# Patient Record
Sex: Male | Born: 1935 | Race: Black or African American | Hispanic: No | Marital: Married | Smoking: Former smoker
Health system: Southern US, Community
[De-identification: ages and names within clinical notes are randomized; demographics above are authoritative.]

## PROBLEM LIST (undated history)

## (undated) DIAGNOSIS — E785 Hyperlipidemia, unspecified: Secondary | ICD-10-CM

## (undated) DIAGNOSIS — J309 Allergic rhinitis, unspecified: Secondary | ICD-10-CM

## (undated) DIAGNOSIS — N259 Disorder resulting from impaired renal tubular function, unspecified: Secondary | ICD-10-CM

## (undated) DIAGNOSIS — Z86010 Personal history of colonic polyps: Secondary | ICD-10-CM

## (undated) DIAGNOSIS — N400 Enlarged prostate without lower urinary tract symptoms: Secondary | ICD-10-CM

## (undated) DIAGNOSIS — F528 Other sexual dysfunction not due to a substance or known physiological condition: Secondary | ICD-10-CM

## (undated) DIAGNOSIS — L989 Disorder of the skin and subcutaneous tissue, unspecified: Secondary | ICD-10-CM

## (undated) DIAGNOSIS — M109 Gout, unspecified: Secondary | ICD-10-CM

## (undated) DIAGNOSIS — I10 Essential (primary) hypertension: Secondary | ICD-10-CM

## (undated) HISTORY — PX: OTHER SURGICAL HISTORY: SHX169

## (undated) HISTORY — DX: Gout, unspecified: M10.9

## (undated) HISTORY — DX: Other sexual dysfunction not due to a substance or known physiological condition: F52.8

## (undated) HISTORY — DX: Allergic rhinitis, unspecified: J30.9

## (undated) HISTORY — DX: Disorder resulting from impaired renal tubular function, unspecified: N25.9

## (undated) HISTORY — DX: Hyperlipidemia, unspecified: E78.5

## (undated) HISTORY — DX: Benign prostatic hyperplasia without lower urinary tract symptoms: N40.0

## (undated) HISTORY — DX: Personal history of colonic polyps: Z86.010

## (undated) HISTORY — DX: Essential (primary) hypertension: I10

## (undated) HISTORY — DX: Disorder of the skin and subcutaneous tissue, unspecified: L98.9

---

## 1998-03-16 ENCOUNTER — Encounter: Payer: Self-pay | Admitting: Gastroenterology

## 2000-04-30 ENCOUNTER — Other Ambulatory Visit: Admission: RE | Admit: 2000-04-30 | Discharge: 2000-04-30 | Payer: Self-pay | Admitting: Gastroenterology

## 2000-04-30 ENCOUNTER — Encounter (INDEPENDENT_AMBULATORY_CARE_PROVIDER_SITE_OTHER): Payer: Self-pay | Admitting: Specialist

## 2000-04-30 ENCOUNTER — Encounter: Payer: Self-pay | Admitting: Gastroenterology

## 2000-05-01 ENCOUNTER — Encounter: Payer: Self-pay | Admitting: Gastroenterology

## 2004-11-27 ENCOUNTER — Ambulatory Visit: Payer: Self-pay | Admitting: Internal Medicine

## 2005-05-28 ENCOUNTER — Ambulatory Visit: Payer: Self-pay | Admitting: Internal Medicine

## 2005-06-14 ENCOUNTER — Ambulatory Visit: Payer: Self-pay | Admitting: Gastroenterology

## 2005-06-28 ENCOUNTER — Ambulatory Visit: Payer: Self-pay | Admitting: Gastroenterology

## 2005-12-03 ENCOUNTER — Ambulatory Visit: Payer: Self-pay | Admitting: Internal Medicine

## 2005-12-31 ENCOUNTER — Ambulatory Visit: Payer: Self-pay | Admitting: Internal Medicine

## 2006-05-07 ENCOUNTER — Ambulatory Visit: Payer: Self-pay | Admitting: Internal Medicine

## 2006-05-07 LAB — CONVERTED CEMR LAB
Albumin: 3.3 g/dL — ABNORMAL LOW (ref 3.5–5.2)
Alkaline Phosphatase: 41 units/L (ref 39–117)
Basophils Absolute: 0 10*3/uL (ref 0.0–0.1)
CO2: 26 meq/L (ref 19–32)
Creatinine, Ser: 1.5 mg/dL (ref 0.5–1.7)
Eosinophils Absolute: 0.1 10*3/uL (ref 0.0–0.6)
GFR calc non Af Amer: 48 mL/min
Glucose, Bld: 91 mg/dL (ref 70–99)
Hemoglobin: 12.8 g/dL — ABNORMAL LOW (ref 13.0–17.0)
LDL Cholesterol: 86 mg/dL (ref 0–99)
MCHC: 34.1 g/dL (ref 30.0–36.0)
Monocytes Relative: 14.5 % — ABNORMAL HIGH (ref 3.0–11.0)
Neutro Abs: 1.5 10*3/uL (ref 1.4–7.7)
Neutrophils Relative %: 41.8 % — ABNORMAL LOW (ref 43.0–77.0)
Platelets: 124 10*3/uL — ABNORMAL LOW (ref 150–400)
RBC: 4.14 M/uL — ABNORMAL LOW (ref 4.22–5.81)
RDW: 13.9 % (ref 11.5–14.6)
Sodium: 144 meq/L (ref 135–145)
Total Bilirubin: 0.6 mg/dL (ref 0.3–1.2)
Total CHOL/HDL Ratio: 3
Triglycerides: 43 mg/dL (ref 0–149)
VLDL: 9 mg/dL (ref 0–40)

## 2006-05-14 ENCOUNTER — Ambulatory Visit: Payer: Self-pay | Admitting: Internal Medicine

## 2006-11-18 ENCOUNTER — Ambulatory Visit: Payer: Self-pay | Admitting: Internal Medicine

## 2007-05-21 ENCOUNTER — Ambulatory Visit: Payer: Self-pay | Admitting: Internal Medicine

## 2007-05-21 LAB — CONVERTED CEMR LAB
ALT: 9 units/L (ref 0–53)
Alkaline Phosphatase: 45 units/L (ref 39–117)
BUN: 21 mg/dL (ref 6–23)
Basophils Relative: 0.6 % (ref 0.0–1.0)
Bilirubin Urine: NEGATIVE
CO2: 25 meq/L (ref 19–32)
Calcium: 8.8 mg/dL (ref 8.4–10.5)
Creatinine, Ser: 1.5 mg/dL (ref 0.4–1.5)
Hemoglobin: 12.8 g/dL — ABNORMAL LOW (ref 13.0–17.0)
Leukocytes, UA: NEGATIVE
Monocytes Relative: 12.9 % — ABNORMAL HIGH (ref 3.0–11.0)
Platelets: 132 10*3/uL — ABNORMAL LOW (ref 150–400)
RDW: 14.1 % (ref 11.5–14.6)
Specific Gravity, Urine: 1.025 (ref 1.000–1.03)
Total Bilirubin: 0.6 mg/dL (ref 0.3–1.2)
Total Protein, Urine: NEGATIVE mg/dL
Total Protein: 7.4 g/dL (ref 6.0–8.3)
Triglycerides: 58 mg/dL (ref 0–149)
Urine Glucose: NEGATIVE mg/dL
VLDL: 12 mg/dL (ref 0–40)
pH: 5.5 (ref 5.0–8.0)

## 2007-05-22 DIAGNOSIS — F528 Other sexual dysfunction not due to a substance or known physiological condition: Secondary | ICD-10-CM

## 2007-05-22 DIAGNOSIS — I10 Essential (primary) hypertension: Secondary | ICD-10-CM | POA: Insufficient documentation

## 2007-05-22 DIAGNOSIS — J309 Allergic rhinitis, unspecified: Secondary | ICD-10-CM

## 2007-05-22 DIAGNOSIS — N400 Enlarged prostate without lower urinary tract symptoms: Secondary | ICD-10-CM | POA: Insufficient documentation

## 2007-05-22 DIAGNOSIS — E785 Hyperlipidemia, unspecified: Secondary | ICD-10-CM | POA: Insufficient documentation

## 2007-05-22 HISTORY — DX: Benign prostatic hyperplasia without lower urinary tract symptoms: N40.0

## 2007-05-22 HISTORY — DX: Essential (primary) hypertension: I10

## 2007-05-22 HISTORY — DX: Other sexual dysfunction not due to a substance or known physiological condition: F52.8

## 2007-05-22 HISTORY — DX: Hyperlipidemia, unspecified: E78.5

## 2007-05-22 HISTORY — DX: Allergic rhinitis, unspecified: J30.9

## 2007-11-19 ENCOUNTER — Ambulatory Visit: Payer: Self-pay | Admitting: Internal Medicine

## 2007-11-19 DIAGNOSIS — Z860100 Personal history of colon polyps, unspecified: Secondary | ICD-10-CM

## 2007-11-19 HISTORY — DX: Personal history of colon polyps, unspecified: Z86.0100

## 2007-11-19 HISTORY — DX: Personal history of colonic polyps: Z86.010

## 2007-12-12 ENCOUNTER — Encounter: Payer: Self-pay | Admitting: Internal Medicine

## 2008-02-24 ENCOUNTER — Encounter: Payer: Self-pay | Admitting: Internal Medicine

## 2008-05-12 ENCOUNTER — Ambulatory Visit: Payer: Self-pay | Admitting: Internal Medicine

## 2008-05-12 LAB — CONVERTED CEMR LAB
AST: 18 units/L (ref 0–37)
Alkaline Phosphatase: 43 units/L (ref 39–117)
BUN: 21 mg/dL (ref 6–23)
Basophils Absolute: 0 10*3/uL (ref 0.0–0.1)
Bilirubin Urine: NEGATIVE
Bilirubin, Direct: 0.1 mg/dL (ref 0.0–0.3)
Chloride: 111 meq/L (ref 96–112)
Cholesterol: 141 mg/dL (ref 0–200)
Eosinophils Absolute: 0.2 10*3/uL (ref 0.0–0.7)
Eosinophils Relative: 5.2 % — ABNORMAL HIGH (ref 0.0–5.0)
GFR calc non Af Amer: 48 mL/min
LDL Cholesterol: 80 mg/dL (ref 0–99)
MCV: 91.6 fL (ref 78.0–100.0)
Neutrophils Relative %: 42.9 % — ABNORMAL LOW (ref 43.0–77.0)
Platelets: 121 10*3/uL — ABNORMAL LOW (ref 150–400)
Potassium: 5.3 meq/L — ABNORMAL HIGH (ref 3.5–5.1)
RDW: 14.2 % (ref 11.5–14.6)
Sodium: 141 meq/L (ref 135–145)
Total Bilirubin: 0.6 mg/dL (ref 0.3–1.2)
Urine Glucose: NEGATIVE mg/dL
Urobilinogen, UA: 0.2 (ref 0.0–1.0)
VLDL: 11 mg/dL (ref 0–40)
WBC: 3.8 10*3/uL — ABNORMAL LOW (ref 4.5–10.5)

## 2008-05-18 ENCOUNTER — Ambulatory Visit: Payer: Self-pay | Admitting: Internal Medicine

## 2008-07-02 ENCOUNTER — Telehealth: Payer: Self-pay | Admitting: Internal Medicine

## 2008-11-18 ENCOUNTER — Ambulatory Visit: Payer: Self-pay | Admitting: Internal Medicine

## 2009-02-25 ENCOUNTER — Telehealth: Payer: Self-pay | Admitting: Internal Medicine

## 2009-05-13 ENCOUNTER — Ambulatory Visit: Payer: Self-pay | Admitting: Internal Medicine

## 2009-05-13 LAB — CONVERTED CEMR LAB
Alkaline Phosphatase: 42 units/L (ref 39–117)
BUN: 27 mg/dL — ABNORMAL HIGH (ref 6–23)
Basophils Relative: 0.4 % (ref 0.0–3.0)
Bilirubin Urine: NEGATIVE
Bilirubin, Direct: 0.1 mg/dL (ref 0.0–0.3)
Chloride: 111 meq/L (ref 96–112)
Eosinophils Absolute: 0.2 10*3/uL (ref 0.0–0.7)
Eosinophils Relative: 4.4 % (ref 0.0–5.0)
Glucose, Bld: 89 mg/dL (ref 70–99)
HDL: 56.8 mg/dL (ref >39.00)
LDL Cholesterol: 86 mg/dL (ref 0–99)
Leukocytes, UA: NEGATIVE
Lymphocytes Relative: 36.9 % (ref 12.0–46.0)
MCHC: 34.2 g/dL (ref 30.0–36.0)
Monocytes Absolute: 0.5 10*3/uL (ref 0.1–1.0)
Neutrophils Relative %: 46.2 % (ref 43.0–77.0)
Nitrite: NEGATIVE
PSA: 0.54 ng/mL (ref 0.10–4.00)
Platelets: 104 10*3/uL — ABNORMAL LOW (ref 150.0–400.0)
Potassium: 4.5 meq/L (ref 3.5–5.1)
RBC: 4.06 M/uL — ABNORMAL LOW (ref 4.22–5.81)
Sodium: 141 meq/L (ref 135–145)
Specific Gravity, Urine: 1.025 (ref 1.000–1.030)
Total Bilirubin: 0.8 mg/dL (ref 0.3–1.2)
Total Protein, Urine: NEGATIVE mg/dL
Total Protein: 7.2 g/dL (ref 6.0–8.3)
VLDL: 9.8 mg/dL (ref 0.0–40.0)
WBC: 3.8 10*3/uL — ABNORMAL LOW (ref 4.5–10.5)
pH: 5 (ref 5.0–8.0)

## 2009-05-19 ENCOUNTER — Ambulatory Visit: Payer: Self-pay | Admitting: Internal Medicine

## 2009-11-18 ENCOUNTER — Ambulatory Visit: Payer: Self-pay | Admitting: Internal Medicine

## 2009-11-18 DIAGNOSIS — N189 Chronic kidney disease, unspecified: Secondary | ICD-10-CM

## 2009-11-18 DIAGNOSIS — N259 Disorder resulting from impaired renal tubular function, unspecified: Secondary | ICD-10-CM

## 2009-11-18 DIAGNOSIS — N184 Chronic kidney disease, stage 4 (severe): Secondary | ICD-10-CM | POA: Insufficient documentation

## 2009-11-18 HISTORY — DX: Disorder resulting from impaired renal tubular function, unspecified: N25.9

## 2009-11-19 ENCOUNTER — Encounter: Payer: Self-pay | Admitting: Internal Medicine

## 2009-11-19 LAB — CONVERTED CEMR LAB
CO2: 16 meq/L — ABNORMAL LOW (ref 19–32)
Chloride: 111 meq/L (ref 96–112)
HDL: 49 mg/dL (ref >39)
LDL Cholesterol: 79 mg/dL (ref 0–99)
Potassium: 4.3 meq/L (ref 3.5–5.3)
Sodium: 144 meq/L (ref 135–145)
Total CHOL/HDL Ratio: 2.9
Triglycerides: 65 mg/dL (ref <150)
VLDL: 13 mg/dL (ref 0–40)

## 2010-04-28 ENCOUNTER — Encounter (INDEPENDENT_AMBULATORY_CARE_PROVIDER_SITE_OTHER): Payer: Self-pay | Admitting: *Deleted

## 2010-05-12 ENCOUNTER — Ambulatory Visit: Payer: Self-pay | Admitting: Internal Medicine

## 2010-05-12 LAB — CONVERTED CEMR LAB
AST: 14 units/L (ref 0–37)
Alkaline Phosphatase: 41 units/L (ref 39–117)
Basophils Absolute: 0 10*3/uL (ref 0.0–0.1)
Basophils Relative: 1.2 % (ref 0.0–3.0)
Bilirubin Urine: NEGATIVE
Bilirubin, Direct: 0.1 mg/dL (ref 0.0–0.3)
CO2: 23 meq/L (ref 19–32)
Calcium: 8.4 mg/dL (ref 8.4–10.5)
Creatinine, Ser: 1.5 mg/dL (ref 0.4–1.5)
Eosinophils Absolute: 0.1 10*3/uL (ref 0.0–0.7)
GFR calc non Af Amer: 56.16 mL/min (ref >60)
HDL: 53.5 mg/dL (ref >39.00)
Hemoglobin: 12.2 g/dL — ABNORMAL LOW (ref 13.0–17.0)
Ketones, ur: NEGATIVE mg/dL
LDL Cholesterol: 69 mg/dL (ref 0–99)
Leukocytes, UA: NEGATIVE
Lymphocytes Relative: 33.9 % (ref 12.0–46.0)
MCHC: 34.4 g/dL (ref 30.0–36.0)
Monocytes Relative: 12.6 % — ABNORMAL HIGH (ref 3.0–12.0)
Neutrophils Relative %: 48.2 % (ref 43.0–77.0)
RBC: 3.91 M/uL — ABNORMAL LOW (ref 4.22–5.81)
Sodium: 142 meq/L (ref 135–145)
Total CHOL/HDL Ratio: 2
Total Protein: 6.7 g/dL (ref 6.0–8.3)
Triglycerides: 52 mg/dL (ref 0.0–149.0)
Urobilinogen, UA: 0.2 (ref 0.0–1.0)
WBC: 3.6 10*3/uL — ABNORMAL LOW (ref 4.5–10.5)

## 2010-05-18 ENCOUNTER — Ambulatory Visit: Payer: Self-pay | Admitting: Internal Medicine

## 2010-05-23 ENCOUNTER — Encounter (INDEPENDENT_AMBULATORY_CARE_PROVIDER_SITE_OTHER): Payer: Self-pay | Admitting: *Deleted

## 2010-07-12 ENCOUNTER — Ambulatory Visit: Payer: Self-pay | Admitting: Gastroenterology

## 2010-10-10 ENCOUNTER — Encounter (INDEPENDENT_AMBULATORY_CARE_PROVIDER_SITE_OTHER): Payer: Self-pay | Admitting: *Deleted

## 2010-11-16 ENCOUNTER — Ambulatory Visit
Admission: RE | Admit: 2010-11-16 | Discharge: 2010-11-16 | Payer: Self-pay | Source: Home / Self Care | Attending: Internal Medicine | Admitting: Internal Medicine

## 2010-11-16 ENCOUNTER — Ambulatory Visit: Admission: RE | Admit: 2010-11-16 | Payer: Self-pay | Source: Home / Self Care | Admitting: Internal Medicine

## 2010-11-16 DIAGNOSIS — L989 Disorder of the skin and subcutaneous tissue, unspecified: Secondary | ICD-10-CM | POA: Insufficient documentation

## 2010-11-16 HISTORY — DX: Disorder of the skin and subcutaneous tissue, unspecified: L98.9

## 2010-11-22 ENCOUNTER — Ambulatory Visit
Admission: RE | Admit: 2010-11-22 | Discharge: 2010-11-22 | Payer: Self-pay | Source: Home / Self Care | Attending: Gastroenterology | Admitting: Gastroenterology

## 2010-11-22 ENCOUNTER — Encounter: Payer: Self-pay | Admitting: Gastroenterology

## 2010-12-05 NOTE — Progress Notes (Signed)
Summary: lovastatin  Phone Note From Pharmacy   [facility]  [address] [phone]* Reason for Call: Needs renewal Summary of Call: Requesting renewal on Lovastatin 20mg  # 30 take 1 by mouth once daily. Last filled 05/24/2008 Initial call taken by: Orlan Leavens,  July 02, 2008 9:55 AM  Follow-up for Phone Call        Sent renewal into pharm. Follow-up by: Orlan Leavens,  July 02, 2008 9:57 AM      Prescriptions: MEVACOR 20 MG  TABS (LOVASTATIN) 1 by mouth once daily  #30 x 10   Entered by:   Orlan Leavens   Authorized by:   Corwin Levins MD   Signed by:   Orlan Leavens on 07/02/2008   Method used:   [facility]        [facility]  [address] [phone]* (retail)       [address]       [place], [place]  [address]       Ph: [phone] or [phone]       Fax: [phone]   RxID:   [id number]   Appended Document: lovastatin & lisinopril    Clinical Lists Changes  Medications: Rx of LISINOPRIL 40 MG  TABS (LISINOPRIL) 1 by mouth once daily;  #30 x 10;  Signed;  Entered by: Orlan Leavens;  Authorized by: Corwin Levins MD;  Method used: [facility] [address] [phone]*, [address], [place], [place], [place]  [address], Ph: [phone] or [phone], Fax: [phone]    Prescriptions: LISINOPRIL 40 MG  TABS (LISINOPRIL) 1 by mouth once daily  #30 x 10   Entered by:   Orlan Leavens   Authorized by:   Corwin Levins MD   Signed by:   Orlan Leavens on 07/02/2008   Method used:   [facility]ically to        [facility]  [address] [phone]* (retail)       [address]       [place], [place]  [address]       Ph: [phone] or [phone]       Fax: [phone]   RxID:   [id number]

## 2010-12-05 NOTE — Procedures (Signed)
Summary: Colonoscopy   Colonoscopy  Procedure date:  06/28/2005  Findings:      Results: Hemorrhoids.     Location:  [facility].    Procedures Next Due Date:    Colonoscopy: 07/2010  Colonoscopy  Procedure date:  06/28/2005  Findings:      Results: Hemorrhoids.     Location:  [place].    Procedures Next Due Date:    Colonoscopy: 07/2010 Patient Name: Francico, Palk MRN:  Procedure Procedures: Colonoscopy CPT: [id number].  Personnel: Endoscopist: Pricilla Riffle. Fuller Plan, MD, Marval Regal.  Exam Location: Exam performed in Outpatient Clinic. Outpatient  Patient Consent: Procedure, Alternatives, Risks and Benefits discussed, consent obtained, from patient. Consent was obtained by the RN.  Indications  Surveillance of: Adenomatous Polyp(s). Initial polypectomy was performed in 2001. in Jun. Pathology of worst  polyp: tubular adenoma.  History  Current Medications: Patient is not currently taking Coumadin.  Pre-Exam Physical: Performed Jun 28, 2005. Cardio-pulmonary exam, Rectal exam, HEENT exam , Abdominal exam, Mental status exam WNL.  Exam Exam: Extent of exam reached: Cecum, extent intended: Cecum.  The cecum was identified by appendiceal orifice and IC valve. Colon retroflexion performed. ASA Classification: II. Tolerance: excellent.  Monitoring: Pulse and BP monitoring, Oximetry used. Supplemental O2 given.  Colon Prep Used Glycolax for colon prep. Prep results: good.  Sedation Meds: Patient assessed and found to be appropriate for moderate (conscious) sedation. Fentanyl 75 mcg. given IV. Versed 8 mg. given IV.  Findings NORMAL EXAM: Cecum to Sigmoid Colon.  HEMORRHOIDS: Internal. Size: Medium. Not bleeding. Not thrombosed. ICD9: Hemorrhoids, Internal: 455.0.   Assessment  Diagnoses: 455.0: Hemorrhoids, Internal.   Events  Unplanned Interventions: No intervention was required.  Unplanned Events: There were no  complications. Plans Patient Education: Patient given standard instructions for: Hemorrhoids.  Disposition: After procedure patient sent to recovery. After recovery patient sent home.  Scheduling/Referral: Colonoscopy, to [facility] T. Fuller Plan, MD, [facility], around Jun 28, 2010.  Primary Care Provider, to Biagio Borg, MD,    This report was created from the original endoscopy report, which was reviewed and signed by the above listed endoscopist.    cc: Biagio Borg, MD

## 2010-12-05 NOTE — Progress Notes (Signed)
Summary: hctz  Phone Note Refill Request Message from:  Fax from Pharmacy on February 25, 2009 10:15 AM  Refills Requested: Medication #1:  HYDROCHLOROTHIAZIDE 12.5 MG  TABS 1 by mouth once daily.   Dosage confirmed as above?[facility] [address]  Initial call taken by: Windell Norfolk,  February 25, 2009 10:15 AM      Prescriptions: HYDROCHLOROTHIAZIDE 12.5 MG  TABS (HYDROCHLOROTHIAZIDE) 1 by mouth once daily  #30 x 5   Entered by:   Windell Norfolk   Authorized by:   Corwin Levins MD   Signed by:   Windell Norfolk on 02/25/2009   Method used:   [facility] to        [facility]  [address] [phone]* (retail)       [address]       [place], [place]  [id number]       Ph: [phone] or [phone]       Fax: [phone]   RxID:   [id number]

## 2010-12-05 NOTE — Assessment & Plan Note (Signed)
Summary: 6 mth fu  stc   Vital Signs:  Patient profile:   77 year old male Height:      69 inches Weight:      179 pounds BMI:     26.53 O2 Sat:      98 % on Room air Temp:     97.8 degrees F oral Pulse rate:   69 / minute BP sitting:   120 / 72  (left arm) Cuff size:   regular  Vitals Entered By: Shirlean Mylar Ewing CMA (AAMA) (May 18, 2010 8:16 AM)  O2 Flow:  Room air  CC: 6 month followup/RE   CC:  6 month followup/RE.  History of Present Illness: overall doing well,  Pt denies CP, sob, doe, wheezing, orthopnea, pnd, worsening LE edema, palps, dizziness or syncope  Pt denies new neuro symptoms such as headache, facial or extremity weakness  No fever, wt loss, night sweats, or other constitutional symptoms   Preventive Screening-Counseling & Management      Drug Use:  no.    Problems Prior to Update: 1)  Renal Insufficiency  (ICD-588.9) 2)  Preventive Health Care  (ICD-V70.0) 3)  Preventive Health Care  (ICD-V70.0) 4)  Colonic Polyps, Hx of  (ICD-V12.72) 5)  Allergic Rhinitis  (ICD-477.9) 6)  Hyperlipidemia  (ICD-272.4) 7)  Erectile Dysfunction  (ICD-302.72) 8)  Benign Prostatic Hypertrophy  (ICD-600.00) 9)  Hypertension  (ICD-401.9)  Medications Prior to Update: 1)  Aspirin 81 Mg  Tbec (Aspirin) .[name].. 1 By Mouth Once Daily 2)  Lisinopril 40 Mg  Tabs (Lisinopril) .[name].. 1 By Mouth Once Daily 3)  Dilt-Xr 240 Mg  Cp24 (Diltiazem Hcl) .[name].. 1 By Mouth Once Daily 4)  Mevacor 20 Mg  Tabs (Lovastatin) .[name].. 1 By Mouth Once Daily 5)  Hydrochlorothiazide 12.5 Mg  Tabs (Hydrochlorothiazide) .[name].. 1 By Mouth Once Daily  Current Medications (verified): 1)  Aspirin 81 Mg  Tbec (Aspirin) .[name].. 1 By Mouth Once Daily 2)  Lisinopril 40 Mg  Tabs (Lisinopril) .[name].. 1 By Mouth Once Daily 3)  Dilt-Xr 240 Mg  Cp24 (Diltiazem Hcl) .[name].. 1 By Mouth Once Daily 4)  Mevacor 20 Mg  Tabs (Lovastatin) .[name].. 1 By Mouth Once Daily 5)  Hydrochlorothiazide 12.5 Mg  Tabs (Hydrochlorothiazide) .[name].. 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         all otherwise negative per pt -    Physical Exam  General:  alert and well-developed.  Head:  normocephalic and atraumatic.   Eyes:  vision grossly intact, pupils equal, and pupils round.   Ears:  R ear normal and L ear normal.   Nose:  no external deformity and no nasal discharge.   Mouth:  no gingival abnormalities and pharynx pink and moist.   Neck:  supple and no masses.   Lungs:  normal respiratory effort and normal breath sounds.   Heart:  normal rate and regular rhythm.   Abdomen:  soft, non-tender, and normal bowel sounds.   Msk:  no acute  joint tenderness and no joint swelling.   Extremities:  no edema, no erythema  Neurologic:  cranial nerves II-XII intact and strength normal in all extremities.     Impression & Recommendations:  Problem # 1:  Preventive Health Care (ICD-V70.0) Overall doing well, age appropriate education and counseling updated and referral for appropriate preventive services done unless declined, immunizations up to date or declined, diet counseling done if overweight, urged to quit smoking if smokes , most recent labs reviewed and current ordered if appropriate, ecg reviewed or declined (interpretation per ECG scanned in the EMR if done); information regarding Medicare Prevention requirements given if appropriate; speciality referrals updated as appropriate   Complete Medication List: 1)  Aspirin 81 Mg Tbec (Aspirin) .[name].. 1 by mouth once daily 2)  Lisinopril 40 Mg Tabs (Lisinopril) .[name].. 1 by mouth once daily 3)  Dilt-xr 240 Mg Cp24 (Diltiazem hcl) .[name].. 1 by mouth once daily 4)  Mevacor 20 Mg Tabs (Lovastatin) .[name].. 1 by mouth once daily 5)  Hydrochlorothiazide 12.5 Mg Tabs (Hydrochlorothiazide) .[name].. 1 by mouth once daily  Patient Instructions: 1)  Continue all previous medications as before this visit  2)  Please schedule a follow-up appointment in 1 year or sooner if needed

## 2010-12-05 NOTE — Letter (Signed)
Summary: New Patient letter  [facility]  [place], [place] [address]   Phone: [phone]  Fax: [phone]       05/23/2010 MRN: [id number]  Devin Hogan [address] [place], [place]  [address]  Dear Devin Hogan,  Welcome to the Gastroenterology Division at [facility].    You are scheduled to see Dr. Fuller Plan on 07/12/2010 at 10:00AM on the 3rd floor at [facility], [facility] [employer].  We ask that you try to arrive at our office 15 minutes prior to your appointment time to allow for check-in.  We would like you to complete the enclosed self-administered evaluation form prior to your visit and bring it with you on the day of your appointment.  We will review it with you.  Also, please bring a complete list of all your medications or, if you prefer, bring the medication bottles and we will list them.  Please bring your insurance card so that we may make a copy of it.  If your insurance requires a referral to see a specialist, please bring your referral form from your primary care physician.  Co-payments are due at the time of your visit and may be paid by cash, check or credit card.     Your office visit will consist of a consult with your physician (includes a physical exam), any laboratory testing he/she may order, scheduling of any necessary diagnostic testing (e.g. x-ray, ultrasound, CT-scan), and scheduling of a procedure (e.g. Endoscopy, Colonoscopy) if required.  Please allow enough time on your schedule to allow for any/all of these possibilities.    If you cannot keep your appointment, please call [phone] to cancel or reschedule prior to your appointment date.  This allows [place] the opportunity to schedule an appointment for another patient in need of care.  If you do not cancel or reschedule by 5 p.m. the business day prior to your appointment date, you will be charged a $50.00 late cancellation/no-show fee.    Thank you for choosing [facility] for your medical needs.  We appreciate the opportunity to care for you.  Please visit [place] at our website  to learn more about our practice.                     Sincerely,                                                             The Gastroenterology Division

## 2010-12-05 NOTE — Assessment & Plan Note (Signed)
Summary: CPX/NML   Vital Signs:  Patient Profile:   76 Years Old Male Weight:      188 pounds Temp:     97.3 degrees F oral Pulse rate:   54 / minute BP sitting:   110 / 78  (right arm) Cuff size:   regular  Vitals Entered By: Jerilynn Mages MA(May 18, 2008 8:40 AM)                  Chief Complaint:  CPX.  History of Present Illness: overall doing ok, no comlaints    Updated Prior Medication List: ASPIRIN 81 MG  TBEC (ASPIRIN) 1 by mouth once daily LISINOPRIL 40 MG  TABS (LISINOPRIL) 1 by mouth once daily DILT-XR 240 MG  CP24 (DILTIAZEM HCL) 1 by mouth once daily MEVACOR 20 MG  TABS (LOVASTATIN) 1 by mouth once daily HYDROCHLOROTHIAZIDE 12.5 MG  TABS (HYDROCHLOROTHIAZIDE) 1 by mouth once daily  Current Allergies (reviewed today): No known allergies   Past Medical History:    Reviewed history from 11/19/2007 and no changes required:       Hypertension       Benign prostatic hypertrophy       Erectile Dysfunction       Hyperlipidemia       Allergic rhinitis       Colonic polyps, hx of  Past Surgical History:    Reviewed history from 11/19/2007 and no changes required:       Denies surgical history   Family History:    Reviewed history from 11/19/2007 and no changes required:       brother died with pneumonia, storke  Social History:    Reviewed history from 11/19/2007 and no changes required:       Former Smoker       Alcohol use-yes       retired Hydrologist       Married       5 children    Review of Systems  The patient denies anorexia, fever, weight loss, weight gain, vision loss, decreased hearing, hoarseness, chest pain, syncope, dyspnea on exertion, peripheral edema, prolonged cough, headaches, hemoptysis, abdominal pain, melena, hematochezia, severe indigestion/heartburn, hematuria, incontinence, genital sores, muscle weakness, suspicious skin lesions, transient blindness, difficulty walking, depression, unusual weight change, abnormal  bleeding, enlarged lymph nodes, angioedema, breast masses, and testicular masses.         all otherwise negative    Physical Exam  General:     Well-developed,well-nourished,in no acute distress; alert,appropriate and cooperative throughout examination Head:     Normocephalic and atraumatic without obvious abnormalities. No apparent alopecia or balding. Eyes:     No corneal or conjunctival inflammation noted. EOMI. Perrla.  Ears:     External ear exam shows no significant lesions or deformities.  Otoscopic examination reveals clear canals, tympanic membranes are intact bilaterally without bulging, retraction, inflammation or discharge. Hearing is grossly normal bilaterally. Nose:     External nasal examination shows no deformity or inflammation. Nasal mucosa are pink and moist without lesions or exudates. Mouth:     Oral mucosa and oropharynx without lesions or exudates.  Teeth in good repair. Neck:     No deformities, masses, or tenderness noted. Lungs:     Normal respiratory effort, chest expands symmetrically. Lungs are clear to auscultation, no crackles or wheezes. Heart:     Normal rate and regular rhythm. S1 and S2 normal without gallop, murmur, click, rub or other extra sounds. Abdomen:  Bowel sounds positive,abdomen soft and non-tender without masses, organomegaly or hernias noted. Genitalia:     Testes bilaterally descended without nodularity, tenderness or masses. No scrotal masses or lesions. No penis lesions or urethral discharge. Msk:     No deformity or scoliosis noted of thoracic or lumbar spine.   Extremities:     No clubbing, cyanosis, edema, or deformity noted with normal full range of motion of all joints.   Neurologic:     No cranial nerve deficits noted. Station and gait are normal. Plantar reflexes are down-going bilaterally. DTRs are symmetrical throughout. Sensory, motor and coordinative functions appear intact.    Impression &  Recommendations:  Problem # 1:  Preventive Health Care (ICD-V70.0) Overall doing well, up to date, counseled on routine health concerns for screening and prevention, immunizations up to date or declined, labs reviewed, ecg reviewed    Orders: EKG w/ Interpretation ([address])   Complete Medication List: 1)  Aspirin 81 Mg Tbec (Aspirin) .[name].. 1 by mouth once daily 2)  Lisinopril 40 Mg Tabs (Lisinopril) .[name].. 1 by mouth once daily 3)  Dilt-xr 240 Mg Cp24 (Diltiazem hcl) .[name].. 1 by mouth once daily 4)  Mevacor 20 Mg Tabs (Lovastatin) .[name].. 1 by mouth once daily 5)  Hydrochlorothiazide 12.5 Mg Tabs (Hydrochlorothiazide) .[name].. 1 by mouth once daily   Patient Instructions: 1)  Continue all medications that you may have been taking previously 2)  Please schedule a follow-up appointment in 6 months.   ]

## 2010-12-05 NOTE — Letter (Signed)
Summary: New Patient letter  [facility]  [place], [place] [address]   Phone: [phone]  Fax: [phone]       10/10/2010 MRN: [id number]  Devin Hogan [address] [place], [place]  [address]  Dear Devin Hogan,  Welcome to the Gastroenterology Division at [facility]    You are scheduled to see Dr.  Fuller Plan on 11-22-10 at 11:00a.m. on the 3rd floor at [facility], [place] [employer].  We ask that you try to arrive at our office 15 minutes prior to your appointment time to allow for check-in.  We would like you to complete the enclosed self-administered evaluation form prior to your visit and bring it with you on the day of your appointment.  We will review it with you.  Also, please bring a complete list of all your medications or, if you prefer, bring the medication bottles and we will list them.  Please bring your insurance card so that we may make a copy of it.  If your insurance requires a referral to see a specialist, please bring your referral form from your primary care physician.  Co-payments are due at the time of your visit and may be paid by cash, check or credit card.     Your office visit will consist of a consult with your physician (includes a physical exam), any laboratory testing he/she may order, scheduling of any necessary diagnostic testing (e.g. x-ray, ultrasound, CT-scan), and scheduling of a procedure (e.g. Endoscopy, Colonoscopy) if required.  Please allow enough time on your schedule to allow for any/all of these possibilities.    If you cannot keep your appointment, please call [phone] to cancel or reschedule prior to your appointment date.  This allows [place] the opportunity to schedule an appointment for another patient in need of care.  If you do not cancel or reschedule by 5 p.m. the business day prior to your appointment date, you will be charged a $50.00 late cancellation/no-show fee.    Thank you for choosing  [facility] for your medical needs.  We appreciate the opportunity to care for you.  Please visit [place] at our website  to learn more about our practice.                     Sincerely,                                                             The Gastroenterology Division

## 2010-12-05 NOTE — Assessment & Plan Note (Signed)
Summary: 6 MO ROV /NWS $50   Vital Signs:  Patient Profile:   77 Years Old Male Weight:      183 pounds O2 Sat:      97 % O2 treatment:    Room Air Temp:     97.9 degrees F oral Pulse rate:   64 / minute BP sitting:   118 / 68  (left arm) Cuff size:   regular  Vitals Entered By: Payton Spark CMA (November 18, 2008 8:06 AM)                  Chief Complaint:  6 mo rov.  History of Present Illness: overall doing well, no complaints, no CP, sob, doe, orthpnea, pnd, or LE edema; good compliance with meds; no recent palp's, weakness , dizziness or syncope, trying to follow low chol diet, denies polydipsia or polyuria symptoms    Updated Prior Medication List: ASPIRIN 81 MG  TBEC (ASPIRIN) 1 by mouth once daily LISINOPRIL 40 MG  TABS (LISINOPRIL) 1 by mouth once daily DILT-XR 240 MG  CP24 (DILTIAZEM HCL) 1 by mouth once daily MEVACOR 20 MG  TABS (LOVASTATIN) 1 by mouth once daily HYDROCHLOROTHIAZIDE 12.5 MG  TABS (HYDROCHLOROTHIAZIDE) 1 by mouth once daily  Current Allergies (reviewed today): No known allergies   Past Medical History:    Reviewed history from 11/19/2007 and no changes required:       Hypertension       Benign prostatic hypertrophy       Erectile Dysfunction       Hyperlipidemia       Allergic rhinitis       Colonic polyps, hx of  Past Surgical History:    Reviewed history from 11/19/2007 and no changes required:       Denies surgical history   Social History:    Reviewed history from 11/19/2007 and no changes required:       Former Smoker       Alcohol use-yes       retired Hydrologist       Married       5 children    Review of Systems       all otherwise negative, denies worsening BPH symptoms   Physical Exam  General:     alert and well-developed.   Head:     Normocephalic and atraumatic without obvious abnormalities. No apparent alopecia or balding. Eyes:     No corneal or conjunctival inflammation noted. EOMI. Perrla.    Ears:     External ear exam shows no significant lesions or deformities.  Otoscopic examination reveals clear canals, tympanic membranes are intact bilaterally without bulging, retraction, inflammation or discharge. Hearing is grossly normal bilaterally. Nose:     External nasal examination shows no deformity or inflammation. Nasal mucosa are pink and moist without lesions or exudates. Mouth:     Oral mucosa and oropharynx without lesions or exudates.  Teeth in good repair. Neck:     No deformities, masses, or tenderness noted. Lungs:     Normal respiratory effort, chest expands symmetrically. Lungs are clear to auscultation, no crackles or wheezes. Heart:     Normal rate and regular rhythm. S1 and S2 normal without gallop, murmur, click, rub or other extra sounds. Extremities:     no edema, no ulcers     Impression & Recommendations:  Problem # 1:  HYPERTENSION (ICD-401.9)  His updated medication list for this problem includes:    Lisinopril 40  Mg Tabs (Lisinopril) .[name]... 1 by mouth once daily    Dilt-xr 240 Mg Cp24 (Diltiazem hcl) .[name]... 1 by mouth once daily    Hydrochlorothiazide 12.5 Mg Tabs (Hydrochlorothiazide) .[name]... 1 by mouth once daily  BP today: 118/68 Prior BP: 110/78 (05/18/2008)  Labs Reviewed: Creat: 1.5 (05/12/2008) Chol: 141 (05/12/2008)   HDL: 50.4 (05/12/2008)   LDL: 80 (05/12/2008)   TG: 54 (05/12/2008) stable overall by hx and exam, ok to continue meds/tx as is   Problem # 2:  HYPERLIPIDEMIA (ICD-272.4)  His updated medication list for this problem includes:    Mevacor 20 Mg Tabs (Lovastatin) .[name]... 1 by mouth once daily  Labs Reviewed: Chol: 141 (05/12/2008)   HDL: 50.4 (05/12/2008)   LDL: 80 (05/12/2008)   TG: 54 (05/12/2008) SGOT: 18 (05/12/2008)   SGPT: 11 (05/12/2008) stable overall by hx and exam, ok to continue meds/tx as is   Problem # 3:  BENIGN PROSTATIC HYPERTROPHY (ICD-600.00) stable overall by hx and exam, ok to continue meds/tx as is - to  cont as is, seems low risk for UTI at this time  Complete Medication List: 1)  Aspirin 81 Mg Tbec (Aspirin) .[name].. 1 by mouth once daily 2)  Lisinopril 40 Mg Tabs (Lisinopril) .[name].. 1 by mouth once daily 3)  Dilt-xr 240 Mg Cp24 (Diltiazem hcl) .[name].. 1 by mouth once daily 4)  Mevacor 20 Mg Tabs (Lovastatin) .[name].. 1 by mouth once daily 5)  Hydrochlorothiazide 12.5 Mg Tabs (Hydrochlorothiazide) .[name].. 1 by mouth once daily   Patient Instructions: 1)  you received the tetanus shot today 2)  Continue all medications that you may have been taking previously  3)  Please schedule a follow-up appointment in 6 months with prior CPX labs  Appended Document: 6 MO ROV /NWS $50    Nurse Visit   Vitals Entered By: Payton Spark CMA (November 18, 2008 8:50 AM)                 Prior Medications: ASPIRIN 81 MG  TBEC (ASPIRIN) 1 by mouth once daily LISINOPRIL 40 MG  TABS (LISINOPRIL) 1 by mouth once daily DILT-XR 240 MG  CP24 (DILTIAZEM HCL) 1 by mouth once daily MEVACOR 20 MG  TABS (LOVASTATIN) 1 by mouth once daily HYDROCHLOROTHIAZIDE 12.5 MG  TABS (HYDROCHLOROTHIAZIDE) 1 by mouth once daily Current Allergies: No known allergies    Tetanus/Td Vaccine    Vaccine Type: Tdap    Site: right deltoid    Mfr: GlaxoSmithKline    Dose: 0.5 ml    Route: IM    Given by: Payton Spark CMA    Exp. Date: 10/09/2010    Lot #: [id number]    VIS given: 09/23/07 version given November 18, 2008.   Orders Added: 1)  Tdap => 41yrs IM [90715] 2)  Admin 1st Vaccine [email or web address]    ]

## 2010-12-05 NOTE — Procedures (Signed)
Summary: [facility]   Imported By: Phillis Knack 07/13/2010 07:56:57  _____________________________________________________________________  External Attachment:    Type:   Image     Comment:   External Document

## 2010-12-05 NOTE — Assessment & Plan Note (Signed)
Summary: 6 MO ROV / $50 /NWS   Vital Signs:  Patient profile:   77 year old male Height:      68 inches Weight:      182 pounds BMI:     27.77 O2 Sat:      95 % on Room air Temp:     65.4 degrees F oral Pulse rate:   66 / minute BP sitting:   126 / 90  (left arm) Cuff size:   regular  Vitals Entered ByShirlean Mylar Ewing (November 18, 2009 8:04 AM)  O2 Flow:  Room air  Preventive Care Screening     declined flu shot  CC: 6 MO ROV/RE   CC:  6 MO ROV/RE.  History of Present Illness: overall doing well, no complaints;  Pt denies CP, sob, doe, wheezing, orthopnea, pnd, worsening LE edema, palps, dizziness or syncope .  Overall good med complaints, no significant med intolorances today. Trying to watch his heart healthy diet.  Wt overall stable.  Denies depression.  No weak or falls.  No worsening GU symtpoms such as urinary retention type.  Problems Prior to Update: 1)  Preventive Health Care  (ICD-V70.0) 2)  Preventive Health Care  (ICD-V70.0) 3)  Colonic Polyps, Hx of  (ICD-V12.72) 4)  Allergic Rhinitis  (ICD-477.9) 5)  Hyperlipidemia  (ICD-272.4) 6)  Erectile Dysfunction  (ICD-302.72) 7)  Benign Prostatic Hypertrophy  (ICD-600.00) 8)  Hypertension  (ICD-401.9)  Medications Prior to Update: 1)  Aspirin 81 Mg  Tbec (Aspirin) .[name].. 1 By Mouth Once Daily 2)  Lisinopril 40 Mg  Tabs (Lisinopril) .[name].. 1 By Mouth Once Daily 3)  Dilt-Xr 240 Mg  Cp24 (Diltiazem Hcl) .[name].. 1 By Mouth Once Daily 4)  Mevacor 20 Mg  Tabs (Lovastatin) .[name].. 1 By Mouth Once Daily 5)  Hydrochlorothiazide 12.5 Mg  Tabs (Hydrochlorothiazide) .[name].. 1 By Mouth Once Daily  Current Medications (verified): 1)  Aspirin 81 Mg  Tbec (Aspirin) .[name].. 1 By Mouth Once Daily 2)  Lisinopril 40 Mg  Tabs (Lisinopril) .[name].. 1 By Mouth Once Daily 3)  Dilt-Xr 240 Mg  Cp24 (Diltiazem Hcl) .[name].. 1 By Mouth Once Daily 4)  Mevacor 20 Mg  Tabs (Lovastatin) .[name].. 1 By Mouth Once Daily 5)  Hydrochlorothiazide 12.5 Mg  Tabs  (Hydrochlorothiazide) .[name].. 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   Head:  normocephalic and atraumatic.   Eyes:  vision grossly intact, pupils equal, and pupils round.   Ears:  R ear normal and L ear normal.   Nose:  no external deformity and no nasal discharge.   Mouth:  no gingival abnormalities and pharynx pink and moist.   Neck:  supple and no masses.   Lungs:  normal respiratory effort and normal breath sounds.   Heart:  normal rate and regular rhythm.   Extremities:  no edema, no erythema    Impression & Recommendations:  Problem # 1:  RENAL INSUFFICIENCY (ICD-588.9)  to f/u today but overall exam is stable  Orders: TLB-BMP (Basic Metabolic Panel-BMET) (99991111)  Problem # 2:  HYPERLIPIDEMIA (B2193296.4)  His updated medication list for this problem includes:    Mevacor 20 Mg Tabs (Lovastatin) .[name]... 1 by mouth once daily  Labs Reviewed: SGOT: 15 (05/13/2009)   SGPT: 9 (05/13/2009)   HDL:56.80 (05/13/2009), 50.4 (05/12/2008)  LDL:86 (05/13/2009), 80 (05/12/2008)  Chol:153 (05/13/2009), 141 (05/12/2008)  Trig:49.0 (05/13/2009), 54 (05/12/2008) stable overall by hx and exam, ok to continue meds/tx as is, Pt to continue diet efforts, good med tolerance; to check labs - goal LDL less than 70   Orders: TLB-Lipid Panel (80061-[id number])  Problem # 3:  HYPERTENSION (ICD-401.9)  His updated  medication list for this problem includes:    Lisinopril 40 Mg Tabs (Lisinopril) .[name]... 1 by mouth once daily    Dilt-xr 240 Mg Cp24 (Diltiazem hcl) .[name]... 1 by mouth once daily    Hydrochlorothiazide 12.5 Mg Tabs (Hydrochlorothiazide) .[name]... 1 by mouth once daily stable overall by hx and exam, ok to continue meds/tx as is   BP today: 126/90 Prior BP: 118/68 (05/19/2009)  Labs Reviewed: K+: 4.5 (05/13/2009) Creat: : 1.8 (05/13/2009)   Chol: 153 (05/13/2009)   HDL: 56.80 (05/13/2009)   LDL: 86 (05/13/2009)   TG: 49.0 (05/13/2009)  Complete Medication List: 1)  Aspirin 81 Mg Tbec (Aspirin) .[name].. 1 by mouth once daily 2)  Lisinopril 40 Mg Tabs (Lisinopril) .[name].. 1 by mouth once daily 3)  Dilt-xr 240 Mg Cp24 (Diltiazem hcl) .[name].. 1 by mouth once daily 4)  Mevacor 20 Mg Tabs (Lovastatin) .[name].. 1 by mouth once daily 5)  Hydrochlorothiazide 12.5 Mg Tabs (Hydrochlorothiazide) .[name].. 1 by mouth once daily  Patient Instructions: 1)  Please go to the Lab in the basement for your blood tests today  2)  Continue all previous medications as before this visit  3)  Please schedule a follow-up appointment in 6 months with CPX labs  Appended Document: Orders Update    Clinical Lists Changes  Orders: Added new Test order of T- * Misc. Laboratory test [phone]) - Signed

## 2010-12-05 NOTE — Procedures (Signed)
Summary: [employer] [employer] [facility]   Imported By: Phillis Knack 07/13/2010 07:59:09  _____________________________________________________________________  External Attachment:    Type:   Image     Comment:   External Document

## 2010-12-05 NOTE — Letter (Signed)
Summary: Colonoscopy Letter  [facility]  [place], [place] [address]   Phone: [phone]  Fax: [phone]      April 28, 2010 MRN: [id number]   MARTEZ WURZER [address] [place], [place]  [address]   Dear Mr. Tyree,   According to your medical record, it is time for you to schedule a Colonoscopy. The American Cancer Society recommends this procedure as a method to detect early colon cancer. Patients with a family history of colon cancer, or a personal history of colon polyps or inflammatory bowel disease are at increased risk.  This letter has beeen generated based on the recommendations made at the time of your procedure. If you feel that in your particular situation this may no longer apply, please contact our office.  Please call our office at [phone] to schedule this appointment or to update your records at your earliest convenience.  Thank you for cooperating with [place] to provide you with the very best care possible.   Sincerely,  Norberto Sorenson T. Fuller Plan, M.D.  [facility] [phone]

## 2010-12-07 NOTE — Letter (Signed)
Summary: [facility] Instructions  [facility]  [place], [place] [address]   Phone: [phone]  Fax: [phone]       Devin Hogan    May 26, 1928    MRN: [id number]        Procedure Day Sudie Grumbling: Wednesday February 8th, 2012     Arrival Time: 12:30pm     Procedure Time: 1:30pm     Location of Procedure:                    _ x_  [place] (4th Floor)                        [place]   Starting 5 days prior to your procedure 12/08/10 do not eat nuts, seeds, popcorn, corn, beans, peas,  salads, or any raw vegetables.  Do not take any fiber supplements (e.g. Metamucil, Citrucel, and Benefiber).  THE DAY BEFORE YOUR PROCEDURE         DATE: 12/12/10  DAY: Tuesday  1.  Drink clear liquids the entire day-NO SOLID FOOD  2.  Do not drink anything colored red or purple.  Avoid juices with pulp.  No orange juice.  3.  Drink at least 64 oz. (8 glasses) of fluid/clear liquids during the day to prevent dehydration and help the prep work efficiently.  CLEAR LIQUIDS INCLUDE: Water Jello Ice Popsicles Tea (sugar ok, no milk/cream) Powdered fruit flavored drinks Coffee (sugar ok, no milk/cream) Gatorade Juice: apple, white grape, white cranberry  Lemonade Clear bullion, consomm, broth Carbonated beverages (any kind) Strained chicken noodle soup Hard Candy                             4.  In the morning, mix first dose of MoviPrep solution:    Empty 1 Pouch A and 1 Pouch B into the disposable container    Add lukewarm drinking water to the top line of the container. Mix to dissolve    Refrigerate (mixed solution should be used within 24 hrs)  5.  Begin drinking the prep at 5:00 p.m. The MoviPrep container is divided by 4 marks.   Every 15 minutes drink the solution down to the next mark (approximately 8 oz) until the full liter is complete.   6.  Follow completed prep with 16 oz of clear liquid of your choice  (Nothing red or purple).  Continue to drink clear liquids until bedtime.  7.  Before going to bed, mix second dose of MoviPrep solution:    Empty 1 Pouch A and 1 Pouch B into the disposable container    Add lukewarm drinking water to the top line of the container. Mix to dissolve    Refrigerate  THE DAY OF YOUR PROCEDURE      DATE: 12/13/10 DAY: Wednesday  Beginning at 8:30 a.m. (5 hours before procedure):         1. Every 15 minutes, drink the solution down to the next mark (approx 8 oz) until the full liter is complete.  2. Follow completed prep with 16 oz. of clear liquid of your choice.    3. You may drink clear liquids until 11:30am (2 HOURS BEFORE PROCEDURE).   MEDICATION INSTRUCTIONS  Unless otherwise instructed, you should take regular prescription medications with a small sip of water   as early as possible the morning of your  procedure.         OTHER INSTRUCTIONS  You will need a responsible adult at least 76 years of age to accompany you and drive you home.   This person must remain in the waiting room during your procedure.  Wear loose fitting clothing that is easily removed.  Leave jewelry and other valuables at home.  However, you may wish to bring a book to read or  an iPod/MP3 player to listen to music as you wait for your procedure to start.  Remove all body piercing jewelry and leave at home.  Total time from sign-in until discharge is approximately 2-3 hours.  You should go home directly after your procedure and rest.  You can resume normal activities the  day after your procedure.  The day of your procedure you should not:   Drive   Make legal decisions   Operate machinery   Drink alcohol   Return to work  You will receive specific instructions about eating, activities and medications before you leave.    The above instructions have been reviewed and explained to me by   Devin Hogan.     I fully understand and can verbalize these  instructions _____________________________ Date _________

## 2010-12-07 NOTE — Assessment & Plan Note (Signed)
Summary: discuss colon--ch.   History of Present Illness Visit Type: Initial Visit Primary GI MD: Joylene Igo MD [facility] Primary Provider: Cathlean Cower, MD Chief Complaint: Patient due for repeat colonoscopy due to tubular adenomas polyps. He denies any GI complaints.  History of Present Illness:   This is an 77 year old white male followed by Dr. Jenny Reichmann, who has a prior history of adenomatous colon polyps and is due for surveillance colonoscopy. He has no colorectal complaints. He has stable hypertension, BPH, and hyperlipidemia.   GI Review of Systems      Denies abdominal pain, acid reflux, belching, bloating, chest pain, dysphagia with liquids, dysphagia with solids, heartburn, loss of appetite, nausea, vomiting, vomiting blood, weight loss, and  weight gain.        Denies anal fissure, black tarry stools, change in bowel habit, constipation, diarrhea, diverticulosis, fecal incontinence, heme positive stool, hemorrhoids, irritable bowel syndrome, jaundice, light color stool, liver problems, rectal bleeding, and  rectal pain. Preventive Screening-Counseling & Management      Drug Use:  no.     Current Medications (verified): 1)  Aspirin 81 Mg  Tbec (Aspirin) .[name].. 1 By Mouth Once Daily 2)  Lisinopril 40 Mg  Tabs (Lisinopril) .[name].. 1 By Mouth Once Daily 3)  Dilt-Xr 240 Mg  Cp24 (Diltiazem Hcl) .[name].. 1 By Mouth Once Daily 4)  Mevacor 20 Mg  Tabs (Lovastatin) .[name].. 1 By Mouth Once Daily 5)  Hydrochlorothiazide 12.5 Mg  Tabs (Hydrochlorothiazide) .[name].. 1 By Mouth Once Daily  Allergies (verified): No Known Drug Allergies  Past History:  Past Medical History: Reviewed history from 07/07/2010 and no changes required. Hypertension Benign prostatic hypertrophy Erectile Dysfunction Hyperlipidemia Allergic rhinitis Tubular adenomas polyps 04/2000 Internal hemorrhoids RBBB Renal insufficiency  MD roster:  urology - cant remember  Past Surgical History: Reviewed history from  11/19/2007 and no changes required. Denies surgical history  Family History: brother died with pneumonia, stroke most family dies of "old age" No FH of Colon Cancer:  Social History: Former Smoker Alcohol use-yes "couple of drinks a day" retired Clinical cytogeneticist Married with two sons and two daughters 5 children Drug use-no Illicit Drug Use - no  Review of Systems  The patient denies allergy/sinus, anemia, anxiety-new, arthritis/joint pain, back pain, blood in urine, breast changes/lumps, change in vision, confusion, cough, coughing up blood, depression-new, fainting, fatigue, fever, headaches-new, hearing problems, heart murmur, heart rhythm changes, itching, muscle pains/cramps, night sweats, nosebleeds, shortness of breath, skin rash, sleeping problems, sore throat, swelling of feet/legs, swollen lymph glands, thirst - excessive, urination - excessive, urination changes/pain, urine leakage, vision changes, and voice change.    Vital Signs:  Patient profile:   77 year old male Height:      69 inches Weight:      181.2 pounds BMI:     26.86 Pulse rate:   74 / minute Pulse rhythm:   regular BP sitting:   118 / 60  (left arm) Cuff size:   regular  Vitals Entered By: Bernita Buffy CMA Deborra Medina) (November 22, 2010 10:59 AM)  Physical Exam  General:  Well developed, well nourished, no acute distress. Head:  Normocephalic and atraumatic. Eyes:  PERRLA, no icterus. Ears:  Normal auditory acuity. Mouth:  No deformity or lesions, dentition normal. Neck:  Supple; no masses or thyromegaly. Lungs:  Clear throughout to auscultation. Heart:  Regular rate and rhythm; no murmurs, rubs,  or bruits. Abdomen:  Soft, nontender and nondistended. No masses, hepatosplenomegaly or hernias noted. Normal bowel  sounds. Rectal:  deferred until time of colonoscopy.   Msk:  Symmetrical with no gross deformities. Normal posture. Pulses:  Normal pulses noted. Extremities:  No clubbing, cyanosis, edema or  deformities noted. Neurologic:  Alert and  oriented x4;  grossly normal neurologically. Cervical Nodes:  No significant cervical adenopathy. Inguinal Nodes:  No significant inguinal adenopathy. Psych:  Alert and cooperative. Normal mood and affect.  Impression & Recommendations:  Problem # 1:  COLONIC POLYPS, HX OF (ICD-V12.72) Personal history of adenomatous colon polyps. His health status is good and his medical problems are stable and he would like to proceed with colonoscopy. The risks, benefits and alternatives to colonoscopy with possible biopsy and possible polypectomy were discussed with the patient and they consent to proceed. The procedure will be scheduled electively. Orders: Colonoscopy (Colon)  Patient Instructions: 1)  Pick up your prep from your pharmacy.  2)  Colonoscopy brochure given.  3)  Copy sent to : Cathlean Cower, MD 4)  The medication list was reviewed and reconciled.  All changed / newly prescribed medications were explained.  A complete medication list was provided to the patient / caregiver.  Prescriptions: MOVIPREP 100 GM  SOLR (PEG-KCL-NACL-NASULF-NA ASC-C) As per prep instructions.  #1 x 0   Entered by:   Marlon Pel CMA ([place])   Authorized by:   Ladene Artist MD [facility]   Signed by:   Marlon Pel CMA ([place]) on 11/22/2010   Method used:   Electronically to        [facility] [phone]* (retail)       [place], [place]  [id number]       Ph: [phone] or [phone]       Fax: [phone]   RxID:   [id number]

## 2010-12-07 NOTE — Assessment & Plan Note (Signed)
Summary: 6 MTH FU/JSS   Vital Signs:  Patient profile:   77 year old male Height:      69 inches Weight:      178.38 pounds BMI:     26.44 O2 Sat:      97 % on Room air Temp:     97.8 degrees F oral Pulse rate:   50 / minute BP sitting:   108 / 62  (left arm) Cuff size:   regular  Vitals Entered By: Shirlean Mylar Ewing CMA (AAMA) (November 16, 2010 8:11 AM)  O2 Flow:  Room air CC: 6 month ROV/RE   CC:  6 month ROV/RE.  History of Present Illness: here for f/u; overall doing ok, except does have skin lesion near lateral corner of the left eye which has increased in size since last seen;  Pt denies CP, worsening sob, doe, wheezing, orthopnea, pnd, worsening LE edema, palps, dizziness or syncope  Pt denies new neuro symptoms such as headache, facial or extremity weakness  Pt denies polydipsia, polyuria  Overall good compliance with meds, trying to follow low chol diet, wt stable, little excercise however .  No fever, wt loss, night sweats, loss of appetite or other constitutional symptoms  Overall good compliance with meds, and good tolerability.  Denies worsening depressive symptoms, suicidal ideation, or panic.   Does not have worsening prostate or urinary retentive symtpoms or slowing stream, dysuria, freq, urgency.  Does have mild nasal allergy symptoms with clearish drainage, but no pain, fever, colored d/c or blood.    Problems Prior to Update: 1)  Skin Lesion  (ICD-709.9) 2)  Renal Insufficiency  (ICD-588.9) 3)  Preventive Health Care  (ICD-V70.0) 4)  Preventive Health Care  (ICD-V70.0) 5)  Colonic Polyps, Hx of  (ICD-V12.72) 6)  Allergic Rhinitis  (ICD-477.9) 7)  Hyperlipidemia  (ICD-272.4) 8)  Erectile Dysfunction  (ICD-302.72) 9)  Benign Prostatic Hypertrophy  (ICD-600.00) 10)  Hypertension  (ICD-401.9)  Medications Prior to Update: 1)  Aspirin 81 Mg  Tbec (Aspirin) .[name].. 1 By Mouth Once Daily 2)  Lisinopril 40 Mg  Tabs (Lisinopril) .[name].. 1 By Mouth Once Daily 3)  Dilt-Xr 240 Mg   Cp24 (Diltiazem Hcl) .[name].. 1 By Mouth Once Daily 4)  Mevacor 20 Mg  Tabs (Lovastatin) .[name].. 1 By Mouth Once Daily 5)  Hydrochlorothiazide 12.5 Mg  Tabs (Hydrochlorothiazide) .[name].. 1 By Mouth Once Daily  Current Medications (verified): 1)  Aspirin 81 Mg  Tbec (Aspirin) .[name].. 1 By Mouth Once Daily 2)  Lisinopril 40 Mg  Tabs (Lisinopril) .[name].. 1 By Mouth Once Daily 3)  Dilt-Xr 240 Mg  Cp24 (Diltiazem Hcl) .[name].. 1 By Mouth Once Daily 4)  Mevacor 20 Mg  Tabs (Lovastatin) .[name].. 1 By Mouth Once Daily 5)  Hydrochlorothiazide 12.5 Mg  Tabs (Hydrochlorothiazide) .[name].. 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   Head:  normocephalic and atraumatic.   Eyes:  vision grossly intact, pupils equal, and pupils round.   Ears:  R ear normal and L ear normal.   Nose:  no external deformity and no nasal discharge.   Mouth:  no gingival abnormalities and pharynx pink and moist.   Neck:  supple and no masses.   Lungs:  normal respiratory effort and normal breath sounds.   Heart:  normal rate and regular rhythm.   Abdomen:  soft, non-tender, and normal bowel sounds.   Rectal:  deferred per pt Msk:  no acute joint tenderness and no joint swelling.   Extremities:  no edema, no erythema  Neurologic:  strength normal in all extremities and  gait normal.   Skin:  approx .5 cm left facial skin lesion pedunculated, nontender Psych:  not anxious appearing and not depressed appearing.     Impression & Recommendations:  Problem # 1:  SKIN LESION (ICD-709.9) changing in size - for derm referral, likely needs removed, but i suspect is benign Orders: Dermatology Referral (Derma)  Problem # 2:  HYPERTENSION (ICD-401.9)  His updated medication list for this problem includes:    Lisinopril 40 Mg Tabs (Lisinopril) .[name]... 1 by mouth once daily    Dilt-xr 240 Mg Cp24 (Diltiazem hcl) .[name]... 1 by mouth once daily    Hydrochlorothiazide 12.5 Mg Tabs (Hydrochlorothiazide) .[name]... 1 by mouth once daily  BP today: 108/62 Prior BP: 120/72 (05/18/2010)  Labs Reviewed: K+: 4.8 (05/12/2010) Creat: : 1.5 (05/12/2010)   Chol: 133 (05/12/2010)   HDL: 53.50 (05/12/2010)   LDL: 69 (05/12/2010)   TG: 52.0 (05/12/2010) stable overall by hx and exam, ok to continue meds/tx as is   Problem # 3:  BENIGN PROSTATIC HYPERTROPHY (ICD-600.00) denies worsneing urinary retention type symptoms - no need for further meds at this time  Problem # 4:  ALLERGIC RHINITIS (ICD-477.9) ok for OTC claritin as needed   Complete Medication List: 1)  Aspirin 81 Mg Tbec (Aspirin) .[name].. 1 by mouth once daily 2)  Lisinopril 40 Mg Tabs (Lisinopril) .[name].. 1 by mouth once daily 3)  Dilt-xr 240 Mg Cp24 (Diltiazem hcl) .[name].. 1 by mouth once daily 4)  Mevacor 20 Mg Tabs (Lovastatin) .[name].. 1 by mouth once daily 5)  Hydrochlorothiazide 12.5 Mg Tabs (Hydrochlorothiazide) .[name].. 1 by mouth once daily  Patient Instructions: 1)  Continue all previous medications as before this visit 2)  You will be contacted about the referral(s) to: dermatology 3)  Please schedule a follow-up appointment in 6 months for CPX with LABs   Orders Added: 1)  Dermatology Referral [Derma] 2)  Est. Patient Level IV [id number]

## 2010-12-13 ENCOUNTER — Other Ambulatory Visit: Payer: Self-pay | Admitting: Gastroenterology

## 2010-12-13 ENCOUNTER — Ambulatory Visit (INDEPENDENT_AMBULATORY_CARE_PROVIDER_SITE_OTHER): Payer: Medicare Other | Admitting: Internal Medicine

## 2010-12-13 ENCOUNTER — Encounter: Payer: Self-pay | Admitting: Internal Medicine

## 2010-12-13 DIAGNOSIS — I10 Essential (primary) hypertension: Secondary | ICD-10-CM

## 2010-12-13 DIAGNOSIS — M109 Gout, unspecified: Secondary | ICD-10-CM

## 2010-12-13 HISTORY — DX: Gout, unspecified: M10.9

## 2010-12-21 NOTE — Assessment & Plan Note (Signed)
Summary: ?gout pain/feet   Vital Signs:  Patient profile:   77 year old male Height:      69 inches Weight:      182.50 pounds BMI:     27.05 O2 Sat:      96 % on Room air Temp:     97.3 degrees F oral Pulse rate:   82 / minute BP sitting:   120 / 60  (left arm) Cuff size:   regular  Vitals Entered By: Shirlean Mylar Ewing CMA Deborra Medina) (December 13, 2010 2:39 PM)  O2 Flow:  Room air CC: Left foot swollen/RE   Primary Care Provider:  Cathlean Cower, MD  CC:  Left foot swollen/RE.  History of Present Illness: here with acute onset pain to the left first MTP x 2 days, awoke with the pain , swelling and erythema, no fever , trauma or prior hx of gout;  no recent increased ETOH,  does take chronic diuretic, no recent decreased by mouth intake, n/v/d, bowel change, blood.  Pt denies CP, worsening sob, doe, wheezing, orthopnea, pnd, worsening LE edema, palps, dizziness or syncope  Pt denies new neuro symptoms such as headache, facial or extremity weakness  Pt denies polydipsia, polyuria   Overall good compliance with meds, trying to follow low chol diet, wt stable, little excercise however,  No other complaints  No fever, wt loss, night sweats, loss of appetite or other constitutional symptoms  Denies worsening depressive symptoms, suicidal ideation, or panic.    Problems Prior to Update: 1)  Acute Gouty Arthropathy  (ICD-274.01) 2)  Pre-operative Cardiovascular Examination  (ICD-V72.81) 3)  Skin Lesion  (ICD-709.9) 4)  Renal Insufficiency  (ICD-588.9) 5)  Preventive Health Care  (ICD-V70.0) 6)  Preventive Health Care  (ICD-V70.0) 7)  Colonic Polyps, Hx of  (ICD-V12.72) 8)  Allergic Rhinitis  (ICD-477.9) 9)  Hyperlipidemia  (ICD-272.4) 10)  Erectile Dysfunction  (ICD-302.72) 11)  Benign Prostatic Hypertrophy  (ICD-600.00) 12)  Hypertension  (ICD-401.9)  Medications Prior to Update: 1)  Aspirin 81 Mg  Tbec (Aspirin) .[name].. 1 By Mouth Once Daily 2)  Lisinopril 40 Mg  Tabs (Lisinopril) .[name].. 1 By  Mouth Once Daily 3)  Dilt-Xr 240 Mg  Cp24 (Diltiazem Hcl) .[name].. 1 By Mouth Once Daily 4)  Mevacor 20 Mg  Tabs (Lovastatin) .[name].. 1 By Mouth Once Daily 5)  Hydrochlorothiazide 12.5 Mg  Tabs (Hydrochlorothiazide) .[name].. 1 By Mouth Once Daily 6)  Moviprep 100 Gm  Solr (Peg-Kcl-Nacl-Nasulf-Na Asc-C) .... As Per Prep Instructions.  Current Medications (verified): 1)  Aspirin 81 Mg  Tbec (Aspirin) .[name].. 1 By Mouth Once Daily 2)  Lisinopril 40 Mg  Tabs (Lisinopril) .[name].. 1 By Mouth Once Daily 3)  Dilt-Xr 240 Mg  Cp24 (Diltiazem Hcl) .[name].. 1 By Mouth Once Daily 4)  Mevacor 20 Mg  Tabs (Lovastatin) .[name].. 1 By Mouth Once Daily 5)  Hydrochlorothiazide 12.5 Mg  Tabs (Hydrochlorothiazide) .[name].. 1 By Mouth Once Daily 6)  Moviprep 100 Gm  Solr (Peg-Kcl-Nacl-Nasulf-Na Asc-C) .... As Per Prep Instructions. 7)  Tramadol Hcl 100 Mg Xr24h-Tab (Tramadol Hcl) .[name].. 1po Once Daily As Needed Pain 8)  Prednisone 10 Mg Tabs (Prednisone) .... 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   Head:  normocephalic and atraumatic.   Eyes:  vision grossly intact, pupils equal, and pupils round.   Ears:  R ear normal and L ear  normal.   Nose:  no external deformity and no nasal discharge.   Mouth:  no gingival abnormalities and pharynx pink and moist.   Neck:  supple and no masses.   Lungs:  normal respiratory effort and normal breath sounds.   Heart:  normal rate and regular rhythm.   Msk:  left first MTP wtih 2-3+ red, tender , swelling - left foot o/w neurovasc intact Extremities:  no edema, no erythema except for above    Impression & Recommendations:  Problem # 1:  ACUTE GOUTY ARTHROPATHY (ICD-274.01)  left first MTP - clinical diagnosis, first epidose;  declines uric acid today, but will tx with depomedrol , and predpack for home, as needed pain med as well , pt asked to watch for onset polys to indicate elevated sugar on tx; consider allopurinol for any recurrence; to avoid ETOH and dehydration if possible  Orders: Depo- Medrol 40mg  (J1030) Depo- Medrol 80mg  (J1040) Admin of Therapeutic Inj  intramuscular or subcutaneous [id number])  Problem # 2:  HYPERTENSION (ICD-401.9)  His updated medication list for this problem includes:    Lisinopril 40 Mg Tabs (Lisinopril) .[name]... 1 by mouth once daily    Dilt-xr 240 Mg Cp24 (Diltiazem hcl) .[name]... 1 by mouth once daily    Hydrochlorothiazide 12.5 Mg Tabs (Hydrochlorothiazide) .[name]... 1 by mouth once daily Continue all previous medications as before this visit , including the diuretic for now, consider change if has recurrence  BP today: 120/60 Prior BP: 118/60 (11/22/2010)  Labs Reviewed: K+: 4.8 (05/12/2010) Creat: : 1.5 (05/12/2010)   Chol: 133 (05/12/2010)   HDL: 53.50 (05/12/2010)   LDL: 69 (05/12/2010)   TG: 52.0 (05/12/2010)  Complete Medication List: 1)  Aspirin 81 Mg Tbec (Aspirin) .[name].. 1 by mouth once daily 2)  Lisinopril 40 Mg Tabs (Lisinopril) .[name].. 1 by mouth once daily 3)  Dilt-xr 240 Mg Cp24 (Diltiazem hcl) .[name].. 1 by mouth once daily 4)  Mevacor 20 Mg Tabs (Lovastatin) .[name].. 1 by mouth once daily 5)  Hydrochlorothiazide 12.5 Mg Tabs  (Hydrochlorothiazide) .[name].. 1 by mouth once daily 6)  Moviprep 100 Gm Solr (Peg-kcl-nacl-nasulf-na asc-c) .... As per prep instructions. 7)  Tramadol Hcl 100 Mg Xr24h-tab (Tramadol hcl) .[name].. 1po once daily as needed pain 8)  Prednisone 10 Mg Tabs (Prednisone) .... 3po qd for 3days, then 2po qd for 3days, then 1po qd for 3days, then stop  Patient Instructions: 1)  You had the steroid shot today 2)  Please take all new medications as prescribed  - the pain medicine and the prednisone - (sent to your pharmacy) 3)  Continue all previous medications as before this visit  4)  Please schedule a follow-up appointment in 6 months for CPX with labs and: 5)  urice acid  274.01 Prescriptions: PREDNISONE 10 MG TABS (PREDNISONE) 3po qd for 3days, then 2po qd for 3days,  then 1po qd for 3days, then stop  #18 x 0   Entered and Authorized by:   Biagio Borg MD   Signed by:   Biagio Borg MD on 12/13/2010   Method used:   Electronically to        [facility] [phone]* (retail)       [place], [place]  [id number]       Ph: [phone] or [phone]       Fax: [phone]   RxID:   [id number] TRAMADOL HCL 100 MG XR24H-TAB (TRAMADOL HCL) 1po once daily as needed pain  #30 x 0   Entered and Authorized by:   Biagio Borg MD   Signed by:   Biagio Borg MD on 12/13/2010   Method used:   Electronically to        [facility] [phone]* (retail)       [address]       [place], [place]  [id number]       Ph: [phone] or [phone]       Fax: [phone]   RxID:   [id number]    Medication Administration  Injection # 1:    Medication: Depo- Medrol 40mg     Diagnosis: ACUTE GOUTY ARTHROPATHY (ICD-274.01)    Route: IM    Site: LUOQ gluteus    Exp Date: 05/2013    Lot #: [id number]    Mfr: [employer]    Comments: Patient received 120mg  Depo-Medrol    Patient tolerated injection without complications     Given by: Shirlean Mylar Ewing CMA ([place]) (December 13, 2010 3:21 PM)  Injection # 2:    Medication: Depo- Medrol 80mg     Diagnosis: ACUTE GOUTY ARTHROPATHY (ICD-274.01)    Route: IM    Site: LUOQ gluteus    Exp Date: 05/2013    Lot #: [id number]    Mfr: [employer]    Given by: Shirlean Mylar Ewing CMA ([place]) (December 13, 2010 3:21 PM)  Orders Added: 1)  Depo- Medrol 40mg  [J1030] 2)  Depo- Medrol 80mg  [J1040] 3)  Admin of Therapeutic Inj  intramuscular or subcutaneous [[id number]] 4)  Est. Patient Level III [id number]

## 2011-01-09 ENCOUNTER — Other Ambulatory Visit: Payer: Self-pay | Admitting: Dermatology

## 2011-01-31 ENCOUNTER — Ambulatory Visit (INDEPENDENT_AMBULATORY_CARE_PROVIDER_SITE_OTHER)
Admission: RE | Admit: 2011-01-31 | Discharge: 2011-01-31 | Disposition: A | Discharge status: Home / Self Care | Payer: Medicare Other | Source: Ambulatory Visit | Attending: Internal Medicine | Admitting: Internal Medicine

## 2011-01-31 ENCOUNTER — Other Ambulatory Visit (INDEPENDENT_AMBULATORY_CARE_PROVIDER_SITE_OTHER): Payer: Medicare Other

## 2011-01-31 ENCOUNTER — Ambulatory Visit (INDEPENDENT_AMBULATORY_CARE_PROVIDER_SITE_OTHER): Payer: Medicare Other | Admitting: Internal Medicine

## 2011-01-31 ENCOUNTER — Encounter: Payer: Self-pay | Admitting: Internal Medicine

## 2011-01-31 VITALS — BP 98/60 | HR 54 | Temp 97.8°F | Ht 69.0 in | Wt 170.4 lb

## 2011-01-31 DIAGNOSIS — R0609 Other forms of dyspnea: Secondary | ICD-10-CM

## 2011-01-31 DIAGNOSIS — R0989 Other specified symptoms and signs involving the circulatory and respiratory systems: Secondary | ICD-10-CM

## 2011-01-31 DIAGNOSIS — I10 Essential (primary) hypertension: Secondary | ICD-10-CM

## 2011-01-31 DIAGNOSIS — M109 Gout, unspecified: Secondary | ICD-10-CM

## 2011-01-31 DIAGNOSIS — N259 Disorder resulting from impaired renal tubular function, unspecified: Secondary | ICD-10-CM

## 2011-01-31 DIAGNOSIS — R0600 Dyspnea, unspecified: Secondary | ICD-10-CM

## 2011-01-31 DIAGNOSIS — Z79899 Other long term (current) drug therapy: Secondary | ICD-10-CM

## 2011-01-31 LAB — CBC WITH DIFFERENTIAL/PLATELET
Basophils Absolute: 0 10*3/uL (ref 0.0–0.1)
Basophils Relative: 0.4 % (ref 0.0–3.0)
Eosinophils Relative: 2.7 % (ref 0.0–5.0)
Hemoglobin: 11.5 g/dL — ABNORMAL LOW (ref 13.0–17.0)
Lymphocytes Relative: 14.5 % (ref 12.0–46.0)
Monocytes Relative: 13.2 % — ABNORMAL HIGH (ref 3.0–12.0)
Neutro Abs: 3.2 10*3/uL (ref 1.4–7.7)
RBC: 3.71 Mil/uL — ABNORMAL LOW (ref 4.22–5.81)
RDW: 15.7 % — ABNORMAL HIGH (ref 11.5–14.6)
WBC: 4.6 10*3/uL (ref 4.5–10.5)

## 2011-01-31 LAB — HEPATIC FUNCTION PANEL
Albumin: 3.2 g/dL — ABNORMAL LOW (ref 3.5–5.2)
Alkaline Phosphatase: 45 U/L (ref 39–117)
Total Bilirubin: 0.3 mg/dL (ref 0.3–1.2)

## 2011-01-31 LAB — BASIC METABOLIC PANEL
CO2: 22 mEq/L (ref 19–32)
Chloride: 111 mEq/L (ref 96–112)
Creatinine, Ser: 2.3 mg/dL — ABNORMAL HIGH (ref 0.4–1.5)
Glucose, Bld: 83 mg/dL (ref 70–99)

## 2011-01-31 MED ORDER — LISINOPRIL 40 MG PO TABS: ORAL_TABLET | ORAL | Status: DC

## 2011-01-31 NOTE — Assessment & Plan Note (Signed)
Exam benign, etiology unclear;  Will check orthostatics today, as well as decrease the ACE inhibitor by half;  O/w Continue all other medications as before ;  Check cxr, routine labs (dont think he needs d-dimer) and echo to further assess;  F/u 2 wks or sooner for worsening symptoms

## 2011-01-31 NOTE — Assessment & Plan Note (Addendum)
?   Overcontrolled;  To decrease the lisinopril to 20 mg, check labs as above, f/u BP at home and next visit

## 2011-01-31 NOTE — Assessment & Plan Note (Signed)
Currently .stable overall by hx and exam, ok to continue meds/tx as is

## 2011-01-31 NOTE — Patient Instructions (Signed)
Please take only HALF of the lisinopril 40 mg  -   To only take 20 mg Continue all other medications as before Your EKG was ok today Please go to Xray in the Basement for the Xray today Please go to LAB in the Basement for the blood and/or urine tests to be done today You will be contacted regarding the referral for: echocardiogram (painless test to take picture of the heart) Please call the number on the [facility] for results in 2-3 days Please followup in 3 wks

## 2011-01-31 NOTE — Progress Notes (Signed)
Subjective:    Patient ID: Devin Hogan, male    DOB: 1928-02-15, 77 y.o.   MRN: [id number]  HPI  Here with c/o increased dyspnea and weakness, usually with working in the yard lately approx 2 wks.  Pt denies chest pain,  wheezing, orthopnea, PND, increased LE swelling, palpitations, dizziness or syncope, but has had some nonprod cough occasionally, but no fever, ST, HA , chills.  Pt denies new neurological symptoms such as new headache, or facial or extremity weakness or numbness.  . Pt denies polydipsia, polyuria, though does try to drink plenty of fluids overall.  Pt states overall good compliance with meds, trying to follow lower cholesterol diet, wt overall stable but little exercise however except for the above.  Overall good compliance with treatment, and good medicine tolerability.  Denies worsening depressive symptoms, suicidal ideation, or panic.   Pt denies fever, but has had 5 lbs wt loss per pt in the past 2 wks, and mild loss of appetite, but no night sweats or other constitutional symptoms.  No overt bleeding or bruising. Does not take anticoags. No recent gout symtpoms or pain.  Past Medical History  Diagnosis Date  . HYPERLIPIDEMIA 05/22/2007  . ERECTILE DYSFUNCTION 05/22/2007  . HYPERTENSION 05/22/2007  . ALLERGIC RHINITIS 05/22/2007  . RENAL INSUFFICIENCY 11/18/2009  . BENIGN PROSTATIC HYPERTROPHY 05/22/2007  . COLONIC POLYPS, HX OF 11/19/2007  . SKIN LESION 11/16/2010  . Acute gouty arthropathy 12/13/2010   No past surgical history on file.  reports that he has quit smoking. He does not have any smokeless tobacco history on file. He reports that he drinks alcohol. He reports that he does not use illicit drugs. family history is not on file. Not on File   Current Outpatient Prescriptions on File Prior to Visit  Medication Sig Dispense Refill  . aspirin 81 MG EC tablet Take 81 mg by mouth daily.        [name] diltiazem (DILACOR XR) 240 MG 24 hr capsule Take 240 mg by mouth daily.          . hydrochlorothiazide (,MICROZIDE/HYDRODIURIL,) 12.5 MG capsule Take 12.5 mg by mouth daily.        [name] lisinopril (PRINIVIL,ZESTRIL) 40 MG tablet Take 40 mg by mouth daily.        [name] lovastatin (MEVACOR) 20 MG tablet Take 20 mg by mouth daily.        . traMADol (ULTRAM-ER) 100 MG 24 hr tablet Take 100 mg by mouth daily. For pain       . predniSONE (DELTASONE) 10 MG tablet Take 10 mg by mouth. 3 by mouth every day for 3 days, then 2 by mouth every day for 3 days, then 1 by mouth every day for 3 days, then stop.        Review of Systems Review of Systems  Constitutional: Negative for diaphoresis and unexpected weight change.  HENT: Negative for drooling and tinnitus.   Eyes: Negative for photophobia and visual disturbance.  Respiratory: Negative for choking and stridor.   Gastrointestinal: Negative for vomiting and blood in stool.  Genitourinary: Negative for hematuria and decreased urine volume.  Musculoskeletal: Negative for gait problem.  Skin: Negative for color change and wound.  Neurological: Negative for tremors and numbness.  Psychiatric/Behavioral: Negative for decreased concentration. The patient is not hyperactive.       Objective:   Physical Exam  BP 96/60  Pulse 54  Temp(Src) 97.8 F (36.6 C) (Oral)  Ht 5\' 9"  (1.753  m)  Wt 170 lb 6 oz (77.282 kg)  BMI 25.16 kg/m2  SpO2 94% Physical Exam  VS noted Constitutional: Pt appears well-developed and well-nourished.  HENT: Head: Normocephalic.  Right Ear: External ear normal.  Left Ear: External ear normal.  Eyes: Conjunctivae and EOM are normal. Pupils are equal, round, and reactive to light.  Neck: Normal range of motion. Neck supple.  Cardiovascular: Normal rate and regular rhythm.   Pulmonary/Chest: Effort normal and breath sounds normal.  Abd:  Soft, NT, non-distended, + BS Neurological: Pt is alert. No cranial nerve deficit.  Skin: Skin is warm. No erythema.  Psychiatric: Pt behavior is normal. Thought content normal.          Assessment & Plan:

## 2011-01-31 NOTE — Assessment & Plan Note (Signed)
I suspect overall stable, as volume seems stable, but will check bun/cr and lytes with labs above

## 2011-02-01 ENCOUNTER — Other Ambulatory Visit: Payer: Self-pay

## 2011-02-01 ENCOUNTER — Encounter: Payer: Self-pay | Admitting: Internal Medicine

## 2011-02-01 LAB — IBC PANEL
Saturation Ratios: 11.9 % — ABNORMAL LOW (ref 20.0–50.0)
Transferrin: 186.8 mg/dL — ABNORMAL LOW (ref 212.0–360.0)

## 2011-02-01 LAB — B12 AND FOLATE PANEL
Folate: 7.9 ng/mL (ref >5.9)
Vitamin B-12: 289 pg/mL (ref 211–911)

## 2011-02-18 ENCOUNTER — Encounter: Payer: Self-pay | Admitting: Internal Medicine

## 2011-02-18 DIAGNOSIS — Z0000 Encounter for general adult medical examination without abnormal findings: Secondary | ICD-10-CM | POA: Insufficient documentation

## 2011-02-21 ENCOUNTER — Encounter: Payer: Self-pay | Admitting: Internal Medicine

## 2011-02-21 ENCOUNTER — Ambulatory Visit (INDEPENDENT_AMBULATORY_CARE_PROVIDER_SITE_OTHER): Payer: Medicare Other | Admitting: Internal Medicine

## 2011-02-21 VITALS — BP 112/68 | HR 64 | Temp 97.3°F | Ht 69.0 in | Wt 168.4 lb

## 2011-02-21 DIAGNOSIS — E785 Hyperlipidemia, unspecified: Secondary | ICD-10-CM

## 2011-02-21 DIAGNOSIS — R0989 Other specified symptoms and signs involving the circulatory and respiratory systems: Secondary | ICD-10-CM

## 2011-02-21 DIAGNOSIS — R0600 Dyspnea, unspecified: Secondary | ICD-10-CM | POA: Insufficient documentation

## 2011-02-21 DIAGNOSIS — N259 Disorder resulting from impaired renal tubular function, unspecified: Secondary | ICD-10-CM

## 2011-02-21 DIAGNOSIS — Z0000 Encounter for general adult medical examination without abnormal findings: Secondary | ICD-10-CM

## 2011-02-21 DIAGNOSIS — I10 Essential (primary) hypertension: Secondary | ICD-10-CM

## 2011-02-21 NOTE — Assessment & Plan Note (Signed)
Improved and stable overall by hx and exam, most recent lab reviewed with pt, and pt to continue medical treatment as before

## 2011-02-21 NOTE — Patient Instructions (Signed)
The current medical regimen is effective;  continue present plan and medications. Please continue the plan to have your Echocardiogram done tomorrow Please return in 3 mo with Lab testing done 3-5 days before

## 2011-02-21 NOTE — Assessment & Plan Note (Signed)
A.sl d/w pt- stable overall by hx and exam, most recent lab reviewed with pt, and pt to continue medical treatment as before   Lab Results  Component Value Date   LDLCALC 69 05/12/2010

## 2011-02-21 NOTE — Progress Notes (Signed)
  Subjective:    Patient ID: Devin Hogan, male    DOB: [birth year], 77 y.o.   MRN: [id number]  HPI  Here to f/u last visit,  Did decrease the ACE as per last visit, with all symptoms resolved, now back to working in the yard at baseline,  Still has the echo planned for tomorrow;  No new complaints.  Pt denies chest pain, increased sob or doe, wheezing, orthopnea, PND, increased LE swelling, palpitations, dizziness or syncope. Pt denies new neurological symptoms such as new headache, or facial or extremity weakness or numbness  Pt denies polydipsia, polyuria  Pt states overall good compliance with meds, trying to follow lower cholesterol  diet, wt overall stable. No new complaints Past Medical History  Diagnosis Date  . HYPERLIPIDEMIA 05/22/2007  . ERECTILE DYSFUNCTION 05/22/2007  . HYPERTENSION 05/22/2007  . ALLERGIC RHINITIS 05/22/2007  . RENAL INSUFFICIENCY 11/18/2009  . BENIGN PROSTATIC HYPERTROPHY 05/22/2007  . COLONIC POLYPS, HX OF 11/19/2007  . SKIN LESION 11/16/2010  . Acute gouty arthropathy 12/13/2010   No past surgical history on file.  reports that he has quit smoking. He does not have any smokeless tobacco history on file. He reports that he drinks alcohol. He reports that he does not use illicit drugs. family history is not on file. No Known Allergies Current Outpatient Prescriptions on File Prior to Visit  Medication Sig Dispense Refill  . aspirin 81 MG EC tablet Take 81 mg by mouth daily.        [name] diltiazem (DILACOR XR) 240 MG 24 hr capsule Take 240 mg by mouth daily.        . hydrochlorothiazide (,MICROZIDE/HYDRODIURIL,) 12.5 MG capsule Take 12.5 mg by mouth daily.        [name] lovastatin (MEVACOR) 20 MG tablet Take 20 mg by mouth daily.        . traMADol (ULTRAM-ER) 100 MG 24 hr tablet Take 100 mg by mouth daily. For pain        Review of Systems All otherwise neg per pt    Objective:   Physical Exam BP 112/68  Pulse 64  Temp(Src) 97.3 F (36.3 C) (Oral)  Ht 5\' 9"  (1.753 m)   Wt 168 lb 6 oz (76.374 kg)  BMI 24.86 kg/m2  SpO2 97% Physical Exam  VS noted Constitutional: Pt appears well-developed and well-nourished.  HENT: Head: Normocephalic.  Right Ear: External ear normal.  Left Ear: External ear normal.  Eyes: Conjunctivae and EOM are normal. Pupils are equal, round, and reactive to light.  Neck: Normal range of motion. Neck supple.  Cardiovascular: Normal rate and regular rhythm.   Pulmonary/Chest: Effort normal and breath sounds normal.  Abd:  Soft, NT, non-distended, + BS Neurological: Pt is alert. No cranial nerve deficit.  Skin: Skin is warm. No erythema.  Psychiatric: Pt behavior is normal. Thought content normal.        Assessment & Plan:

## 2011-02-21 NOTE — Assessment & Plan Note (Signed)
Resolved, likely due to overcontrolled BP/med effect;  Pt wants to f/u with echo tomorrow and I stated this would be ok;  Continue all other medications as before

## 2011-02-21 NOTE — Assessment & Plan Note (Signed)
stable overall by hx and exam, most recent lab reviewed with pt, and pt to continue medical treatment as before  Lab Results  Component Value Date   WBC 4.6 01/31/2011   HGB 11.5* 01/31/2011   HCT 33.7* 01/31/2011   PLT 138.0* 01/31/2011   CHOL 133 05/12/2010   TRIG 52.0 05/12/2010   HDL 53.50 05/12/2010   ALT 10 01/31/2011   AST 13 01/31/2011   NA 140 01/31/2011   K 5.3* 01/31/2011   CL 111 01/31/2011   CREATININE 2.3* 01/31/2011   BUN 36* 01/31/2011   CO2 22 01/31/2011   TSH 1.93 01/31/2011   PSA 0.63 05/12/2010

## 2011-02-22 ENCOUNTER — Ambulatory Visit (HOSPITAL_COMMUNITY): Payer: Medicare Other | Attending: Internal Medicine | Admitting: Radiology

## 2011-02-22 DIAGNOSIS — I079 Rheumatic tricuspid valve disease, unspecified: Secondary | ICD-10-CM | POA: Insufficient documentation

## 2011-02-22 DIAGNOSIS — I080 Rheumatic disorders of both mitral and aortic valves: Secondary | ICD-10-CM | POA: Insufficient documentation

## 2011-02-22 DIAGNOSIS — R0989 Other specified symptoms and signs involving the circulatory and respiratory systems: Secondary | ICD-10-CM | POA: Insufficient documentation

## 2011-02-22 DIAGNOSIS — R0600 Dyspnea, unspecified: Secondary | ICD-10-CM

## 2011-02-22 DIAGNOSIS — R0609 Other forms of dyspnea: Secondary | ICD-10-CM | POA: Insufficient documentation

## 2011-02-22 DIAGNOSIS — I379 Nonrheumatic pulmonary valve disorder, unspecified: Secondary | ICD-10-CM | POA: Insufficient documentation

## 2011-02-22 DIAGNOSIS — I10 Essential (primary) hypertension: Secondary | ICD-10-CM | POA: Insufficient documentation

## 2011-05-11 ENCOUNTER — Other Ambulatory Visit (INDEPENDENT_AMBULATORY_CARE_PROVIDER_SITE_OTHER): Payer: Medicare Other

## 2011-05-11 DIAGNOSIS — E785 Hyperlipidemia, unspecified: Secondary | ICD-10-CM

## 2011-05-11 DIAGNOSIS — Z0000 Encounter for general adult medical examination without abnormal findings: Secondary | ICD-10-CM

## 2011-05-11 LAB — CBC WITH DIFFERENTIAL/PLATELET
Basophils Absolute: 0 10*3/uL (ref 0.0–0.1)
Eosinophils Absolute: 0.1 10*3/uL (ref 0.0–0.7)
MCHC: 33.1 g/dL (ref 30.0–36.0)
MCV: 93.4 fl (ref 78.0–100.0)
Monocytes Absolute: 0.6 10*3/uL (ref 0.1–1.0)
Neutrophils Relative %: 54.1 % (ref 43.0–77.0)
Platelets: 109 10*3/uL — ABNORMAL LOW (ref 150.0–400.0)
RDW: 14.3 % (ref 11.5–14.6)

## 2011-05-11 LAB — URINALYSIS, ROUTINE W REFLEX MICROSCOPIC
Nitrite: NEGATIVE
Specific Gravity, Urine: 1.025 (ref 1.000–1.030)
Urobilinogen, UA: 0.2 (ref 0.0–1.0)

## 2011-05-11 LAB — LIPID PANEL
Cholesterol: 159 mg/dL (ref 0–200)
LDL Cholesterol: 88 mg/dL (ref 0–99)
VLDL: 10.6 mg/dL (ref 0.0–40.0)

## 2011-05-11 LAB — HEPATIC FUNCTION PANEL
Bilirubin, Direct: 0.1 mg/dL (ref 0.0–0.3)
Total Bilirubin: 0.5 mg/dL (ref 0.3–1.2)
Total Protein: 7 g/dL (ref 6.0–8.3)

## 2011-05-11 LAB — BASIC METABOLIC PANEL
BUN: 24 mg/dL — ABNORMAL HIGH (ref 6–23)
CO2: 24 mEq/L (ref 19–32)
Chloride: 111 mEq/L (ref 96–112)
Glucose, Bld: 77 mg/dL (ref 70–99)
Potassium: 4.6 mEq/L (ref 3.5–5.1)

## 2011-05-17 ENCOUNTER — Ambulatory Visit (INDEPENDENT_AMBULATORY_CARE_PROVIDER_SITE_OTHER): Payer: Medicare Other | Admitting: Internal Medicine

## 2011-05-17 ENCOUNTER — Encounter: Payer: Self-pay | Admitting: Internal Medicine

## 2011-05-17 VITALS — BP 128/82 | HR 57 | Temp 97.8°F | Ht 69.0 in | Wt 177.2 lb

## 2011-05-17 DIAGNOSIS — M109 Gout, unspecified: Secondary | ICD-10-CM | POA: Insufficient documentation

## 2011-05-17 DIAGNOSIS — R0600 Dyspnea, unspecified: Secondary | ICD-10-CM

## 2011-05-17 DIAGNOSIS — N259 Disorder resulting from impaired renal tubular function, unspecified: Secondary | ICD-10-CM

## 2011-05-17 DIAGNOSIS — Z0000 Encounter for general adult medical examination without abnormal findings: Secondary | ICD-10-CM

## 2011-05-17 DIAGNOSIS — E785 Hyperlipidemia, unspecified: Secondary | ICD-10-CM

## 2011-05-17 DIAGNOSIS — R0989 Other specified symptoms and signs involving the circulatory and respiratory systems: Secondary | ICD-10-CM

## 2011-05-17 MED ORDER — PNEUMOCOCCAL VAC POLYVALENT 25 MCG/0.5ML IJ INJ: 0.5000 mL | INJECTION | Freq: Once | INTRAMUSCULAR | Status: DC

## 2011-05-17 NOTE — Assessment & Plan Note (Signed)
stable overall by hx and exam, most recent data reviewed with pt, and pt to continue medical treatment as before  For labs next visit as well

## 2011-05-17 NOTE — Assessment & Plan Note (Signed)
In retrospect may have been related to subclinical asthma, would consider proair hfa trial for symtpoms in the future

## 2011-05-17 NOTE — Progress Notes (Signed)
Subjective:    Patient ID: Devin Hogan, male    DOB: 05-26-1928, 77 y.o.   MRN: [id number]  HPI  Here for wellness and f/u;  Overall doing ok;  Pt denies CP, worsening SOB, DOE, wheezing, orthopnea, PND, worsening LE edema, palpitations, dizziness or syncope.  Pt denies neurological change such as new Headache, facial or extremity weakness.  Pt denies polydipsia, polyuria, or low sugar symptoms. Pt states overall good compliance with treatment and medications, good tolerability, and trying to follow lower cholesterol diet.  Pt denies worsening depressive symptoms, suicidal ideation or panic. No fever, wt loss, night sweats, loss of appetite, or other constitutional symptoms.  Pt states good ability with ADL's, low fall risk, home safety reviewed and adequate, no significant changes in hearing or vision, and occasionally active with exercise. DOE form mar 2012 resolved spontanesly per pt, echo with mild diast dysfxn only, and labs stalbe.  Now back to working in the yard without signficant sob/doe/excercise intolerance. Past Medical History  Diagnosis Date  . HYPERLIPIDEMIA 05/22/2007  . ERECTILE DYSFUNCTION 05/22/2007  . HYPERTENSION 05/22/2007  . ALLERGIC RHINITIS 05/22/2007  . RENAL INSUFFICIENCY 11/18/2009  . BENIGN PROSTATIC HYPERTROPHY 05/22/2007  . COLONIC POLYPS, HX OF 11/19/2007  . SKIN LESION 11/16/2010  . Acute gouty arthropathy 12/13/2010  . Gout 05/17/2011   No past surgical history on file.  reports that he has quit smoking. He does not have any smokeless tobacco history on file. He reports that he drinks alcohol. He reports that he does not use illicit drugs. family history is not on file. No Known Allergies Current Outpatient Prescriptions on File Prior to Visit  Medication Sig Dispense Refill  . aspirin 81 MG EC tablet Take 81 mg by mouth daily.        [name] diltiazem (DILACOR XR) 240 MG 24 hr capsule Take 240 mg by mouth daily.        . hydrochlorothiazide (,MICROZIDE/HYDRODIURIL,) 12.5  MG capsule Take 12.5 mg by mouth daily.        [name] lovastatin (MEVACOR) 20 MG tablet Take 20 mg by mouth daily.        . traMADol (ULTRAM-ER) 100 MG 24 hr tablet Take 100 mg by mouth daily. For pain        Review of Systems Review of Systems  Constitutional: Negative for diaphoresis, activity change, appetite change and unexpected weight change.  HENT: Negative for hearing loss, ear pain, facial swelling, mouth sores and neck stiffness.   Eyes: Negative for pain, redness and visual disturbance.  Respiratory: Negative for shortness of breath and wheezing.   Cardiovascular: Negative for chest pain and palpitations.  Gastrointestinal: Negative for diarrhea, blood in stool, abdominal distention and rectal pain.  Genitourinary: Negative for hematuria, flank pain and decreased urine volume.  Musculoskeletal: Negative for myalgias and joint swelling.  Skin: Negative for color change and wound.  Neurological: Negative for syncope and numbness.  Hematological: Negative for adenopathy.  Psychiatric/Behavioral: Negative for hallucinations, self-injury, decreased concentration and agitation.      Objective:   Physical Exam BP 128/82  Pulse 57  Temp(Src) 97.8 F (36.6 C) (Oral)  Ht 5\' 9"  (1.753 m)  Wt 177 lb 3.2 oz (80.377 kg)  BMI 26.17 kg/m2  SpO2 97% Physical Exam  VS noted Constitutional: Pt is oriented to person, place, and time. Appears well-developed and well-nourished.  HENT:  Head: Normocephalic and atraumatic.  Right Ear: External ear normal.  Left Ear: External ear normal.  Nose: Nose normal.  Mouth/Throat: Oropharynx is clear and moist.  Eyes: Conjunctivae and EOM are normal. Pupils are equal, round, and reactive to light.  Neck: Normal range of motion. Neck supple. No JVD present. No tracheal deviation present.  Cardiovascular: Normal rate, regular rhythm, normal heart sounds and intact distal pulses.   Pulmonary/Chest: Effort normal and breath sounds normal.  Abdominal: Soft.  Bowel sounds are normal. There is no tenderness.  Musculoskeletal: Normal range of motion. Exhibits no edema.  Lymphadenopathy:  Has no cervical adenopathy.  Neurological: Pt is alert and oriented to person, place, and time. Pt has normal reflexes. No cranial nerve deficit.  Skin: Skin is warm and dry. No rash noted.  Psychiatric:  Has  normal mood and affect. Behavior is normal. 1+nervous       Assessment & Plan:

## 2011-05-17 NOTE — Assessment & Plan Note (Signed)
Overall doing well, age appropriate education and counseling updated, referrals for preventative services and immunizations addressed, dietary and smoking counseling addressed, most recent labs and ECG reviewed.  I have personally reviewed and have noted: 1) the patient's medical and social history 2) The pt's use of alcohol, tobacco, and illicit drugs 3) The patient's current medications and supplements 4) Functional ability including ADL's, fall risk, home safety risk, hearing and visual impairment 5) Diet and physical activities 6) Evidence for depression or mood disorder 7) The patient's height, weight, and BMI have been recorded in the chart I have made referrals, and provided counseling and education based on review of the above For pneumovax as he is due, and routine labs

## 2011-05-17 NOTE — Patient Instructions (Addendum)
Continue all other medications as before You had the pneumonia shot today Please return in 6 mo with Lab testing done 3-5 days before

## 2011-06-27 ENCOUNTER — Other Ambulatory Visit: Payer: Self-pay

## 2011-06-27 MED ORDER — HYDROCHLOROTHIAZIDE 12.5 MG PO CAPS: 12.5000 mg | ORAL_CAPSULE | Freq: Every day | ORAL | Status: DC

## 2011-08-13 ENCOUNTER — Other Ambulatory Visit: Payer: Self-pay

## 2011-08-13 MED ORDER — DILTIAZEM HCL ER 240 MG PO CP24: 240.0000 mg | ORAL_CAPSULE | Freq: Every day | ORAL | Status: DC

## 2011-08-13 MED ORDER — LOVASTATIN 20 MG PO TABS: 20.0000 mg | ORAL_TABLET | Freq: Every day | ORAL | Status: DC

## 2011-12-05 ENCOUNTER — Other Ambulatory Visit (INDEPENDENT_AMBULATORY_CARE_PROVIDER_SITE_OTHER): Payer: Medicare Other

## 2011-12-05 ENCOUNTER — Encounter: Payer: Self-pay | Admitting: Internal Medicine

## 2011-12-05 ENCOUNTER — Ambulatory Visit (INDEPENDENT_AMBULATORY_CARE_PROVIDER_SITE_OTHER): Payer: Medicare Other | Admitting: Internal Medicine

## 2011-12-05 DIAGNOSIS — D696 Thrombocytopenia, unspecified: Secondary | ICD-10-CM

## 2011-12-05 DIAGNOSIS — Z0000 Encounter for general adult medical examination without abnormal findings: Secondary | ICD-10-CM

## 2011-12-05 DIAGNOSIS — E785 Hyperlipidemia, unspecified: Secondary | ICD-10-CM

## 2011-12-05 DIAGNOSIS — N259 Disorder resulting from impaired renal tubular function, unspecified: Secondary | ICD-10-CM

## 2011-12-05 DIAGNOSIS — I10 Essential (primary) hypertension: Secondary | ICD-10-CM

## 2011-12-05 LAB — CBC WITH DIFFERENTIAL/PLATELET
Basophils Relative: 1.1 % (ref 0.0–3.0)
Eosinophils Relative: 3.7 % (ref 0.0–5.0)
Lymphocytes Relative: 32.6 % (ref 12.0–46.0)
Monocytes Relative: 13.1 % — ABNORMAL HIGH (ref 3.0–12.0)
Neutrophils Relative %: 49.5 % (ref 43.0–77.0)
Platelets: 163 10*3/uL (ref 150.0–400.0)
RBC: 4.28 Mil/uL (ref 4.22–5.81)
WBC: 4 10*3/uL — ABNORMAL LOW (ref 4.5–10.5)

## 2011-12-05 LAB — BASIC METABOLIC PANEL
BUN: 28 mg/dL — ABNORMAL HIGH (ref 6–23)
Creatinine, Ser: 1.8 mg/dL — ABNORMAL HIGH (ref 0.4–1.5)
GFR: 46.13 mL/min — ABNORMAL LOW (ref >60.00)

## 2011-12-05 LAB — LIPID PANEL
Cholesterol: 151 mg/dL (ref 0–200)
LDL Cholesterol: 93 mg/dL (ref 0–99)
Total CHOL/HDL Ratio: 3
Triglycerides: 45 mg/dL (ref 0.0–149.0)
VLDL: 9 mg/dL (ref 0.0–40.0)

## 2011-12-05 NOTE — Assessment & Plan Note (Signed)
Mild chronic persistent, for f/u labs today, volume stable  Lab Results  Component Value Date   WBC 4.5 05/11/2011   HGB 12.5* 05/11/2011   HCT 37.8* 05/11/2011   PLT 109.0 Repeated and verified X2.* 05/11/2011   GLUCOSE 77 05/11/2011   CHOL 159 05/11/2011   TRIG 53.0 05/11/2011   HDL 60.80 05/11/2011   LDLCALC 88 05/11/2011   ALT 11 05/11/2011   AST 17 05/11/2011   NA 140 05/11/2011   K 4.6 05/11/2011   CL 111 05/11/2011   CREATININE 1.7* 05/11/2011   BUN 24* 05/11/2011   CO2 24 05/11/2011   TSH 3.39 05/11/2011   PSA 0.63 05/12/2010

## 2011-12-05 NOTE — Patient Instructions (Signed)
Continue all other medications as before Please go to LAB in the [facility] for the blood and/or urine tests to be done today Please call the phone number [phone] (the [facility]) for results of testing in 2-3 days;  When calling, simply dial the number, and when prompted enter the MRN number above (the Medical Record Number) and the # key, then the message should start. Please return in 6 mo with Lab testing done 3-5 days before

## 2011-12-05 NOTE — Assessment & Plan Note (Signed)
stable overall by hx and exam, most recent data reviewed with pt, and pt to continue medical treatment as before  Lab Results  Component Value Date   [place] 88 05/11/2011  for lipids today

## 2011-12-05 NOTE — Assessment & Plan Note (Signed)
Mild chronic persistent, asympt, for f/u today  Lab Results  Component Value Date   PLT 109.0 Repeated and verified X2.* 05/11/2011

## 2011-12-05 NOTE — Progress Notes (Signed)
Subjective:    Patient ID: Devin Hogan, male    DOB: 10-06-1928, 78 y.o.   MRN: [id number]  HPI  Here to f/u; overall doing ok,  Pt denies chest pain, increased sob or doe, wheezing, orthopnea, PND, increased LE swelling, palpitations, dizziness or syncope.  Pt denies new neurological symptoms such as new headache, or facial or extremity weakness or numbness   Pt denies polydipsia, polyuria  Pt states overall good compliance with meds, trying to follow lower cholesterol diet, wt overall stable but little exercise however. No new complaints.   Pt denies fever, wt loss, night sweats, loss of appetite, or other constitutional symptoms. Denies worsening depressive symptoms, suicidal ideation, or panic, though has ongoing anxiety, not increased recently.  No overt bruiing or bleeding Past Medical History  Diagnosis Date  . HYPERLIPIDEMIA 05/22/2007  . ERECTILE DYSFUNCTION 05/22/2007  . HYPERTENSION 05/22/2007  . ALLERGIC RHINITIS 05/22/2007  . RENAL INSUFFICIENCY 11/18/2009  . BENIGN PROSTATIC HYPERTROPHY 05/22/2007  . COLONIC POLYPS, HX OF 11/19/2007  . SKIN LESION 11/16/2010  . Acute gouty arthropathy 12/13/2010  . Gout 05/17/2011   No past surgical history on file.  reports that he has quit smoking. He does not have any smokeless tobacco history on file. He reports that he drinks alcohol. He reports that he does not use illicit drugs. family history is not on file. No Known Allergies Current Outpatient Prescriptions on File Prior to Visit  Medication Sig Dispense Refill  . aspirin 81 MG EC tablet Take 81 mg by mouth daily.        [name] diltiazem (DILACOR XR) 240 MG 24 hr capsule Take 1 capsule (240 mg total) by mouth daily.  30 capsule  11  . hydrochlorothiazide (,MICROZIDE/HYDRODIURIL,) 12.5 MG capsule Take 1 capsule (12.5 mg total) by mouth daily.  30 capsule  11  . lovastatin (MEVACOR) 20 MG tablet Take 1 tablet (20 mg total) by mouth daily.  30 tablet  11  . traMADol (ULTRAM-ER) 100 MG 24 hr tablet  Take 100 mg by mouth daily. For pain        Current Facility-Administered Medications on File Prior to Visit  Medication Dose Route Frequency Provider Last Rate Last Dose  . pneumococcal 23 valent vaccine (PNU-IMMUNE) injection 0.5 mL  0.5 mL Intramuscular Once Cathlean Cower, MD       Review of Systems Review of Systems  Constitutional: Negative for diaphoresis and unexpected weight change.  HENT: Negative for drooling and tinnitus.   Eyes: Negative for photophobia and visual disturbance.  Respiratory: Negative for choking and stridor.   Gastrointestinal: Negative for vomiting and blood in stool.  Genitourinary: Negative for hematuria and decreased urine volume.    Objective:   Physical Exam BP 120/82  Pulse 78  Temp(Src) 97.6 F (36.4 C) (Oral)  Ht 5\' 9"  (1.753 m)  Wt 174 lb 2 oz (78.983 kg)  BMI 25.71 kg/m2  SpO2 95% Physical Exam  VS noted Constitutional: Pt appears well-developed and well-nourished.  HENT: Head: Normocephalic.  Right Ear: External ear normal.  Left Ear: External ear normal.  Eyes: Conjunctivae and EOM are normal. Pupils are equal, round, and reactive to light.  Neck: Normal range of motion. Neck supple.  Cardiovascular: Normal rate and regular rhythm.   Pulmonary/Chest: Effort normal and breath sounds normal.  Neurological: Pt is alert. No cranial nerve deficit.  Skin: Skin is warm. No erythema. No bruise or bleeding Psychiatric: Pt behavior is normal. Thought content normal.  Assessment & Plan:

## 2011-12-05 NOTE — Assessment & Plan Note (Signed)
stable overall by hx and exam, most recent data reviewed with pt, and pt to continue medical treatment as before  BP Readings from Last 3 Encounters:  12/05/11 120/82  05/17/11 128/82  02/21/11 112/68

## 2012-05-22 ENCOUNTER — Encounter: Payer: Self-pay | Admitting: Internal Medicine

## 2012-05-22 ENCOUNTER — Other Ambulatory Visit (INDEPENDENT_AMBULATORY_CARE_PROVIDER_SITE_OTHER): Payer: Medicare Other

## 2012-05-22 ENCOUNTER — Other Ambulatory Visit: Payer: Self-pay | Admitting: Internal Medicine

## 2012-05-22 DIAGNOSIS — I10 Essential (primary) hypertension: Secondary | ICD-10-CM

## 2012-05-22 DIAGNOSIS — Z0000 Encounter for general adult medical examination without abnormal findings: Secondary | ICD-10-CM

## 2012-05-22 LAB — URINALYSIS, ROUTINE W REFLEX MICROSCOPIC
Bilirubin Urine: NEGATIVE
Ketones, ur: NEGATIVE
Total Protein, Urine: NEGATIVE
pH: 5.5 (ref 5.0–8.0)

## 2012-05-22 LAB — HEPATIC FUNCTION PANEL
AST: 15 U/L (ref 0–37)
Total Bilirubin: 0.4 mg/dL (ref 0.3–1.2)

## 2012-05-22 LAB — CBC WITH DIFFERENTIAL/PLATELET
Basophils Relative: 1.1 % (ref 0.0–3.0)
Eosinophils Relative: 5.1 % — ABNORMAL HIGH (ref 0.0–5.0)
HCT: 38.1 % — ABNORMAL LOW (ref 39.0–52.0)
Hemoglobin: 12.6 g/dL — ABNORMAL LOW (ref 13.0–17.0)
Lymphs Abs: 1.3 10*3/uL (ref 0.7–4.0)
MCV: 91.7 fl (ref 78.0–100.0)
Monocytes Absolute: 0.6 10*3/uL (ref 0.1–1.0)
Neutro Abs: 1.7 10*3/uL (ref 1.4–7.7)
RBC: 4.15 Mil/uL — ABNORMAL LOW (ref 4.22–5.81)
WBC: 3.8 10*3/uL — ABNORMAL LOW (ref 4.5–10.5)

## 2012-05-22 LAB — LIPID PANEL
Cholesterol: 141 mg/dL (ref 0–200)
HDL: 61.7 mg/dL (ref >39.00)
LDL Cholesterol: 69 mg/dL (ref 0–99)
Triglycerides: 50 mg/dL (ref 0.0–149.0)
VLDL: 10 mg/dL (ref 0.0–40.0)

## 2012-05-22 LAB — BASIC METABOLIC PANEL
BUN: 23 mg/dL (ref 6–23)
Creatinine, Ser: 1.5 mg/dL (ref 0.4–1.5)
GFR: 57.6 mL/min — ABNORMAL LOW (ref >60.00)
Potassium: 4.1 mEq/L (ref 3.5–5.1)

## 2012-05-22 MED ORDER — CEPHALEXIN 500 MG PO CAPS: 500.0000 mg | ORAL_CAPSULE | Freq: Four times a day (QID) | ORAL | Status: AC

## 2012-05-27 ENCOUNTER — Ambulatory Visit (INDEPENDENT_AMBULATORY_CARE_PROVIDER_SITE_OTHER): Payer: Medicare Other | Admitting: Internal Medicine

## 2012-05-27 ENCOUNTER — Encounter: Payer: Self-pay | Admitting: Internal Medicine

## 2012-05-27 VITALS — BP 124/72 | HR 62 | Temp 97.1°F | Ht 69.0 in | Wt 174.0 lb

## 2012-05-27 DIAGNOSIS — Z0000 Encounter for general adult medical examination without abnormal findings: Secondary | ICD-10-CM

## 2012-05-27 DIAGNOSIS — N259 Disorder resulting from impaired renal tubular function, unspecified: Secondary | ICD-10-CM

## 2012-05-27 DIAGNOSIS — E785 Hyperlipidemia, unspecified: Secondary | ICD-10-CM

## 2012-05-27 DIAGNOSIS — N390 Urinary tract infection, site not specified: Secondary | ICD-10-CM

## 2012-05-27 DIAGNOSIS — R351 Nocturia: Secondary | ICD-10-CM

## 2012-05-27 DIAGNOSIS — Z136 Encounter for screening for cardiovascular disorders: Secondary | ICD-10-CM

## 2012-05-27 NOTE — Assessment & Plan Note (Signed)

## 2012-05-27 NOTE — Patient Instructions (Addendum)
Continue all other medications as before Please continue your efforts at being more active, low cholesterol diet, and weight control. Please call for urology referral if your urination worsens as far as more often, or change in the stream Please return in 6 mo with Lab testing done 3-5 days before

## 2012-05-31 ENCOUNTER — Encounter: Payer: Self-pay | Admitting: Internal Medicine

## 2012-05-31 DIAGNOSIS — R351 Nocturia: Secondary | ICD-10-CM | POA: Insufficient documentation

## 2012-05-31 DIAGNOSIS — N390 Urinary tract infection, site not specified: Secondary | ICD-10-CM | POA: Insufficient documentation

## 2012-05-31 NOTE — Assessment & Plan Note (Signed)
Mild, to finish antibx,  to f/u any worsening symptoms or concerns

## 2012-05-31 NOTE — Assessment & Plan Note (Signed)
Declines urology

## 2012-05-31 NOTE — Progress Notes (Signed)
Subjective:    Patient ID: Devin Hogan, male    DOB: 11-12-1927, 78 y.o.   MRN: [id number]  HPI  Here for wellness and f/u;  Overall doing ok;  Pt denies CP, worsening SOB, DOE, wheezing, orthopnea, PND, worsening LE edema, palpitations, dizziness or syncope.  Pt denies neurological change such as new Headache, facial or extremity weakness.  Pt denies polydipsia, polyuria, or low sugar symptoms. Pt states overall good compliance with treatment and medications, good tolerability, and trying to follow lower cholesterol diet.  Pt denies worsening depressive symptoms, suicidal ideation or panic. No fever, wt loss, night sweats, loss of appetite, or other constitutional symptoms.  Pt states good ability with ADL's, low fall risk, home safety reviewed and adequate, no significant changes in hearing or vision, and occasionally active with exercise.  No new compalints, overall doing remarkably well for age.  Had minor dysuria last wk, now on antibx after UA for this exam Past Medical History  Diagnosis Date  . HYPERLIPIDEMIA 05/22/2007  . ERECTILE DYSFUNCTION 05/22/2007  . HYPERTENSION 05/22/2007  . ALLERGIC RHINITIS 05/22/2007  . RENAL INSUFFICIENCY 11/18/2009  . BENIGN PROSTATIC HYPERTROPHY 05/22/2007  . COLONIC POLYPS, HX OF 11/19/2007  . SKIN LESION 11/16/2010  . Acute gouty arthropathy 12/13/2010  . Gout 05/17/2011   No past surgical history on file.  reports that he has quit smoking. He does not have any smokeless tobacco history on file. He reports that he drinks alcohol. He reports that he does not use illicit drugs. family history is not on file. No Known Allergies Current Outpatient Prescriptions on File Prior to Visit  Medication Sig Dispense Refill  . aspirin 81 MG EC tablet Take 81 mg by mouth daily.        . cephALEXin (KEFLEX) 500 MG capsule Take 1 capsule (500 mg total) by mouth 4 (four) times daily.  40 capsule  0  . diltiazem (DILACOR XR) 240 MG 24 hr capsule Take 1 capsule (240 mg total)  by mouth daily.  30 capsule  11  . hydrochlorothiazide (,MICROZIDE/HYDRODIURIL,) 12.5 MG capsule Take 1 capsule (12.5 mg total) by mouth daily.  30 capsule  11  . lovastatin (MEVACOR) 20 MG tablet Take 1 tablet (20 mg total) by mouth daily.  30 tablet  11  . traMADol (ULTRAM-ER) 100 MG 24 hr tablet Take 100 mg by mouth daily. For pain        Current Facility-Administered Medications on File Prior to Visit  Medication Dose Route Frequency Provider Last Rate Last Dose  . pneumococcal 23 valent vaccine (PNU-IMMUNE) injection 0.5 mL  0.5 mL Intramuscular Once Biagio Borg, MD       Review of Systems Review of Systems  Constitutional: Negative for diaphoresis, activity change, appetite change and unexpected weight change.  HENT: Negative for hearing loss, ear pain, facial swelling, mouth sores and neck stiffness.   Eyes: Negative for pain, redness and visual disturbance.  Respiratory: Negative for shortness of breath and wheezing.   Cardiovascular: Negative for chest pain and palpitations.  Gastrointestinal: Negative for diarrhea, blood in stool, abdominal distention and rectal pain.  Genitourinary: Negative for hematuria, flank pain and decreased urine volume.  Musculoskeletal: Negative for myalgias and joint swelling.  Skin: Negative for color change and wound.  Neurological: Negative for syncope and numbness.  Hematological: Negative for adenopathy.  Psychiatric/Behavioral: Negative for hallucinations, self-injury, decreased concentration and agitation.      Objective:   Physical Exam BP 124/72  Pulse 62  Temp 97.1 F (36.2 C) (Oral)  Ht 5\' 9"  (1.753 m)  Wt 174 lb (78.926 kg)  BMI 25.70 kg/m2  SpO2 96% Physical Exam  VS noted Constitutional: Pt is oriented to person, place, and time. Appears well-developed and well-nourished.  HENT:  Head: Normocephalic and atraumatic.  Right Ear: External ear normal.  Left Ear: External ear normal.  Nose: Nose normal.  Mouth/Throat:  Oropharynx is clear and moist.  Eyes: Conjunctivae and EOM are normal. Pupils are equal, round, and reactive to light.  Neck: Normal range of motion. Neck supple. No JVD present. No tracheal deviation present.  Cardiovascular: Normal rate, regular rhythm, normal heart sounds and intact distal pulses.   Pulmonary/Chest: Effort normal and breath sounds normal.  Abdominal: Soft. Bowel sounds are normal. There is no tenderness.  Musculoskeletal: Normal range of motion. Exhibits no edema.  Lymphadenopathy:  Has no cervical adenopathy.  Neurological: Pt is alert and oriented to person, place, and time. Pt has normal reflexes. No cranial nerve deficit.  Skin: Skin is warm and dry. No rash noted.  Psychiatric:  Has  normal mood and affect. Behavior is normal.     Assessment & Plan:

## 2012-07-09 ENCOUNTER — Encounter: Payer: Self-pay | Admitting: Gastroenterology

## 2012-07-10 ENCOUNTER — Other Ambulatory Visit: Payer: Self-pay | Admitting: Internal Medicine

## 2012-09-03 ENCOUNTER — Encounter: Payer: Self-pay | Admitting: Internal Medicine

## 2012-09-03 ENCOUNTER — Telehealth: Payer: Self-pay

## 2012-09-03 ENCOUNTER — Ambulatory Visit (INDEPENDENT_AMBULATORY_CARE_PROVIDER_SITE_OTHER): Payer: Medicare Other | Admitting: Internal Medicine

## 2012-09-03 VITALS — BP 118/72 | HR 81 | Temp 97.8°F | Ht 69.0 in | Wt 169.1 lb

## 2012-09-03 DIAGNOSIS — J209 Acute bronchitis, unspecified: Secondary | ICD-10-CM

## 2012-09-03 DIAGNOSIS — I10 Essential (primary) hypertension: Secondary | ICD-10-CM

## 2012-09-03 DIAGNOSIS — M542 Cervicalgia: Secondary | ICD-10-CM

## 2012-09-03 MED ORDER — HYDROCODONE-HOMATROPINE 5-1.5 MG/5ML PO SYRP: 5.0000 mL | ORAL_SOLUTION | Freq: Four times a day (QID) | ORAL | Status: DC | PRN

## 2012-09-03 MED ORDER — PREDNISONE 10 MG PO TABS: 10.0000 mg | ORAL_TABLET | Freq: Every day | ORAL | Status: DC

## 2012-09-03 MED ORDER — TRAMADOL HCL 50 MG PO TABS
50.0000 mg | ORAL_TABLET | Freq: Four times a day (QID) | ORAL | Status: DC | PRN
Start: 2012-09-03 — End: 2015-12-09

## 2012-09-03 MED ORDER — AZITHROMYCIN 250 MG PO TABS: ORAL_TABLET | ORAL | Status: DC

## 2012-09-03 NOTE — Assessment & Plan Note (Signed)
Mild to mod, for antibx course,  to f/u any worsening symptoms or concerns, and cough med prn 

## 2012-09-03 NOTE — Assessment & Plan Note (Signed)
stable overall by hx and exam, most recent data reviewed with pt, and pt to continue medical treatment as before BP Readings from Last 3 Encounters:  09/03/12 118/72  05/27/12 124/72  12/05/11 120/82

## 2012-09-03 NOTE — Assessment & Plan Note (Signed)
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

## 2012-09-03 NOTE — Patient Instructions (Addendum)
Take all new medications as prescribed  - the antibiotic and cough medicine, as well as the pain medication and prednisone for the neck Continue all other medications as before

## 2012-09-03 NOTE — Telephone Encounter (Signed)
Please clarify prednisone instructions. Received fax from pharmacy.

## 2012-09-03 NOTE — Progress Notes (Signed)
Subjective:    Patient ID: Devin Hogan, male    DOB: 1928/09/29, 78 y.o.   MRN: [id number]  HPI  Here with acute onset mild to mod 2-3 days ST, HA, general weakness and malaise, with prod cough greenish sputum, but Pt denies chest pain, increased sob or doe, wheezing, orthopnea, PND, increased LE swelling, palpitations, dizziness or syncope.  Also with mild intermittnet pain for 3-4 days to the left post lat neck, not tender, but is worse to turn head to left, nothing makes better, denies UE radicular pain/weakness/numbness.  No recent trauma, falls, or gait change. And no bowel or bladder change, fever, wt loss,  worsening LE pain/numbness/weakness.   Pt denies fever, wt loss, night sweats, loss of appetite, or other constitutional symptoms except for the above.  Pt denies chest pain, increased sob or doe, wheezing, orthopnea, PND, increased LE swelling, palpitations, dizziness or syncope.  Pt denies new neurological symptoms such as new headache, or facial or extremity weakness or numbness   Pt denies polydipsia, polyuria/ Past Medical History  Diagnosis Date  . HYPERLIPIDEMIA 05/22/2007  . ERECTILE DYSFUNCTION 05/22/2007  . HYPERTENSION 05/22/2007  . ALLERGIC RHINITIS 05/22/2007  . RENAL INSUFFICIENCY 11/18/2009  . BENIGN PROSTATIC HYPERTROPHY 05/22/2007  . COLONIC POLYPS, HX OF 11/19/2007  . SKIN LESION 11/16/2010  . Acute gouty arthropathy 12/13/2010  . Gout 05/17/2011   No past surgical history on file.  reports that he has quit smoking. He does not have any smokeless tobacco history on file. He reports that he drinks alcohol. He reports that he does not use illicit drugs. family history is not on file. No Known Allergies Current Outpatient Prescriptions on File Prior to Visit  Medication Sig Dispense Refill  . aspirin 81 MG EC tablet Take 81 mg by mouth daily.        [name] diltiazem (DILACOR XR) 240 MG 24 hr capsule Take 1 capsule (240 mg total) by mouth daily.  30 capsule  11  .  hydrochlorothiazide (MICROZIDE) 12.5 MG capsule TAKE 1 CAPSULE BY MOUTH DAILY  30 capsule  5  . lovastatin (MEVACOR) 20 MG tablet Take 1 tablet (20 mg total) by mouth daily.  30 tablet  11   Current Facility-Administered Medications on File Prior to Visit  Medication Dose Route Frequency Provider Last Rate Last Dose  . DISCONTD: pneumococcal 23 valent vaccine (PNU-IMMUNE) injection 0.5 mL  0.5 mL Intramuscular Once Biagio Borg, MD       Review of Systems  Constitutional: Negative for diaphoresis and unexpected weight change.  HENT: Negative for tinnitus.   Eyes: Negative for photophobia and visual disturbance.  Respiratory: Negative for choking and stridor.   Gastrointestinal: Negative for vomiting and blood in stool.  Genitourinary: Negative for hematuria and decreased urine volume.  Musculoskeletal: Negative for gait problem.  Skin: Negative for color change and wound.  Neurological: Negative for tremors and numbness.  Psychiatric/Behavioral: Negative for decreased concentration. The patient is not hyperactive.       Objective:   Physical Exam BP 118/72  Pulse 81  Temp 97.8 F (36.6 C) (Oral)  Ht 5\' 9"  (1.753 m)  Wt 169 lb 2 oz (76.715 kg)  BMI 24.98 kg/m2  SpO2 95% Physical Exam  VS noted, mild ill Constitutional: Pt appears well-developed and well-nourished.  HENT: Head: Normocephalic.  Right Ear: External ear normal.  Left Ear: External ear normal.  Bilat tm's mild erythema.  Sinus nontender.  Pharynx mild erythema  Eyes: Conjunctivae and EOM  are normal. Pupils are equal, round, and reactive to light.  Neck: Normal range of motion. Neck supple.  Cardiovascular: Normal rate and regular rhythm.   Pulmonary/Chest: Effort normal and breath sounds mild decrased bilat, no rales or wheezing Neurological: Pt is alert. Not confused , motor/sens/dtr/gait intact Skin: Skin is warm. No erythema. No rash including left neck/arm Psychiatric: Pt behavior is normal. Thought content  normal.     Assessment & Plan:

## 2012-09-03 NOTE — Telephone Encounter (Signed)
rx corrected 

## 2012-09-12 ENCOUNTER — Other Ambulatory Visit: Payer: Self-pay | Admitting: Internal Medicine

## 2012-11-21 ENCOUNTER — Other Ambulatory Visit (INDEPENDENT_AMBULATORY_CARE_PROVIDER_SITE_OTHER): Payer: Medicare Other

## 2012-11-21 DIAGNOSIS — E785 Hyperlipidemia, unspecified: Secondary | ICD-10-CM

## 2012-11-21 DIAGNOSIS — N259 Disorder resulting from impaired renal tubular function, unspecified: Secondary | ICD-10-CM

## 2012-11-21 LAB — BASIC METABOLIC PANEL
BUN: 20 mg/dL (ref 6–23)
Chloride: 109 mEq/L (ref 96–112)
GFR: 51.18 mL/min — ABNORMAL LOW (ref >60.00)
Glucose, Bld: 84 mg/dL (ref 70–99)
Potassium: 3.8 mEq/L (ref 3.5–5.1)

## 2012-11-21 LAB — LIPID PANEL
Cholesterol: 141 mg/dL (ref 0–200)
VLDL: 10.2 mg/dL (ref 0.0–40.0)

## 2012-11-28 ENCOUNTER — Encounter: Payer: Self-pay | Admitting: Internal Medicine

## 2012-11-28 ENCOUNTER — Ambulatory Visit (INDEPENDENT_AMBULATORY_CARE_PROVIDER_SITE_OTHER): Payer: Medicare Other | Admitting: Internal Medicine

## 2012-11-28 VITALS — BP 110/80 | HR 76 | Temp 97.6°F | Ht 69.0 in | Wt 167.1 lb

## 2012-11-28 DIAGNOSIS — E785 Hyperlipidemia, unspecified: Secondary | ICD-10-CM

## 2012-11-28 DIAGNOSIS — Z0000 Encounter for general adult medical examination without abnormal findings: Secondary | ICD-10-CM

## 2012-11-28 DIAGNOSIS — N189 Chronic kidney disease, unspecified: Secondary | ICD-10-CM

## 2012-11-28 DIAGNOSIS — I10 Essential (primary) hypertension: Secondary | ICD-10-CM

## 2012-11-28 NOTE — Patient Instructions (Addendum)
Please continue all other medications as before Please have the pharmacy call with any other refills you may need. Please return if you change your mind about the flu shot Please remember to sign up for My Chart if you have not done so, as this will be important to you in the future with finding out test results, communicating by private email, and scheduling acute appointments online when needed. Please return in 6 months, or sooner if needed, with Lab testing done 3-5 days before

## 2012-11-29 ENCOUNTER — Encounter: Payer: Self-pay | Admitting: Internal Medicine

## 2012-11-29 NOTE — Assessment & Plan Note (Signed)
stable overall by history and exam, recent data reviewed with pt, and pt to continue medical treatment as before,  to f/u any worsening symptoms or concerns BP Readings from Last 3 Encounters:  11/28/12 110/80  09/03/12 118/72  05/27/12 124/72

## 2012-11-29 NOTE — Assessment & Plan Note (Signed)
stable overall by history and exam, recent data reviewed with pt, and pt to continue medical treatment as before,  to f/u any worsening symptoms or concerns Lab Results  Component Value Date   LDLCALC 74 11/21/2012

## 2012-11-29 NOTE — Progress Notes (Signed)
  Subjective:    Patient ID: Devin Hogan, male    DOB: Mar 18, 1928, 79 y.o.   MRN: [id number]  HPI  Here to f/u; overall doing ok,  Pt denies chest pain, increased sob or doe, wheezing, orthopnea, PND, increased LE swelling, palpitations, dizziness or syncope.  Pt denies polydipsia, polyuria, or low sugar symptoms such as weakness or confusion improved with po intake.  Pt denies new neurological symptoms such as new headache, or facial or extremity weakness or numbness.   Pt states overall good compliance with meds, has been trying to follow lower cholesterol, diabetic diet, with wt overall stable,  but little exercise however. No new complaints Past Medical History  Diagnosis Date  . HYPERLIPIDEMIA 05/22/2007  . ERECTILE DYSFUNCTION 05/22/2007  . HYPERTENSION 05/22/2007  . ALLERGIC RHINITIS 05/22/2007  . RENAL INSUFFICIENCY 11/18/2009  . BENIGN PROSTATIC HYPERTROPHY 05/22/2007  . COLONIC POLYPS, HX OF 11/19/2007  . SKIN LESION 11/16/2010  . Acute gouty arthropathy 12/13/2010  . Gout 05/17/2011   No past surgical history on file.  reports that he has quit smoking. He does not have any smokeless tobacco history on file. He reports that he drinks alcohol. He reports that he does not use illicit drugs. family history is not on file. No Known Allergies Current Outpatient Prescriptions on File Prior to Visit  Medication Sig Dispense Refill  . aspirin 81 MG EC tablet Take 81 mg by mouth daily.        [name] diltiazem (CARDIZEM CD) 240 MG 24 hr capsule TAKE 1 CAPSULE (240 MG TOTAL) BY MOUTH DAILY.  30 capsule  11  . hydrochlorothiazide (MICROZIDE) 12.5 MG capsule TAKE 1 CAPSULE BY MOUTH DAILY  30 capsule  5  . lovastatin (MEVACOR) 20 MG tablet TAKE 1 TABLET (20 MG TOTAL) BY MOUTH DAILY.  30 tablet  11  . traMADol (ULTRAM) 50 MG tablet Take 1 tablet (50 mg total) by mouth every 6 (six) hours as needed for pain.  60 tablet  0  . [DISCONTINUED] diltiazem (DILACOR XR) 240 MG 24 hr capsule Take 1 capsule (240 mg  total) by mouth daily.  30 capsule  11   Review of Systems  Constitutional: Negative for unexpected weight change, or unusual diaphoresis  HENT: Negative for tinnitus.   Eyes: Negative for photophobia and visual disturbance.  Respiratory: Negative for choking and stridor.   Gastrointestinal: Negative for vomiting and blood in stool.  Genitourinary: Negative for hematuria and decreased urine volume.  Musculoskeletal: Negative for acute joint swelling Skin: Negative for color change and wound.  Neurological: Negative for tremors and numbness other than noted  Psychiatric/Behavioral: Negative for decreased concentration or  hyperactivity.       Objective:   Physical Exam BP 110/80  Pulse 76  Temp 97.6 F (36.4 C) (Oral)  Ht 5\' 9"  (1.753 m)  Wt 167 lb 2 oz (75.807 kg)  BMI 24.68 kg/m2  SpO2 95% VS noted,  Constitutional: Pt appears well-developed and well-nourished.  HENT: Head: NCAT.  Right Ear: External ear normal.  Left Ear: External ear normal.  Eyes: Conjunctivae and EOM are normal. Pupils are equal, round, and reactive to light.  Neck: Normal range of motion. Neck supple.  Cardiovascular: Normal rate and regular rhythm.   Pulmonary/Chest: Effort normal and breath sounds normal.  Neurological: Pt is alert. Not confused  Skin: Skin is warm. No erythema.  Psychiatric: Pt behavior is normal. Thought content normal.     Assessment & Plan:

## 2012-11-29 NOTE — Assessment & Plan Note (Signed)
.  stable volume, for f/u lab today,  to f/u any worsening symptoms or concerns

## 2012-12-25 ENCOUNTER — Other Ambulatory Visit: Payer: Self-pay | Admitting: Internal Medicine

## 2013-05-28 ENCOUNTER — Ambulatory Visit: Payer: Medicare Other | Admitting: Internal Medicine

## 2013-05-29 ENCOUNTER — Other Ambulatory Visit (INDEPENDENT_AMBULATORY_CARE_PROVIDER_SITE_OTHER): Payer: Medicare Other

## 2013-05-29 DIAGNOSIS — E785 Hyperlipidemia, unspecified: Secondary | ICD-10-CM

## 2013-05-29 DIAGNOSIS — I10 Essential (primary) hypertension: Secondary | ICD-10-CM

## 2013-05-29 DIAGNOSIS — Z0000 Encounter for general adult medical examination without abnormal findings: Secondary | ICD-10-CM

## 2013-05-29 LAB — CBC WITH DIFFERENTIAL/PLATELET
Eosinophils Relative: 4.2 % (ref 0.0–5.0)
HCT: 35.8 % — ABNORMAL LOW (ref 39.0–52.0)
Hemoglobin: 12 g/dL — ABNORMAL LOW (ref 13.0–17.0)
Lymphs Abs: 1 10*3/uL (ref 0.7–4.0)
MCV: 92 fl (ref 78.0–100.0)
Monocytes Relative: 13.8 % — ABNORMAL HIGH (ref 3.0–12.0)
Neutro Abs: 1.7 10*3/uL (ref 1.4–7.7)
RDW: 16 % — ABNORMAL HIGH (ref 11.5–14.6)
WBC: 3.3 10*3/uL — ABNORMAL LOW (ref 4.5–10.5)

## 2013-05-29 LAB — LIPID PANEL
HDL: 57.2 mg/dL (ref >39.00)
LDL Cholesterol: 65 mg/dL (ref 0–99)
Total CHOL/HDL Ratio: 2
VLDL: 8.2 mg/dL (ref 0.0–40.0)

## 2013-05-29 LAB — URINALYSIS, ROUTINE W REFLEX MICROSCOPIC
Ketones, ur: NEGATIVE
Specific Gravity, Urine: 1.02 (ref 1.000–1.030)
Urine Glucose: NEGATIVE
pH: 6 (ref 5.0–8.0)

## 2013-05-29 LAB — HEPATIC FUNCTION PANEL
Albumin: 3.5 g/dL (ref 3.5–5.2)
Alkaline Phosphatase: 44 U/L (ref 39–117)
Total Bilirubin: 0.6 mg/dL (ref 0.3–1.2)

## 2013-05-29 LAB — BASIC METABOLIC PANEL
Calcium: 8.7 mg/dL (ref 8.4–10.5)
GFR: 56.16 mL/min — ABNORMAL LOW (ref >60.00)
Glucose, Bld: 83 mg/dL (ref 70–99)
Sodium: 140 mEq/L (ref 135–145)

## 2013-06-03 ENCOUNTER — Ambulatory Visit (INDEPENDENT_AMBULATORY_CARE_PROVIDER_SITE_OTHER): Payer: Medicare Other | Admitting: Internal Medicine

## 2013-06-03 ENCOUNTER — Encounter: Payer: Self-pay | Admitting: Internal Medicine

## 2013-06-03 VITALS — BP 122/84 | HR 59 | Temp 98.0°F | Ht 69.0 in | Wt 173.0 lb

## 2013-06-03 DIAGNOSIS — Z0000 Encounter for general adult medical examination without abnormal findings: Secondary | ICD-10-CM

## 2013-06-03 NOTE — Progress Notes (Signed)
Subjective:    Patient ID: Devin Hogan, male    DOB: [birth year], 79 y.o.   MRN: [id number]  HPI  Here for wellness and f/u;  Overall doing ok;  Pt denies CP, worsening SOB, DOE, wheezing, orthopnea, PND, worsening LE edema, palpitations, dizziness or syncope.  Pt denies neurological change such as new headache, facial or extremity weakness.  Pt denies polydipsia, polyuria, or low sugar symptoms. Pt states overall good compliance with treatment and medications, good tolerability, and has been trying to follow lower cholesterol diet.  Pt denies worsening depressive symptoms, suicidal ideation or panic. No fever, night sweats, wt loss, loss of appetite, or other constitutional symptoms.  Pt states good ability with ADL's, has low fall risk, home safety reviewed and adequate, no other significant changes in hearing or vision, and only occasionally active with exercise.  No acute complaints Past Medical History  Diagnosis Date  . HYPERLIPIDEMIA 05/22/2007  . ERECTILE DYSFUNCTION 05/22/2007  . HYPERTENSION 05/22/2007  . ALLERGIC RHINITIS 05/22/2007  . RENAL INSUFFICIENCY 11/18/2009  . BENIGN PROSTATIC HYPERTROPHY 05/22/2007  . COLONIC POLYPS, HX OF 11/19/2007  . SKIN LESION 11/16/2010  . Acute gouty arthropathy 12/13/2010  . Gout 05/17/2011   No past surgical history on file.  reports that he has quit smoking. He does not have any smokeless tobacco history on file. He reports that  drinks alcohol. He reports that he does not use illicit drugs. family history is not on file. No Known Allergies Current Outpatient Prescriptions on File Prior to Visit  Medication Sig Dispense Refill  . aspirin 81 MG EC tablet Take 81 mg by mouth daily.        [name] diltiazem (CARDIZEM CD) 240 MG 24 hr capsule TAKE 1 CAPSULE (240 MG TOTAL) BY MOUTH DAILY.  30 capsule  11  . hydrochlorothiazide (MICROZIDE) 12.5 MG capsule TAKE 1 CAPSULE BY MOUTH DAILY  30 capsule  11  . lovastatin (MEVACOR) 20 MG tablet TAKE 1 TABLET (20 MG TOTAL)  BY MOUTH DAILY.  30 tablet  11  . traMADol (ULTRAM) 50 MG tablet Take 1 tablet (50 mg total) by mouth every 6 (six) hours as needed for pain.  60 tablet  0  . [DISCONTINUED] diltiazem (DILACOR XR) 240 MG 24 hr capsule Take 1 capsule (240 mg total) by mouth daily.  30 capsule  11   No current facility-administered medications on file prior to visit.   Review of Systems Constitutional: Negative for diaphoresis, activity change, appetite change or unexpected weight change.  HENT: Negative for hearing loss, ear pain, facial swelling, mouth sores and neck stiffness.   Eyes: Negative for pain, redness and visual disturbance.  Respiratory: Negative for shortness of breath and wheezing.   Cardiovascular: Negative for chest pain and palpitations.  Gastrointestinal: Negative for diarrhea, blood in stool, abdominal distention or other pain Genitourinary: Negative for hematuria, flank pain or change in urine volume.  Musculoskeletal: Negative for myalgias and joint swelling.  Skin: Negative for color change and wound.  Neurological: Negative for syncope and numbness. other than noted Hematological: Negative for adenopathy.  Psychiatric/Behavioral: Negative for hallucinations, self-injury, decreased concentration and agitation.      Objective:   Physical Exam BP 122/84  Pulse 59  Temp(Src) 98 F (36.7 C) (Oral)  Ht 5\' 9"  (1.753 m)  Wt 173 lb (78.472 kg)  BMI 25.54 kg/m2  SpO2 96% VS noted,  Constitutional: Pt is oriented to person, place, and time. Appears well-developed and well-nourished.  Head: Normocephalic and  atraumatic.  Right Ear: External ear normal.  Left Ear: External ear normal.  Nose: Nose normal.  Mouth/Throat: Oropharynx is clear and moist.  Eyes: Conjunctivae and EOM are normal. Pupils are equal, round, and reactive to light.  Neck: Normal range of motion. Neck supple. No JVD present. No tracheal deviation present.  Cardiovascular: Normal rate, regular rhythm, normal heart  sounds and intact distal pulses.   Pulmonary/Chest: Effort normal and breath sounds normal.  Abdominal: Soft. Bowel sounds are normal. There is no tenderness. No HSM  Musculoskeletal: Normal range of motion. Exhibits no edema.  Lymphadenopathy:  Has no cervical adenopathy.  Neurological: Pt is alert and oriented to person, place, and time. Pt has normal reflexes. No cranial nerve deficit.  Skin: Skin is warm and dry. No rash noted.  Psychiatric:  Has  normal mood and affect. Behavior is normal.        Assessment & Plan:

## 2013-06-03 NOTE — Patient Instructions (Signed)
It was good to see you today There are no changes at this time Please continue all other medications as before, and refills have been done if requested. Please have the pharmacy call with any other refills you may need. Please continue your efforts at being more active, low cholesterol diet, and weight control. You are otherwise up to date with prevention measures today.  Please remember to sign up for My Chart if you have not done so, as this will be important to you in the future with finding out test results, communicating by private email, and scheduling acute appointments online when needed.  Please return in 6 months, or sooner if needed

## 2013-06-03 NOTE — Assessment & Plan Note (Signed)

## 2013-10-15 ENCOUNTER — Other Ambulatory Visit: Payer: Self-pay | Admitting: Internal Medicine

## 2013-12-04 ENCOUNTER — Encounter: Payer: Self-pay | Admitting: Internal Medicine

## 2013-12-04 ENCOUNTER — Ambulatory Visit (INDEPENDENT_AMBULATORY_CARE_PROVIDER_SITE_OTHER): Payer: Medicare Other | Admitting: Internal Medicine

## 2013-12-04 VITALS — BP 124/82 | HR 82 | Temp 97.0°F | Ht 69.0 in | Wt 170.4 lb

## 2013-12-04 DIAGNOSIS — Z0000 Encounter for general adult medical examination without abnormal findings: Secondary | ICD-10-CM

## 2013-12-04 DIAGNOSIS — N189 Chronic kidney disease, unspecified: Secondary | ICD-10-CM

## 2013-12-04 DIAGNOSIS — E785 Hyperlipidemia, unspecified: Secondary | ICD-10-CM

## 2013-12-04 DIAGNOSIS — I10 Essential (primary) hypertension: Secondary | ICD-10-CM

## 2013-12-04 DIAGNOSIS — Z23 Encounter for immunization: Secondary | ICD-10-CM

## 2013-12-04 NOTE — Progress Notes (Signed)
Pre-visit discussion using our clinic review tool. No additional management support is needed unless otherwise documented below in the visit note.  

## 2013-12-04 NOTE — Patient Instructions (Addendum)
You had the new Prevnar pneumonia shot today Please continue all other medications as before, and refills have been done if requested. Please have the pharmacy call with any other refills you may need.  Please remember to sign up for My Chart if you have not done so, as this will be important to you in the future with finding out test results, communicating by private email, and scheduling acute appointments online when needed.  Please return in 6 months, or sooner if needed, with Lab testing done 3-5 days before

## 2013-12-04 NOTE — Addendum Note (Signed)
Addended by: Sharon Seller B on: 12/04/2013 10:26 AM   Modules accepted: Orders

## 2013-12-04 NOTE — Assessment & Plan Note (Addendum)
stable overall by history and exam, recent data reviewed with pt, and pt to continue medical treatment as before,  to f/u any worsening symptoms or concerns Lab Results  Component Value Date   WBC 3.3* 05/29/2013   HGB 12.0* 05/29/2013   HCT 35.8* 05/29/2013   PLT 102.0* 05/29/2013   GLUCOSE 83 05/29/2013   CHOL 130 05/29/2013   TRIG 41.0 05/29/2013   HDL 57.20 05/29/2013   LDLCALC 65 05/29/2013   ALT 10 05/29/2013   AST 15 05/29/2013   NA 140 05/29/2013   K 4.1 05/29/2013   CL 110 05/29/2013   CREATININE 1.5 05/29/2013   BUN 17 05/29/2013   CO2 22 05/29/2013   TSH 4.22 05/29/2013   PSA 0.63 05/12/2010   Will forgo labs today as he is asympt, volume stable, and he is not wanting today

## 2013-12-04 NOTE — Assessment & Plan Note (Signed)
stable overall by history and exam, recent data reviewed with pt, and pt to continue medical treatment as before,  to f/u any worsening symptoms or concerns Lab Results  Component Value Date   LDLCALC 65 05/29/2013

## 2013-12-04 NOTE — Progress Notes (Signed)
Subjective:    Patient ID: Devin Hogan, male    DOB: 09/10/1928, 80 y.o.   MRN: [id number]  HPI  Here to f/u; overall doing ok, remarkably well for age.  Pt denies chest pain, increased sob or doe, wheezing, orthopnea, PND, increased LE swelling, palpitations, dizziness or syncope.  Pt denies polydipsia, polyuria,   Pt denies new neurological symptoms such as new headache, or facial or extremity weakness or numbness.   Pt states overall good compliance with meds, has been trying to follow lower cholesterol, diabetic diet, with wt overall stable,  Trying to stay active. No acute complaints today Past Medical History  Diagnosis Date  . HYPERLIPIDEMIA 05/22/2007  . ERECTILE DYSFUNCTION 05/22/2007  . HYPERTENSION 05/22/2007  . ALLERGIC RHINITIS 05/22/2007  . RENAL INSUFFICIENCY 11/18/2009  . BENIGN PROSTATIC HYPERTROPHY 05/22/2007  . COLONIC POLYPS, HX OF 11/19/2007  . SKIN LESION 11/16/2010  . Acute gouty arthropathy 12/13/2010  . Gout 05/17/2011   No past surgical history on file.  reports that he has quit smoking. He does not have any smokeless tobacco history on file. He reports that he drinks alcohol. He reports that he does not use illicit drugs. family history is not on file. No Known Allergies Current Outpatient Prescriptions on File Prior to Visit  Medication Sig Dispense Refill  . aspirin 81 MG EC tablet Take 81 mg by mouth daily.        [name] diltiazem (CARDIZEM CD) 240 MG 24 hr capsule TAKE 1 CAPSULE (240 MG TOTAL) BY MOUTH DAILY.  30 capsule  10  . hydrochlorothiazide (MICROZIDE) 12.5 MG capsule TAKE 1 CAPSULE BY MOUTH DAILY  30 capsule  11  . lovastatin (MEVACOR) 20 MG tablet TAKE 1 TABLET (20 MG TOTAL) BY MOUTH DAILY.  30 tablet  10  . traMADol (ULTRAM) 50 MG tablet Take 1 tablet (50 mg total) by mouth every 6 (six) hours as needed for pain.  60 tablet  0  . [DISCONTINUED] diltiazem (DILACOR XR) 240 MG 24 hr capsule Take 1 capsule (240 mg total) by mouth daily.  30 capsule  11   No  current facility-administered medications on file prior to visit.   Review of Systems  Constitutional: Negative for unexpected weight change, or unusual diaphoresis  HENT: Negative for tinnitus.   Eyes: Negative for photophobia and visual disturbance.  Respiratory: Negative for choking and stridor.   Gastrointestinal: Negative for vomiting and blood in stool.  Genitourinary: Negative for hematuria and decreased urine volume.  Musculoskeletal: Negative for acute joint swelling Skin: Negative for color change and wound.  Neurological: Negative for tremors and numbness other than noted  Psychiatric/Behavioral: Negative for decreased concentration or  hyperactivity.       Objective:   Physical Exam BP 124/82  Pulse 82  Temp(Src) 97 F (36.1 C) (Oral)  Ht 5\' 9"  (1.753 m)  Wt 170 lb 6 oz (77.282 kg)  BMI 25.15 kg/m2  SpO2 99% VS noted, elderly but gets up on exam table with relative ease Constitutional: Pt appears well-developed and well-nourished.  HENT: Head: NCAT.  Right Ear: External ear normal.  Left Ear: External ear normal.  Eyes: Conjunctivae and EOM are normal. Pupils are equal, round, and reactive to light.  Neck: Normal range of motion. Neck supple.  Cardiovascular: Normal rate and regular rhythm.   Pulmonary/Chest: Effort normal and breath sounds normal. - no rales or wheezing Abd:  Soft, NT, non-distended, + BS Neurological: Pt is alert. Not confused  Skin: Skin  is warm. No erythema.  Psychiatric: Pt behavior is normal. Thought content normal.     Assessment & Plan:

## 2013-12-04 NOTE — Assessment & Plan Note (Signed)
stable overall by history and exam, recent data reviewed with pt, and pt to continue medical treatment as before,  to f/u any worsening symptoms or concerns BP Readings from Last 3 Encounters:  12/04/13 124/82  06/03/13 122/84  11/28/12 110/80

## 2014-01-28 ENCOUNTER — Other Ambulatory Visit: Payer: Self-pay | Admitting: Internal Medicine

## 2014-06-04 ENCOUNTER — Ambulatory Visit: Payer: Medicare Other | Admitting: Internal Medicine

## 2014-06-09 ENCOUNTER — Other Ambulatory Visit: Payer: Self-pay | Admitting: Internal Medicine

## 2014-06-09 ENCOUNTER — Ambulatory Visit (INDEPENDENT_AMBULATORY_CARE_PROVIDER_SITE_OTHER): Payer: Medicare Other | Admitting: Internal Medicine

## 2014-06-09 ENCOUNTER — Encounter: Payer: Self-pay | Admitting: Internal Medicine

## 2014-06-09 ENCOUNTER — Other Ambulatory Visit (INDEPENDENT_AMBULATORY_CARE_PROVIDER_SITE_OTHER): Payer: Medicare Other

## 2014-06-09 VITALS — BP 135/75 | HR 62 | Temp 97.7°F | Wt 165.2 lb

## 2014-06-09 DIAGNOSIS — Z0000 Encounter for general adult medical examination without abnormal findings: Secondary | ICD-10-CM

## 2014-06-09 DIAGNOSIS — I10 Essential (primary) hypertension: Secondary | ICD-10-CM

## 2014-06-09 LAB — TSH: TSH: 2.38 u[IU]/mL (ref 0.35–4.50)

## 2014-06-09 LAB — URINALYSIS, ROUTINE W REFLEX MICROSCOPIC
BILIRUBIN URINE: NEGATIVE
Ketones, ur: NEGATIVE
Nitrite: POSITIVE — AB
Specific Gravity, Urine: 1.02 (ref 1.000–1.030)
Total Protein, Urine: NEGATIVE
Urine Glucose: NEGATIVE
Urobilinogen, UA: 0.2 (ref 0.0–1.0)
pH: 5.5 (ref 5.0–8.0)

## 2014-06-09 LAB — LIPID PANEL
CHOLESTEROL: 149 mg/dL (ref 0–200)
HDL: 57.5 mg/dL (ref >39.00)
LDL Cholesterol: 79 mg/dL (ref 0–99)
NonHDL: 91.5
Total CHOL/HDL Ratio: 3
Triglycerides: 62 mg/dL (ref 0.0–149.0)
VLDL: 12.4 mg/dL (ref 0.0–40.0)

## 2014-06-09 LAB — HEPATIC FUNCTION PANEL
ALBUMIN: 3.9 g/dL (ref 3.5–5.2)
ALT: 6 U/L (ref 0–53)
AST: 19 U/L (ref 0–37)
Alkaline Phosphatase: 46 U/L (ref 39–117)
Bilirubin, Direct: 0.1 mg/dL (ref 0.0–0.3)
TOTAL PROTEIN: 7.2 g/dL (ref 6.0–8.3)
Total Bilirubin: 0.6 mg/dL (ref 0.2–1.2)

## 2014-06-09 LAB — CBC WITH DIFFERENTIAL/PLATELET
BASOS PCT: 0.7 % (ref 0.0–3.0)
Basophils Absolute: 0 10*3/uL (ref 0.0–0.1)
EOS ABS: 0.1 10*3/uL (ref 0.0–0.7)
Eosinophils Relative: 3.7 % (ref 0.0–5.0)
HCT: 38.9 % — ABNORMAL LOW (ref 39.0–52.0)
Hemoglobin: 12.6 g/dL — ABNORMAL LOW (ref 13.0–17.0)
LYMPHS PCT: 32.4 % (ref 12.0–46.0)
Lymphs Abs: 1.1 10*3/uL (ref 0.7–4.0)
MCHC: 32.5 g/dL (ref 30.0–36.0)
MCV: 93.3 fl (ref 78.0–100.0)
MONOS PCT: 12.1 % — AB (ref 3.0–12.0)
Monocytes Absolute: 0.4 10*3/uL (ref 0.1–1.0)
NEUTROS PCT: 51.1 % (ref 43.0–77.0)
Neutro Abs: 1.8 10*3/uL (ref 1.4–7.7)
Platelets: 91 10*3/uL — ABNORMAL LOW (ref 150.0–400.0)
RBC: 4.17 Mil/uL — AB (ref 4.22–5.81)
RDW: 15.2 % (ref 11.5–15.5)
WBC: 3.5 10*3/uL — ABNORMAL LOW (ref 4.0–10.5)

## 2014-06-09 LAB — BASIC METABOLIC PANEL
BUN: 24 mg/dL — ABNORMAL HIGH (ref 6–23)
CHLORIDE: 106 meq/L (ref 96–112)
CO2: 25 mEq/L (ref 19–32)
Calcium: 9 mg/dL (ref 8.4–10.5)
Creatinine, Ser: 1.8 mg/dL — ABNORMAL HIGH (ref 0.4–1.5)
GFR: 47.97 mL/min — AB (ref >60.00)
GLUCOSE: 85 mg/dL (ref 70–99)
POTASSIUM: 4 meq/L (ref 3.5–5.1)
Sodium: 137 mEq/L (ref 135–145)

## 2014-06-09 MED ORDER — CEPHALEXIN 500 MG PO CAPS: 500.0000 mg | ORAL_CAPSULE | Freq: Four times a day (QID) | ORAL | Status: DC

## 2014-06-09 NOTE — Progress Notes (Signed)
Pre visit review using our clinic review tool, if applicable. No additional management support is needed unless otherwise documented below in the visit note. 

## 2014-06-09 NOTE — Progress Notes (Signed)
Subjective:    Patient ID: Devin Hogan, male    DOB: 1928/02/11, 80 y.o.   MRN: [id number]  HPI  Here for wellness and f/u;  Overall doing ok;  Pt denies CP, worsening SOB, DOE, wheezing, orthopnea, PND, worsening LE edema, palpitations, dizziness or syncope.  Pt denies neurological change such as new headache, facial or extremity weakness.  Pt denies polydipsia, polyuria, or low sugar symptoms. Pt states overall good compliance with treatment and medications, good tolerability, and has been trying to follow lower cholesterol diet.  Pt denies worsening depressive symptoms, suicidal ideation or panic. No fever, night sweats, wt loss, loss of appetite, or other constitutional symptoms.  Pt states good ability with ADL's, has low fall risk, home safety reviewed and adequate, no other significant changes in hearing or vision, and only occasionally active with exercise.  Mentions nocturia 2-3 times per night, does not bother him.  No acute complaints Past Medical History  Diagnosis Date  . HYPERLIPIDEMIA 05/22/2007  . ERECTILE DYSFUNCTION 05/22/2007  . HYPERTENSION 05/22/2007  . ALLERGIC RHINITIS 05/22/2007  . RENAL INSUFFICIENCY 11/18/2009  . BENIGN PROSTATIC HYPERTROPHY 05/22/2007  . COLONIC POLYPS, HX OF 11/19/2007  . SKIN LESION 11/16/2010  . Acute gouty arthropathy 12/13/2010  . Gout 05/17/2011   No past surgical history on file.  reports that he has quit smoking. He does not have any smokeless tobacco history on file. He reports that he drinks alcohol. He reports that he does not use illicit drugs. family history is not on file. No Known Allergies Current Outpatient Prescriptions on File Prior to Visit  Medication Sig Dispense Refill  . aspirin 81 MG EC tablet Take 81 mg by mouth daily.        [name] diltiazem (CARDIZEM CD) 240 MG 24 hr capsule TAKE 1 CAPSULE (240 MG TOTAL) BY MOUTH DAILY.  30 capsule  10  . hydrochlorothiazide (MICROZIDE) 12.5 MG capsule TAKE 1 CAPSULE BY MOUTH DAILY  30 capsule  11    . lovastatin (MEVACOR) 20 MG tablet TAKE 1 TABLET (20 MG TOTAL) BY MOUTH DAILY.  30 tablet  10  . traMADol (ULTRAM) 50 MG tablet Take 1 tablet (50 mg total) by mouth every 6 (six) hours as needed for pain.  60 tablet  0  . [DISCONTINUED] diltiazem (DILACOR XR) 240 MG 24 hr capsule Take 1 capsule (240 mg total) by mouth daily.  30 capsule  11   No current facility-administered medications on file prior to visit.    Review of Systems Constitutional: Negative for increased diaphoresis, other activity, appetite or other siginficant weight change  HENT: Negative for worsening hearing loss, ear pain, facial swelling, mouth sores and neck stiffness.   Eyes: Negative for other worsening pain, redness or visual disturbance.  Respiratory: Negative for shortness of breath and wheezing.   Cardiovascular: Negative for chest pain and palpitations.  Gastrointestinal: Negative for diarrhea, blood in stool, abdominal distention or other pain Genitourinary: Negative for hematuria, flank pain or change in urine volume.  Musculoskeletal: Negative for myalgias or other joint complaints.  Skin: Negative for color change and wound.  Neurological: Negative for syncope and numbness. other than noted Hematological: Negative for adenopathy. or other swelling Psychiatric/Behavioral: Negative for hallucinations, self-injury, decreased concentration or other worsening agitation.      Objective:   Physical Exam BP 135/75  Pulse 62  Temp(Src) 97.7 F (36.5 C) (Oral)  Wt 165 lb 4 oz (74.957 kg)  SpO2 99% VS noted,  Constitutional: Pt  is oriented to person, place, and time. Appears well-developed and well-nourished.  Head: Normocephalic and atraumatic.  Right Ear: External ear normal.  Left Ear: External ear normal.  Nose: Nose normal.  Mouth/Throat: Oropharynx is clear and moist.  Eyes: Conjunctivae and EOM are normal. Pupils are equal, round, and reactive to light.  Neck: Normal range of motion. Neck supple.  No JVD present. No tracheal deviation present.  Cardiovascular: Normal rate, regular rhythm, normal heart sounds and intact distal pulses.   Pulmonary/Chest: Effort normal and breath sounds without rales or wheezing  Abdominal: Soft. Bowel sounds are normal. NT. No HSM  Musculoskeletal: Normal range of motion. Exhibits no edema.  Lymphadenopathy:  Has no cervical adenopathy.  Neurological: Pt is alert and oriented to person, place, and time. Pt has normal reflexes. No cranial nerve deficit. Motor grossly intact Skin: Skin is warm and dry. No rash noted.  Psychiatric:  Has normal mood and affect. Behavior is normal.     Assessment & Plan:

## 2014-06-09 NOTE — Patient Instructions (Signed)

## 2014-06-13 NOTE — Assessment & Plan Note (Signed)
stable overall by history and exam, recent data reviewed with pt, and pt to continue medical treatment as before,  to f/u any worsening symptoms or concerns BP Readings from Last 3 Encounters:  06/09/14 135/75  12/04/13 124/82  06/03/13 122/84

## 2014-06-13 NOTE — Assessment & Plan Note (Signed)

## 2014-10-17 ENCOUNTER — Other Ambulatory Visit: Payer: Self-pay | Admitting: Internal Medicine

## 2014-11-12 ENCOUNTER — Encounter: Payer: Self-pay | Admitting: Gastroenterology

## 2014-12-10 ENCOUNTER — Encounter: Payer: Self-pay | Admitting: Internal Medicine

## 2014-12-10 ENCOUNTER — Ambulatory Visit (INDEPENDENT_AMBULATORY_CARE_PROVIDER_SITE_OTHER): Payer: Medicare Other | Admitting: Internal Medicine

## 2014-12-10 ENCOUNTER — Other Ambulatory Visit (INDEPENDENT_AMBULATORY_CARE_PROVIDER_SITE_OTHER): Payer: Medicare Other

## 2014-12-10 VITALS — BP 130/81 | HR 72 | Temp 97.8°F | Ht 69.0 in | Wt 171.4 lb

## 2014-12-10 DIAGNOSIS — I10 Essential (primary) hypertension: Secondary | ICD-10-CM

## 2014-12-10 DIAGNOSIS — N390 Urinary tract infection, site not specified: Secondary | ICD-10-CM

## 2014-12-10 DIAGNOSIS — N183 Chronic kidney disease, stage 3 (moderate): Secondary | ICD-10-CM

## 2014-12-10 DIAGNOSIS — Z0189 Encounter for other specified special examinations: Secondary | ICD-10-CM

## 2014-12-10 DIAGNOSIS — Z0000 Encounter for general adult medical examination without abnormal findings: Secondary | ICD-10-CM

## 2014-12-10 DIAGNOSIS — E785 Hyperlipidemia, unspecified: Secondary | ICD-10-CM

## 2014-12-10 LAB — URINALYSIS, ROUTINE W REFLEX MICROSCOPIC
Bilirubin Urine: NEGATIVE
Ketones, ur: NEGATIVE
Nitrite: POSITIVE — AB
Specific Gravity, Urine: 1.025 (ref 1.000–1.030)
Total Protein, Urine: NEGATIVE
UROBILINOGEN UA: 0.2 (ref 0.0–1.0)
Urine Glucose: NEGATIVE
pH: 5.5 (ref 5.0–8.0)

## 2014-12-10 LAB — BASIC METABOLIC PANEL
BUN: 26 mg/dL — AB (ref 6–23)
CO2: 25 meq/L (ref 19–32)
CREATININE: 1.78 mg/dL — AB (ref 0.40–1.50)
Calcium: 9 mg/dL (ref 8.4–10.5)
Chloride: 108 mEq/L (ref 96–112)
GFR: 46.99 mL/min — ABNORMAL LOW (ref >60.00)
Glucose, Bld: 93 mg/dL (ref 70–99)
Potassium: 4.2 mEq/L (ref 3.5–5.1)
SODIUM: 139 meq/L (ref 135–145)

## 2014-12-10 NOTE — Assessment & Plan Note (Signed)
Stable volume, for f/u labs today, cont microzide,  to f/u any worsening symptoms or concerns

## 2014-12-10 NOTE — Assessment & Plan Note (Signed)
stable overall by history and exam, recent data reviewed with pt, and pt to continue medical treatment as before,  to f/u any worsening symptoms or concerns Lab Results  Component Value Date   LDLCALC 79 06/09/2014

## 2014-12-10 NOTE — Assessment & Plan Note (Signed)
Surprise finding last visit, for f/u UA today, remains afeb and asympt,  to f/u any worsening symptoms or concerns

## 2014-12-10 NOTE — Progress Notes (Signed)
Pre visit review using our clinic review tool, if applicable. No additional management support is needed unless otherwise documented below in the visit note. 

## 2014-12-10 NOTE — Progress Notes (Signed)
Subjective:    Patient ID: Devin Hogan, male    DOB: February 17, 1928, 81 y.o.   MRN: [id number]  HPI  Here to f/u; overall doing ok,  Pt denies chest pain, increased sob or doe, wheezing, orthopnea, PND, increased LE swelling, palpitations, dizziness or syncope.  Pt denies polydipsia, polyuria, or low sugar symptoms such as weakness or confusion improved with po intake.  Pt denies new neurological symptoms such as new headache, or facial or extremity weakness or numbness.   Pt states overall good compliance with meds, has been trying to follow lower cholesterol diet, with wt overall stable, appetite remains ok.  Denies urinary symptoms such as dysuria, frequency, urgency, flank pain, hematuria or n/v, fever, chills,  Tx for UTI last visit, also has known CKD Past Medical History  Diagnosis Date  . HYPERLIPIDEMIA 05/22/2007  . ERECTILE DYSFUNCTION 05/22/2007  . HYPERTENSION 05/22/2007  . ALLERGIC RHINITIS 05/22/2007  . RENAL INSUFFICIENCY 11/18/2009  . BENIGN PROSTATIC HYPERTROPHY 05/22/2007  . COLONIC POLYPS, HX OF 11/19/2007  . SKIN LESION 11/16/2010  . Acute gouty arthropathy 12/13/2010  . Gout 05/17/2011   No past surgical history on file.  reports that he has quit smoking. He does not have any smokeless tobacco history on file. He reports that he drinks alcohol. He reports that he does not use illicit drugs. family history is not on file. No Known Allergies Current Outpatient Prescriptions on File Prior to Visit  Medication Sig Dispense Refill  . aspirin 81 MG EC tablet Take 81 mg by mouth daily.      [name] diltiazem (CARDIZEM CD) 240 MG 24 hr capsule TAKE 1 CAPSULE DAILY 30 capsule 5  . hydrochlorothiazide (MICROZIDE) 12.5 MG capsule TAKE 1 CAPSULE BY MOUTH DAILY 30 capsule 11  . lovastatin (MEVACOR) 20 MG tablet TAKE 1 TABLET DAILY 30 tablet 5  . traMADol (ULTRAM) 50 MG tablet Take 1 tablet (50 mg total) by mouth every 6 (six) hours as needed for pain. 60 tablet 0  . [DISCONTINUED] diltiazem  (DILACOR XR) 240 MG 24 hr capsule Take 1 capsule (240 mg total) by mouth daily. 30 capsule 11   No current facility-administered medications on file prior to visit.   Review of Systems  Constitutional: Negative for unusual diaphoresis or other sweats  HENT: Negative for ringing in ear Eyes: Negative for double vision or worsening visual disturbance.  Respiratory: Negative for choking and stridor.   Gastrointestinal: Negative for vomiting or other signifcant bowel change Genitourinary: Negative for hematuria or decreased urine volume.  Musculoskeletal: Negative for other MSK pain or swelling Skin: Negative for color change and worsening wound.  Neurological: Negative for tremors and numbness other than noted  Psychiatric/Behavioral: Negative for decreased concentration or agitation other than above       Objective:   Physical Exam BP 130/81 mmHg  Pulse 72  Temp(Src) 97.8 F (36.6 C) (Oral)  Ht 5\' 9"  (1.753 m)  Wt 171 lb 6 oz (77.735 kg)  BMI 25.30 kg/m2  SpO2 98% VS noted,  Constitutional: Pt appears well-developed, well-nourished.  HENT: Head: NCAT.  Right Ear: External ear normal.  Left Ear: External ear normal.  Eyes: . Pupils are equal, round, and reactive to light. Conjunctivae and EOM are normal Neck: Normal range of motion. Neck supple.  Cardiovascular: Normal rate and regular rhythm.   Pulmonary/Chest: Effort normal and breath sounds without rales or wheezing.  Abd:  Soft, NT, ND, + BS Neurological: Pt is alert. Not confused , motor  grossly intact Skin: Skin is warm. No rash - no LE edema Psychiatric: Pt behavior is normal. No agitation.     Assessment & Plan:

## 2014-12-10 NOTE — Assessment & Plan Note (Signed)
stable overall by history and exam, recent data reviewed with pt, and pt to continue medical treatment as before,  to f/u any worsening symptoms or concerns BP Readings from Last 3 Encounters:  12/10/14 130/81  06/09/14 135/75  12/04/13 124/82

## 2014-12-10 NOTE — Patient Instructions (Signed)

## 2015-01-12 ENCOUNTER — Other Ambulatory Visit: Payer: Self-pay | Admitting: Internal Medicine

## 2015-03-07 ENCOUNTER — Telehealth: Payer: Self-pay

## 2015-03-07 NOTE — Telephone Encounter (Signed)
Spoke to Mr. Wilcoxen; states he feels "pretty good" and was just in the office in Feb. He and his wife come q 6 months. May consider wellness visit in July prior to August apt.

## 2015-04-09 ENCOUNTER — Other Ambulatory Visit: Payer: Self-pay | Admitting: Internal Medicine

## 2015-05-16 ENCOUNTER — Telehealth: Payer: Self-pay

## 2015-05-16 NOTE — Telephone Encounter (Signed)
To call to Mr. Devin Hogan and lvm to discuss coming in for AWV prior to CPE in August

## 2015-05-18 ENCOUNTER — Telehealth: Payer: Self-pay

## 2015-05-18 NOTE — Telephone Encounter (Signed)
Wife called back and made apt for Devin Hogan to come in for AWV July 18th at 9:30

## 2015-05-23 ENCOUNTER — Ambulatory Visit (INDEPENDENT_AMBULATORY_CARE_PROVIDER_SITE_OTHER): Payer: Medicare Other

## 2015-05-23 VITALS — BP 110/60 | HR 91 | Ht 69.0 in | Wt 169.0 lb

## 2015-05-23 DIAGNOSIS — Z0000 Encounter for general adult medical examination without abnormal findings: Secondary | ICD-10-CM

## 2015-05-23 NOTE — Progress Notes (Addendum)
Subjective:   Devin Hogan is a 81 y.o. male who presents for Medicare Annual/Subsequent preventive examination.  Review of Systems:  HRA assessment completed during visit; Raees [place] Patient is here for Annual Wellness Assessment The Patient was informed that this wellness visit is to identify risk and educate on how to reduce risk for increase disease through lifestyle changes.  The patient verbalized understanding that any voiced medical issues still need to be directed to their physician at a follow up visit as this is not a "physical exam" in which medical issues are addressed and treated.  Personalized education given regarding risk on the problem list that are currently being medically managed;  Lifestyle changes reviewed in lieu of HTN; Hyperlipidemia; and kidney disease EF 60 to 65% per echo on 02/2011 HDL 57;  Health is Good; Diet: wife cooks; oatmeal; tomato sandwich for lunch; chicken and vegetables Supper at hs; [employer] and bake; fruit Exercise: mowing the yard;   Home is one level;  Falls: no falls  Smoke detectors; one level;  Wife has MS; uses walker at home due to legs Uses w/c when out. No falls; Independent;   5 children and 11 grands and 16 great-grands Family in all the time   Screenings overdue: none noted Ophthalmology exam; every 2 years; at [employer];  Immunizations; up to date Shingles- completed Colonoscopy; August 2006 EKG 05/27/12 Hearing: 2000 hz Dental: No dentures;    Cardiac Risk Factors include: advanced age (>54men, >40 women);dyslipidemia;male gender     Objective:    Vitals: BP 110/60 mmHg  Pulse 91  Ht 5\' 9"  (1.753 m)  Wt 169 lb (76.658 kg)  BMI 24.95 kg/m2  SpO2 95%  Tobacco History  Smoking status  . Former Smoker  Smokeless tobacco  . 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 HYPERLIPIDEMIA 05/22/2007  . ERECTILE DYSFUNCTION  05/22/2007  . HYPERTENSION 05/22/2007  . ALLERGIC RHINITIS 05/22/2007  . RENAL INSUFFICIENCY 11/18/2009  . BENIGN PROSTATIC HYPERTROPHY 05/22/2007  . COLONIC POLYPS, HX OF 11/19/2007  . SKIN LESION 11/16/2010  . Acute gouty arthropathy 12/13/2010  . Gout 05/17/2011   History reviewed. No pertinent past surgical history. History reviewed. No pertinent family history. History  Sexual Activity  . Sexual Activity: Not on file    Outpatient Encounter Prescriptions as of 05/23/2015  Medication Sig  . aspirin 81 MG EC tablet Take 81 mg by mouth daily.    [name] diltiazem (CARDIZEM CD) 240 MG 24 hr capsule TAKE 1 CAPSULE DAILY  . hydrochlorothiazide (MICROZIDE) 12.5 MG capsule TAKE 1 CAPSULE BY MOUTH DAILY  . lovastatin (MEVACOR) 20 MG tablet TAKE 1 TABLET BY MOUTH EVERY DAY  . traMADol (ULTRAM) 50 MG tablet Take 1 tablet (50 mg total) by mouth every 6 (six) hours as needed for pain. (Patient not taking: Reported on 05/23/2015)   No facility-administered encounter medications on file as of 05/23/2015.    Activities of Daily Living In your present state of health, do you have any difficulty performing the following activities: 05/23/2015  Hearing? N  Vision? N  Difficulty concentrating or making decisions? N  Walking or climbing stairs? N  Dressing or bathing? N  Doing errands, shopping? N  Preparing Food and eating ? N  Using the Toilet? N  In the past six months, have you accidently leaked urine? N  Do you  have problems with loss of bowel control? N  Managing your Medications? N  Managing your Finances? N  Housekeeping or managing your Housekeeping? N    Patient Care Team: Biagio Borg, MD as PCP - General   Assessment:    Objective:   The goal of the wellness visit is to assist the patient how to close the gaps in care and create a preventative care plan for the patient.  Personalized Education was given regarding:   Pt determined a personalized goal; see patient goals;  Assessment  included:  Taking meds without issues; no barriers identified  Labs were and fup visit noted with MD if labs are due to be re-drawn.  Stress: Recommendations for managing stress if assessed as a factor;   No Risk for hepatitis or high risk social behavior identified via hepatitis screen  Educated on shingles and follow up with insurance company for co-pays or charges applied to Part D benefit. Educated on Vaccines;  Safety issues reviewed;  Cognition assessed by AD8; Score 0 MMSE deferred as the patient stated they had no memory issues; No identified risk were noted; The patient was oriented x 3; appropriate in dress and manner and no objective failures at ADL's or IADL's.   Depression screen negative;   Functional status reviewed; no losses in function x 1 year  Bowel and bladder issues assessed  End of life planning was discussed; aging in home or other; plans to complete HCPOA were discussed if not completed    Exercise Activities and Dietary recommendations Current Exercise Habits:: Home exercise routine, Type of exercise: walking, Time (Minutes): 60, Frequency (Times/Week): 5, Weekly Exercise (Minutes/Week): 300, Intensity: Moderate  Goals    . wants to maintain health     Does walk and work everyday in the yard;       Fall Risk Fall Risk  05/23/2015 06/09/2014 12/04/2013 06/03/2013  Falls in the past year? No No No No   Depression Screen PHQ 2/9 Scores 05/23/2015 06/09/2014 12/04/2013 06/03/2013  PHQ - 2 Score 0 0 0 0    Cognitive Testing MMSE - Mini Mental State Exam 05/23/2015  Not completed: Unable to complete    Immunization History  Administered Date(s) Administered  . Pneumococcal Conjugate-13 12/04/2013  . Pneumococcal Polysaccharide-23 05/06/2006, 05/17/2011  . Td 11/05/1996, 11/18/2008  . Zoster 05/11/2006   Screening Tests Health Maintenance  Topic Date Due  . INFLUENZA VACCINE  12/23/2015 (Originally 06/06/2015)  . COLONOSCOPY  06/29/2015  .  TETANUS/TDAP  11/18/2018  . ZOSTAVAX  Completed  . PNA vac Low Risk Adult  Completed      Plan:     The patient is up to date on screens; Risk due to dental issues; but states he has no problem eating. Some hearing loss but denies issues at this time. Caregiver to spouse with MS, but lives in one level home with good family support. No Memory issues identified; BMI normal; Still maintains the yard Encouraged sunscreen when out  But does wear a hat. Information given on home safety.   During the course of the visit the patient was educated and counseled about the following appropriate screening and preventive services:   Vaccines to include Pneumoccal, Influenza, Hepatitis B, Td, Zostavax, HCV  Electrocardiogram/ completed  Cardiovascular Disease/ no issues chest pain  Colorectal cancer screening - aged out  Diabetes screening n/a  Prostate Cancer Screening completed  Glaucoma screening / no vision issues  Nutrition counseling  Patient Instructions (the written plan) was  given to the patient.  Reviewed all screens and all up to date; but did encourage eye exam this year.  Wynetta Fines, RN  05/23/2015  Medical screening examination/treatment/procedure(s) were performed by non-physician practitioner and as supervising physician I was immediately available for consultation/collaboration. I agree with above. Cathlean Cower, MD

## 2015-05-23 NOTE — Patient Instructions (Addendum)
Mr. Devin Hogan , Thank you for taking time to come for your Medicare Wellness Visit. I appreciate your ongoing commitment to your health goals. Please review the following plan we discussed and let me know if I can assist you in the future.   These are the goals we discussed: Goals    . wants to maintain health     Does walk and work everyday in the yard;        This is a list of the screening recommended for you and due dates:  Health Maintenance  Topic Date Due  . Flu Shot  12/23/2015*  . Colon Cancer Screening  06/29/2015  . Tetanus Vaccine  11/18/2018  . Shingles Vaccine  Completed  . Pneumonia vaccines  Completed  *Topic was postponed. The date shown is not the original due date.     Fall Prevention and Home Safety Falls cause injuries and can affect all age groups. It is possible to prevent falls.  HOW TO PREVENT FALLS  Wear shoes with rubber soles that do not have an opening for your toes.  Keep the inside and outside of your house well lit.  Use night lights throughout your home.  Remove clutter from floors.  Clean up floor spills.  Remove throw rugs or fasten them to the floor with carpet tape.  Do not place electrical cords across pathways.  Put grab bars by your tub, shower, and toilet. Do not use towel bars as grab bars.  Put handrails on both sides of the stairway. Fix loose handrails.  Do not climb on stools or stepladders, if possible.  Do not wax your floors.  Repair uneven or unsafe sidewalks, walkways, or stairs.  Keep items you use a lot within reach.  Be aware of pets.  Keep emergency numbers next to the telephone.  Put smoke detectors in your home and near bedrooms. Ask your doctor what other things you can do to prevent falls. Document Released: 08/18/2009 Document Revised: 04/22/2012 Document Reviewed: 01/22/2012 [facility] Patient Information 2015 [place], [place]. This information is not intended to replace advice given to you by your  health care provider. Make sure you discuss any questions you have with your health care provider.  Health Maintenance A healthy lifestyle and preventative care can promote health and wellness.  Maintain regular health, dental, and eye exams.  Eat a healthy diet. Foods like vegetables, fruits, whole grains, low-fat dairy products, and lean protein foods contain the nutrients you need and are low in calories. Decrease your intake of foods high in solid fats, added sugars, and salt. Get information about a proper diet from your health care provider, if necessary.  Regular physical exercise is one of the most important things you can do for your health. Most adults should get at least 150 minutes of moderate-intensity exercise (any activity that increases your heart rate and causes you to sweat) each week. In addition, most adults need muscle-strengthening exercises on 2 or more days a week.   Maintain a healthy weight. The body mass index (BMI) is a screening tool to identify possible weight problems. It provides an estimate of body fat based on height and weight. Your health care provider can find your BMI and can help you achieve or maintain a healthy weight. For males 20 years and older:  A BMI below 18.5 is considered underweight.  A BMI of 18.5 to 24.9 is normal.  A BMI of 25 to 29.9 is considered overweight.  A BMI of 30  and above is considered obese.  Maintain normal blood lipids and cholesterol by exercising and minimizing your intake of saturated fat. Eat a balanced diet with plenty of fruits and vegetables. Blood tests for lipids and cholesterol should begin at age 4 and be repeated every 5 years. If your lipid or cholesterol levels are high, you are over age 30, or you are at high risk for heart disease, you may need your cholesterol levels checked more frequently.Ongoing high lipid and cholesterol levels should be treated with medicines if diet and exercise are not working.  If  you smoke, find out from your health care provider how to quit. If you do not use tobacco, do not start.  Lung cancer screening is recommended for adults aged 72-80 years who are at high risk for developing lung cancer because of a history of smoking. A yearly low-dose CT scan of the lungs is recommended for people who have at least a 30-pack-year history of smoking and are current smokers or have quit within the past 15 years. A pack year of smoking is smoking an average of 1 pack of cigarettes a day for 1 year (for example, a 30-pack-year history of smoking could mean smoking 1 pack a day for 30 years or 2 packs a day for 15 years). Yearly screening should continue until the smoker has stopped smoking for at least 15 years. Yearly screening should be stopped for people who develop a health problem that would prevent them from having lung cancer treatment.  If you choose to drink alcohol, do not have more than 2 drinks per day. One drink is considered to be 12 oz (360 mL) of beer, 5 oz (150 mL) of wine, or 1.5 oz (45 mL) of liquor.  Avoid the use of street drugs. Do not share needles with anyone. Ask for help if you need support or instructions about stopping the use of drugs.  High blood pressure causes heart disease and increases the risk of stroke. Blood pressure should be checked at least every 1-2 years. Ongoing high blood pressure should be treated with medicines if weight loss and exercise are not effective.  If you are 79-65 years old, ask your health care provider if you should take aspirin to prevent heart disease.  Diabetes screening involves taking a blood sample to check your fasting blood sugar level. This should be done once every 3 years after age 33 if you are at a normal weight and without risk factors for diabetes. Testing should be considered at a younger age or be carried out more frequently if you are overweight and have at least 1 risk factor for diabetes.  Colorectal cancer can  be detected and often prevented. Most routine colorectal cancer screening begins at the age of 60 and continues through age 74. However, your health care provider may recommend screening at an earlier age if you have risk factors for colon cancer. On a yearly basis, your health care provider may provide home test kits to check for hidden blood in the stool. A small camera at the end of a tube may be used to directly examine the colon (sigmoidoscopy or colonoscopy) to detect the earliest forms of colorectal cancer. Talk to your health care provider about this at age 81 when routine screening begins. A direct exam of the colon should be repeated every 5-10 years through age 29, unless early forms of precancerous polyps or small growths are found.  People who are at an increased risk for  hepatitis B should be screened for this virus. You are considered at high risk for hepatitis B if:  You were born in a country where hepatitis B occurs often. Talk with your health care provider about which countries are considered high risk.  Your parents were born in a high-risk country and you have not received a shot to protect against hepatitis B (hepatitis B vaccine).  You have HIV or AIDS.  You use needles to inject street drugs.  You live with, or have sex with, someone who has hepatitis B.  You are a man who has sex with other men (MSM).  You get hemodialysis treatment.  You take certain medicines for conditions like cancer, organ transplantation, and autoimmune conditions.  Hepatitis C blood testing is recommended for all people born from [birth year] through 1963 and any individual with known risk factors for hepatitis C.  Healthy men should no longer receive prostate-specific antigen (PSA) blood tests as part of routine cancer screening. Talk to your health care provider about prostate cancer screening.  Testicular cancer screening is not recommended for adolescents or adult males who have no symptoms.  Screening includes self-exam, a health care provider exam, and other screening tests. Consult with your health care provider about any symptoms you have or any concerns you have about testicular cancer.  Practice safe sex. Use condoms and avoid high-risk sexual practices to reduce the spread of sexually transmitted infections (STIs).  You should be screened for STIs, including gonorrhea and chlamydia if:  You are sexually active and are younger than 24 years.  You are older than 24 years, and your health care provider tells you that you are at risk for this type of infection.  Your sexual activity has changed since you were last screened, and you are at an increased risk for chlamydia or gonorrhea. Ask your health care provider if you are at risk.  If you are at risk of being infected with HIV, it is recommended that you take a prescription medicine daily to prevent HIV infection. This is called pre-exposure prophylaxis (PrEP). You are considered at risk if:  You are a man who has sex with other men (MSM).  You are a heterosexual man who is sexually active with multiple partners.  You take drugs by injection.  You are sexually active with a partner who has HIV.  Talk with your health care provider about whether you are at high risk of being infected with HIV. If you choose to begin PrEP, you should first be tested for HIV. You should then be tested every 3 months for as long as you are taking PrEP.  Use sunscreen. Apply sunscreen liberally and repeatedly throughout the day. You should seek shade when your shadow is shorter than you. Protect yourself by wearing long sleeves, pants, a wide-brimmed hat, and sunglasses year round whenever you are outdoors.  Tell your health care provider of new moles or changes in moles, especially if there is a change in shape or color. Also, tell your health care provider if a mole is larger than the size of a pencil eraser.  A one-time screening for  abdominal aortic aneurysm (AAA) and surgical repair of large AAAs by ultrasound is recommended for men aged 85-75 years who are current or former smokers.  Stay current with your vaccines (immunizations). Document Released: 04/19/2008 Document Revised: 10/27/2013 Document Reviewed: 03/19/2011 [facility] Patient Information 2015 [place], [place]. This information is not intended to replace advice given to you by your health  care provider. Make sure you discuss any questions you have with your health care provider.  Hearing Loss A hearing loss is sometimes called deafness. Hearing loss may be partial or total. CAUSES Hearing loss may be caused by:  Wax in the ear canal.  Infection of the ear canal.  Infection of the middle ear.  Trauma to the ear or surrounding area.  Fluid in the middle ear.  A hole in the eardrum (perforated eardrum).  Exposure to loud sounds or music.  Problems with the hearing nerve.  Certain medications. Hearing loss without wax, infection, or a history of injury may mean that the nerve is involved. Hearing loss with severe dizziness, nausea and vomiting or ringing in the ear may suggest a hearing nerve irritation or problems in the middle or inner ear. If hearing loss is untreated, there is a greater likelihood for residual or permanent hearing loss. DIAGNOSIS A hearing test (audiometry) assesses hearing loss. The audiometry test needs to be performed by a hearing specialist (audiologist). TREATMENT Treatment for recent onset of hearing loss may include:  Ear wax removal.  Medications that kill germs (antibiotics).  Cortisone medications.  Prompt follow up with the appropriate specialist. Return of hearing depends on the cause of your hearing loss, so proper medical follow-up is important. Some hearing loss may not be reversible, and a caregiver should discuss care and treatment options with you. SEEK MEDICAL CARE IF:   You have a severe headache,  dizziness, or changes in vision.  You have new or increased weakness.  You develop repeated vomiting or other serious medical problems.  You have a fever. Document Released: 10/22/2005 Document Revised: 01/14/2012 Document Reviewed: 02/16/2010 [facility] Patient Information 2015 [place], [place]. This information is not intended to replace advice given to you by your health care provider. Make sure you discuss any questions you have with your health care provider.

## 2015-06-09 ENCOUNTER — Ambulatory Visit (INDEPENDENT_AMBULATORY_CARE_PROVIDER_SITE_OTHER): Payer: Medicare Other | Admitting: Internal Medicine

## 2015-06-09 ENCOUNTER — Encounter: Payer: Self-pay | Admitting: Internal Medicine

## 2015-06-09 ENCOUNTER — Other Ambulatory Visit (INDEPENDENT_AMBULATORY_CARE_PROVIDER_SITE_OTHER): Payer: Medicare Other

## 2015-06-09 VITALS — BP 120/74 | HR 56 | Temp 97.7°F | Ht 69.0 in | Wt 170.0 lb

## 2015-06-09 DIAGNOSIS — Z0000 Encounter for general adult medical examination without abnormal findings: Secondary | ICD-10-CM

## 2015-06-09 DIAGNOSIS — Z0189 Encounter for other specified special examinations: Secondary | ICD-10-CM

## 2015-06-09 DIAGNOSIS — E785 Hyperlipidemia, unspecified: Secondary | ICD-10-CM

## 2015-06-09 DIAGNOSIS — I10 Essential (primary) hypertension: Secondary | ICD-10-CM

## 2015-06-09 DIAGNOSIS — N183 Chronic kidney disease, stage 3 (moderate): Secondary | ICD-10-CM

## 2015-06-09 LAB — URINALYSIS, ROUTINE W REFLEX MICROSCOPIC
Bilirubin Urine: NEGATIVE
KETONES UR: NEGATIVE
Nitrite: POSITIVE — AB
SPECIFIC GRAVITY, URINE: 1.02 (ref 1.000–1.030)
Total Protein, Urine: NEGATIVE
Urine Glucose: NEGATIVE
Urobilinogen, UA: 0.2 (ref 0.0–1.0)
pH: 5.5 (ref 5.0–8.0)

## 2015-06-09 LAB — CBC WITH DIFFERENTIAL/PLATELET
BASOS ABS: 0 10*3/uL (ref 0.0–0.1)
Basophils Relative: 0.8 % (ref 0.0–3.0)
EOS PCT: 3.3 % (ref 0.0–5.0)
Eosinophils Absolute: 0.1 10*3/uL (ref 0.0–0.7)
HCT: 38.2 % — ABNORMAL LOW (ref 39.0–52.0)
HEMOGLOBIN: 12.9 g/dL — AB (ref 13.0–17.0)
LYMPHS PCT: 34.3 % (ref 12.0–46.0)
Lymphs Abs: 1.2 10*3/uL (ref 0.7–4.0)
MCHC: 33.8 g/dL (ref 30.0–36.0)
MCV: 92.1 fl (ref 78.0–100.0)
MONO ABS: 0.5 10*3/uL (ref 0.1–1.0)
MONOS PCT: 14.8 % — AB (ref 3.0–12.0)
NEUTROS ABS: 1.7 10*3/uL (ref 1.4–7.7)
NEUTROS PCT: 46.8 % (ref 43.0–77.0)
Platelets: 105 10*3/uL — ABNORMAL LOW (ref 150.0–400.0)
RBC: 4.15 Mil/uL — ABNORMAL LOW (ref 4.22–5.81)
RDW: 15.3 % (ref 11.5–15.5)
WBC: 3.6 10*3/uL — ABNORMAL LOW (ref 4.0–10.5)

## 2015-06-09 LAB — HEPATIC FUNCTION PANEL
ALK PHOS: 46 U/L (ref 39–117)
ALT: 7 U/L (ref 0–53)
AST: 14 U/L (ref 0–37)
Albumin: 3.9 g/dL (ref 3.5–5.2)
BILIRUBIN TOTAL: 0.4 mg/dL (ref 0.2–1.2)
Bilirubin, Direct: 0.1 mg/dL (ref 0.0–0.3)
Total Protein: 7.1 g/dL (ref 6.0–8.3)

## 2015-06-09 LAB — BASIC METABOLIC PANEL
BUN: 26 mg/dL — ABNORMAL HIGH (ref 6–23)
CALCIUM: 9.1 mg/dL (ref 8.4–10.5)
CO2: 26 meq/L (ref 19–32)
CREATININE: 1.76 mg/dL — AB (ref 0.40–1.50)
Chloride: 107 mEq/L (ref 96–112)
GFR: 47.55 mL/min — AB (ref >60.00)
Glucose, Bld: 89 mg/dL (ref 70–99)
Potassium: 4.1 mEq/L (ref 3.5–5.1)
Sodium: 139 mEq/L (ref 135–145)

## 2015-06-09 LAB — LIPID PANEL
Cholesterol: 146 mg/dL (ref 0–200)
HDL: 60.4 mg/dL (ref >39.00)
LDL Cholesterol: 70 mg/dL (ref 0–99)
NonHDL: 85.16
Total CHOL/HDL Ratio: 2
Triglycerides: 75 mg/dL (ref 0.0–149.0)
VLDL: 15 mg/dL (ref 0.0–40.0)

## 2015-06-09 LAB — TSH: TSH: 2.86 u[IU]/mL (ref 0.35–4.50)

## 2015-06-09 NOTE — Progress Notes (Signed)
Pre visit review using our clinic review tool, if applicable. No additional management support is needed unless otherwise documented below in the visit note. 

## 2015-06-09 NOTE — Assessment & Plan Note (Signed)
stable overall by history and exam, recent data reviewed with pt, and pt to continue medical treatment as before,  to f/u any worsening symptoms or concerns Lab Results  Component Value Date   LDLCALC 79 06/09/2014   For f/u lab and next visit

## 2015-06-09 NOTE — Assessment & Plan Note (Signed)
stable overall by history and exam, recent data reviewed with pt, and pt to continue medical treatment as before,  to f/u any worsening symptoms or concerns BP Readings from Last 3 Encounters:  06/09/15 120/74  05/23/15 110/60  12/10/14 130/81

## 2015-06-09 NOTE — Patient Instructions (Signed)
Please continue all other medications as before, and refills have been done if requested.  Please have the pharmacy call with any other refills you may need.  Please continue your efforts at being more active, low cholesterol diet, and weight control.  You are otherwise up to date with prevention measures today.  Please keep your appointments with your specialists as you may have planned  Please return in 6 months, or sooner if needed 

## 2015-06-09 NOTE — Assessment & Plan Note (Signed)
stable overall by history and exam, recent data reviewed with pt, and pt to continue medical treatment as before,  to f/u any worsening symptoms or concerns Lab Results  Component Value Date   CREATININE 1.78* 12/10/2014

## 2015-06-09 NOTE — Assessment & Plan Note (Signed)

## 2015-06-09 NOTE — Progress Notes (Signed)
Subjective:    Patient ID: Devin Hogan, male    DOB: 1928/04/30, 81 y.o.   MRN: [id number]  HPI    Here for wellness and f/u;  Overall doing ok;  Pt denies Chest pain, worsening SOB, DOE, wheezing, orthopnea, PND, worsening LE edema, palpitations, dizziness or syncope.  Pt denies neurological change such as new headache, facial or extremity weakness.  Pt denies polydipsia, polyuria, or low sugar symptoms. Pt states overall good compliance with treatment and medications, good tolerability, and has been trying to follow appropriate diet.  Pt denies worsening depressive symptoms, suicidal ideation or panic. No fever, night sweats, wt loss, loss of appetite, or other constitutional symptoms.  Pt states good ability with ADL's, has low fall risk, home safety reviewed and adequate, no other significant changes in hearing or vision, and only occasionally active with exercise. No current complaints Past Medical History  Diagnosis Date  . HYPERLIPIDEMIA 05/22/2007  . ERECTILE DYSFUNCTION 05/22/2007  . HYPERTENSION 05/22/2007  . ALLERGIC RHINITIS 05/22/2007  . RENAL INSUFFICIENCY 11/18/2009  . BENIGN PROSTATIC HYPERTROPHY 05/22/2007  . COLONIC POLYPS, HX OF 11/19/2007  . SKIN LESION 11/16/2010  . Acute gouty arthropathy 12/13/2010  . Gout 05/17/2011   No past surgical history on file.  reports that he has quit smoking. He does not have any smokeless tobacco history on file. He reports that he drinks alcohol. He reports that he does not use illicit drugs. family history is not on file. No Known Allergies Current Outpatient Prescriptions on File Prior to Visit  Medication Sig Dispense Refill  . aspirin 81 MG EC tablet Take 81 mg by mouth daily.      [name] diltiazem (CARDIZEM CD) 240 MG 24 hr capsule TAKE 1 CAPSULE DAILY 90 capsule 3  . hydrochlorothiazide (MICROZIDE) 12.5 MG capsule TAKE 1 CAPSULE BY MOUTH DAILY 90 capsule 3  . lovastatin (MEVACOR) 20 MG tablet TAKE 1 TABLET BY MOUTH EVERY DAY 90 tablet 3  .  traMADol (ULTRAM) 50 MG tablet Take 1 tablet (50 mg total) by mouth every 6 (six) hours as needed for pain. (Patient not taking: Reported on 06/09/2015) 60 tablet 0  . [DISCONTINUED] diltiazem (DILACOR XR) 240 MG 24 hr capsule Take 1 capsule (240 mg total) by mouth daily. 30 capsule 11   No current facility-administered medications on file prior to visit.    Review of Systems Constitutional: Negative for increased diaphoresis, other activity, appetite or siginficant weight change other than noted HENT: Negative for worsening hearing loss, ear pain, facial swelling, mouth sores and neck stiffness.   Eyes: Negative for other worsening pain, redness or visual disturbance.  Respiratory: Negative for shortness of breath and wheezing  Cardiovascular: Negative for chest pain and palpitations.  Gastrointestinal: Negative for diarrhea, blood in stool, abdominal distention or other pain Genitourinary: Negative for hematuria, flank pain or change in urine volume.  Musculoskeletal: Negative for myalgias or other joint complaints.  Skin: Negative for color change and wound or drainage.  Neurological: Negative for syncope and numbness. other than noted Hematological: Negative for adenopathy. or other swelling Psychiatric/Behavioral: Negative for hallucinations, SI, self-injury, decreased concentration or other worsening agitation.      Objective:   Physical Exam BP 120/74 mmHg  Pulse 56  Temp(Src) 97.7 F (36.5 C) (Oral)  Ht 5\' 9"  (1.753 m)  Wt 170 lb (77.111 kg)  BMI 25.09 kg/m2  SpO2 97% VS noted,  Constitutional: Pt is oriented to person, place, and time. Appears well-developed and well-nourished, in  no significant distress Head: Normocephalic and atraumatic.  Right Ear: External ear normal.  Left Ear: External ear normal.  Nose: Nose normal.  Mouth/Throat: Oropharynx is clear and moist.  Eyes: Conjunctivae and EOM are normal. Pupils are equal, round, and reactive to light.  Neck: Normal  range of motion. Neck supple. No JVD present. No tracheal deviation present or significant neck LA or mass Cardiovascular: Normal rate, regular rhythm, normal heart sounds and intact distal pulses.   Pulmonary/Chest: Effort normal and breath sounds without rales or wheezing  Abdominal: Soft. Bowel sounds are normal. NT. No HSM  Musculoskeletal: Normal range of motion. Exhibits no edema.  Lymphadenopathy:  Has no cervical adenopathy.  Neurological: Pt is alert and oriented to person, place, and time. Pt has normal reflexes. No cranial nerve deficit. Motor grossly intact Skin: Skin is warm and dry. No rash noted.  Psychiatric:  Has normal mood and affect. Behavior is normal.     Assessment & Plan:

## 2015-08-24 ENCOUNTER — Encounter: Payer: Self-pay | Admitting: Family

## 2015-08-24 ENCOUNTER — Other Ambulatory Visit (INDEPENDENT_AMBULATORY_CARE_PROVIDER_SITE_OTHER): Payer: Medicare Other

## 2015-08-24 ENCOUNTER — Ambulatory Visit (INDEPENDENT_AMBULATORY_CARE_PROVIDER_SITE_OTHER): Payer: Medicare Other | Admitting: Family

## 2015-08-24 ENCOUNTER — Telehealth: Payer: Self-pay | Admitting: Family

## 2015-08-24 VITALS — BP 144/78 | HR 57 | Temp 97.9°F | Resp 18 | Ht 69.0 in | Wt 168.0 lb

## 2015-08-24 DIAGNOSIS — M10072 Idiopathic gout, left ankle and foot: Secondary | ICD-10-CM

## 2015-08-24 DIAGNOSIS — M109 Gout, unspecified: Secondary | ICD-10-CM

## 2015-08-24 LAB — URIC ACID: URIC ACID, SERUM: 8.4 mg/dL — AB (ref 4.0–7.8)

## 2015-08-24 MED ORDER — COLCHICINE 0.6 MG PO TABS: ORAL_TABLET | ORAL | Status: DC

## 2015-08-24 MED ORDER — INDOMETHACIN 25 MG PO CAPS: 25.0000 mg | ORAL_CAPSULE | Freq: Three times a day (TID) | ORAL | Status: DC | PRN

## 2015-08-24 NOTE — Progress Notes (Signed)
Subjective:    Patient ID: Devin Hogan, male    DOB: [birth year], 81 y.o.   MRN: [id number]  Chief Complaint  Patient presents with  . Gout    states he has a gout flare up in his left foot, x1 week    HPI:  Devin Hogan is a 81 y.o. male who  has a past medical history of HYPERLIPIDEMIA (05/22/2007); ERECTILE DYSFUNCTION (05/22/2007); HYPERTENSION (05/22/2007); ALLERGIC RHINITIS (05/22/2007); RENAL INSUFFICIENCY (11/18/2009); BENIGN PROSTATIC HYPERTROPHY (05/22/2007); COLONIC POLYPS, HX OF (11/19/2007); SKIN LESION (11/16/2010); Acute gouty arthropathy (12/13/2010); and Gout (05/17/2011). and presents today for an acute office visit.   Associated symptom of pain located in his left foot has been going on for about 1 week. Denies pain and notes swelling. Modifying factors include a rub that did not help very much. Course of the symptoms has stayed about the same over the week. Denies trauma to the foot or sounds/sensations heard or felt. Previous gout attacks have been in the same left great toe.   No Known Allergies   Current Outpatient Prescriptions on File Prior to Visit  Medication Sig Dispense Refill  . aspirin 81 MG EC tablet Take 81 mg by mouth daily.      [name] diltiazem (CARDIZEM CD) 240 MG 24 hr capsule TAKE 1 CAPSULE DAILY 90 capsule 3  . hydrochlorothiazide (MICROZIDE) 12.5 MG capsule TAKE 1 CAPSULE BY MOUTH DAILY 90 capsule 3  . lovastatin (MEVACOR) 20 MG tablet TAKE 1 TABLET BY MOUTH EVERY DAY 90 tablet 3  . traMADol (ULTRAM) 50 MG tablet Take 1 tablet (50 mg total) by mouth every 6 (six) hours as needed for pain. 60 tablet 0  . [DISCONTINUED] diltiazem (DILACOR XR) 240 MG 24 hr capsule Take 1 capsule (240 mg total) by mouth daily. 30 capsule 11   No current facility-administered medications on file prior to visit.    CrCl cannot be calculated (Patient has no serum creatinine result on file.).  No past surgical history on file.    Review of Systems  Constitutional: Negative  for fever and chills.  Musculoskeletal:       Positive for left great toe pain.  Neurological: Negative for weakness and numbness.      Objective:    BP 144/78 mmHg  Pulse 57  Temp(Src) 97.9 F (36.6 C) (Oral)  Resp 18  Ht 5\' 9"  (1.753 m)  Wt 168 lb (76.204 kg)  BMI 24.80 kg/m2  SpO2 96% Nursing note and vital signs reviewed.  Physical Exam  Constitutional: He is oriented to person, place, and time. He appears well-developed and well-nourished. No distress.  Cardiovascular: Normal rate, regular rhythm, normal heart sounds and intact distal pulses.   Pulmonary/Chest: Effort normal and breath sounds normal.  Musculoskeletal:  Left great toe - no obvious deformity noted. Mild redness and tender to touch. Range of motion is slightly limited secondary to pain. Capillary refill intact and appropriate.  Neurological: He is alert and oriented to person, place, and time.  Skin: Skin is warm and dry.  Psychiatric: He has a normal mood and affect. His behavior is normal. Judgment and thought content normal.       Assessment & Plan:   Problem List Items Addressed This Visit      Other   Gout - Primary    Symptoms and exam consistent with gout. Start indomethacin. Start reduced dose colchicine secondary to diltiazem use. Obtain uric acid level. Follow up if symptoms worsen or fail to  improve.      Relevant Medications   indomethacin (INDOCIN) 25 MG capsule   colchicine 0.6 MG tablet   Other Relevant Orders   Uric acid

## 2015-08-24 NOTE — Progress Notes (Signed)
Pre visit review using our clinic review tool, if applicable. No additional management support is needed unless otherwise documented below in the visit note. 

## 2015-08-24 NOTE — Telephone Encounter (Signed)
Please inform patient that his uric acid levels were elevated which are consistent with gout.

## 2015-08-24 NOTE — Assessment & Plan Note (Signed)
Symptoms and exam consistent with gout. Start indomethacin. Start reduced dose colchicine secondary to diltiazem use. Obtain uric acid level. Follow up if symptoms worsen or fail to improve.

## 2015-08-24 NOTE — Patient Instructions (Addendum)
Thank you for choosing [facility].  Summary/Instructions:  Your prescription(s) have been submitted to your pharmacy or been printed and provided for you. Please take as directed and contact our office if you believe you are having problem(s) with the medication(s) or have any questions.  If your symptoms worsen or fail to improve, please contact our office for further instruction, or in case of emergency go directly to the emergency room at the closest medical facility.   Gout Gout is an inflammatory arthritis caused by a buildup of uric acid crystals in the joints. Uric acid is a chemical that is normally present in the blood. When the level of uric acid in the blood is too high it can form crystals that deposit in your joints and tissues. This causes joint redness, soreness, and swelling (inflammation). Repeat attacks are common. Over time, uric acid crystals can form into masses (tophi) near a joint, destroying bone and causing disfigurement. Gout is treatable and often preventable. CAUSES  The disease begins with elevated levels of uric acid in the blood. Uric acid is produced by your body when it breaks down a naturally found substance called purines. Certain foods you eat, such as meats and fish, contain high amounts of purines. Causes of an elevated uric acid level include:  Being passed down from parent to child (heredity).  Diseases that cause increased uric acid production (such as obesity, psoriasis, and certain cancers).  Excessive alcohol use.  Diet, especially diets rich in meat and seafood.  Medicines, including certain cancer-fighting medicines (chemotherapy), water pills (diuretics), and aspirin.  Chronic kidney disease. The kidneys are no longer able to remove uric acid well.  Problems with metabolism. Conditions strongly associated with gout include:  Obesity.  High blood pressure.  High cholesterol.  Diabetes. Not everyone with elevated uric acid levels  gets gout. It is not understood why some people get gout and others do not. Surgery, joint injury, and eating too much of certain foods are some of the factors that can lead to gout attacks. SYMPTOMS   An attack of gout comes on quickly. It causes intense pain with redness, swelling, and warmth in a joint.  Fever can occur.  Often, only one joint is involved. Certain joints are more commonly involved:  Base of the big toe.  Knee.  Ankle.  Wrist.  Finger. Without treatment, an attack usually goes away in a few days to weeks. Between attacks, you usually will not have symptoms, which is different from many other forms of arthritis. DIAGNOSIS  Your caregiver will suspect gout based on your symptoms and exam. In some cases, tests may be recommended. The tests may include:  Blood tests.  Urine tests.  X-rays.  Joint fluid exam. This exam requires a needle to remove fluid from the joint (arthrocentesis). Using a microscope, gout is confirmed when uric acid crystals are seen in the joint fluid. TREATMENT  There are two phases to gout treatment: treating the sudden onset (acute) attack and preventing attacks (prophylaxis).  Treatment of an Acute Attack.  Medicines are used. These include anti-inflammatory medicines or steroid medicines.  An injection of steroid medicine into the affected joint is sometimes necessary.  The painful joint is rested. Movement can worsen the arthritis.  You may use warm or cold treatments on painful joints, depending which works best for you.  Treatment to Prevent Attacks.  If you suffer from frequent gout attacks, your caregiver may advise preventive medicine. These medicines are started after the acute  attack subsides. These medicines either help your kidneys eliminate uric acid from your body or decrease your uric acid production. You may need to stay on these medicines for a very long time.  The early phase of treatment with preventive medicine  can be associated with an increase in acute gout attacks. For this reason, during the first few months of treatment, your caregiver may also advise you to take medicines usually used for acute gout treatment. Be sure you understand your caregiver's directions. Your caregiver may make several adjustments to your medicine dose before these medicines are effective.  Discuss dietary treatment with your caregiver or dietitian. Alcohol and drinks high in sugar and fructose and foods such as meat, poultry, and seafood can increase uric acid levels. Your caregiver or dietitian can advise you on drinks and foods that should be limited. HOME CARE INSTRUCTIONS   Do not take aspirin to relieve pain. This raises uric acid levels.  Only take over-the-counter or prescription medicines for pain, discomfort, or fever as directed by your caregiver.  Rest the joint as much as possible. When in bed, keep sheets and blankets off painful areas.  Keep the affected joint raised (elevated).  Apply warm or cold treatments to painful joints. Use of warm or cold treatments depends on which works best for you.  Use crutches if the painful joint is in your leg.  Drink enough fluids to keep your urine clear or pale yellow. This helps your body get rid of uric acid. Limit alcohol, sugary drinks, and fructose drinks.  Follow your dietary instructions. Pay careful attention to the amount of protein you eat. Your daily diet should emphasize fruits, vegetables, whole grains, and fat-free or low-fat milk products. Discuss the use of coffee, vitamin C, and cherries with your caregiver or dietitian. These may be helpful in lowering uric acid levels.  Maintain a healthy body weight. SEEK MEDICAL CARE IF:   You develop diarrhea, vomiting, or any side effects from medicines.  You do not feel better in 24 hours, or you are getting worse. SEEK IMMEDIATE MEDICAL CARE IF:   Your joint becomes suddenly more tender, and you have  chills or a fever. MAKE SURE YOU:   Understand these instructions.  Will watch your condition.  Will get help right away if you are not doing well or get worse.   This information is not intended to replace advice given to you by your health care provider. Make sure you discuss any questions you have with your health care provider.   Document Released: 10/19/2000 Document Revised: 11/12/2014 Document Reviewed: 06/04/2012 Elsevier Interactive Patient Education [employer].

## 2015-08-25 NOTE — Telephone Encounter (Signed)
LVM for pt to call back.

## 2015-09-02 NOTE — Telephone Encounter (Signed)
Results sent in the mail. 

## 2015-12-07 ENCOUNTER — Other Ambulatory Visit (INDEPENDENT_AMBULATORY_CARE_PROVIDER_SITE_OTHER): Payer: Medicare Other

## 2015-12-07 DIAGNOSIS — N183 Chronic kidney disease, stage 3 (moderate): Secondary | ICD-10-CM | POA: Diagnosis not present

## 2015-12-07 LAB — LIPID PANEL
CHOLESTEROL: 139 mg/dL (ref 0–200)
HDL: 60.2 mg/dL (ref >39.00)
LDL Cholesterol: 67 mg/dL (ref 0–99)
NonHDL: 78.95
TRIGLYCERIDES: 61 mg/dL (ref 0.0–149.0)
Total CHOL/HDL Ratio: 2
VLDL: 12.2 mg/dL (ref 0.0–40.0)

## 2015-12-07 LAB — HEPATIC FUNCTION PANEL
ALBUMIN: 3.8 g/dL (ref 3.5–5.2)
ALT: 6 U/L (ref 0–53)
AST: 13 U/L (ref 0–37)
Alkaline Phosphatase: 47 U/L (ref 39–117)
BILIRUBIN TOTAL: 0.5 mg/dL (ref 0.2–1.2)
Bilirubin, Direct: 0.1 mg/dL (ref 0.0–0.3)
TOTAL PROTEIN: 7.3 g/dL (ref 6.0–8.3)

## 2015-12-07 LAB — BASIC METABOLIC PANEL
BUN: 23 mg/dL (ref 6–23)
CALCIUM: 8.9 mg/dL (ref 8.4–10.5)
CO2: 24 meq/L (ref 19–32)
Chloride: 108 mEq/L (ref 96–112)
Creatinine, Ser: 1.63 mg/dL — ABNORMAL HIGH (ref 0.40–1.50)
GFR: 51.89 mL/min — ABNORMAL LOW (ref >60.00)
GLUCOSE: 92 mg/dL (ref 70–99)
POTASSIUM: 4.3 meq/L (ref 3.5–5.1)
Sodium: 141 mEq/L (ref 135–145)

## 2015-12-09 ENCOUNTER — Ambulatory Visit (INDEPENDENT_AMBULATORY_CARE_PROVIDER_SITE_OTHER): Payer: Medicare Other | Admitting: Internal Medicine

## 2015-12-09 ENCOUNTER — Encounter: Payer: Self-pay | Admitting: Internal Medicine

## 2015-12-09 VITALS — BP 122/70 | HR 58 | Temp 98.3°F | Resp 20 | Ht 69.0 in | Wt 170.0 lb

## 2015-12-09 DIAGNOSIS — I10 Essential (primary) hypertension: Secondary | ICD-10-CM | POA: Diagnosis not present

## 2015-12-09 DIAGNOSIS — Z515 Encounter for palliative care: Secondary | ICD-10-CM | POA: Insufficient documentation

## 2015-12-09 DIAGNOSIS — N183 Chronic kidney disease, stage 3 (moderate): Secondary | ICD-10-CM | POA: Diagnosis not present

## 2015-12-09 DIAGNOSIS — Z0001 Encounter for general adult medical examination with abnormal findings: Secondary | ICD-10-CM

## 2015-12-09 DIAGNOSIS — E785 Hyperlipidemia, unspecified: Secondary | ICD-10-CM | POA: Diagnosis not present

## 2015-12-09 DIAGNOSIS — Z0000 Encounter for general adult medical examination without abnormal findings: Secondary | ICD-10-CM | POA: Insufficient documentation

## 2015-12-09 NOTE — Progress Notes (Signed)
Pre visit review using our clinic review tool, if applicable. No additional management support is needed unless otherwise documented below in the visit note. 

## 2015-12-09 NOTE — Progress Notes (Signed)
Subjective:    Patient ID: Devin Hogan, male    DOB: 1928-06-09, 82 y.o.   MRN: [id number]  HPI  Here to f/u; overall doing ok,  Pt denies chest pain, increasing sob or doe, wheezing, orthopnea, PND, increased LE swelling, palpitations, dizziness or syncope.  Pt denies new neurological symptoms such as new headache, or facial or extremity weakness or numbness.  Pt denies polydipsia, polyuria, or low sugar episode.   Pt denies new neurological symptoms such as new headache, or facial or extremity weakness or numbness.   Pt states overall good compliance with meds, mostly trying to follow appropriate diet, with wt overall stable,  but little exercise however. No joint pain or effusions Past Medical History  Diagnosis Date  . HYPERLIPIDEMIA 05/22/2007  . ERECTILE DYSFUNCTION 05/22/2007  . HYPERTENSION 05/22/2007  . ALLERGIC RHINITIS 05/22/2007  . RENAL INSUFFICIENCY 11/18/2009  . BENIGN PROSTATIC HYPERTROPHY 05/22/2007  . COLONIC POLYPS, HX OF 11/19/2007  . SKIN LESION 11/16/2010  . Acute gouty arthropathy 12/13/2010  . Gout 05/17/2011   No past surgical history on file.  reports that he has quit smoking. He does not have any smokeless tobacco history on file. He reports that he drinks alcohol. He reports that he does not use illicit drugs. family history is not on file. No Known Allergies Current Outpatient Prescriptions on File Prior to Visit  Medication Sig Dispense Refill  . aspirin 81 MG EC tablet Take 81 mg by mouth daily.      . colchicine 0.6 MG tablet Take 1 tablet by mouth at the onset of flair (0.6 mg) then 1/2 tablet (0.3 mg) 1 hour later - do not repeat for 3 days. 2 tablet 0  . diltiazem (CARDIZEM CD) 240 MG 24 hr capsule TAKE 1 CAPSULE DAILY 90 capsule 3  . hydrochlorothiazide (MICROZIDE) 12.5 MG capsule TAKE 1 CAPSULE BY MOUTH DAILY 90 capsule 3  . indomethacin (INDOCIN) 25 MG capsule Take 1 capsule (25 mg total) by mouth 3 (three) times daily as needed. 30 capsule 0  . lovastatin  (MEVACOR) 20 MG tablet TAKE 1 TABLET BY MOUTH EVERY DAY 90 tablet 3  . [DISCONTINUED] diltiazem (DILACOR XR) 240 MG 24 hr capsule Take 1 capsule (240 mg total) by mouth daily. 30 capsule 11   No current facility-administered medications on file prior to visit.    Review of Systems  Constitutional: Negative for unusual diaphoresis or night sweats HENT: Negative for ringing in ear or discharge Eyes: Negative for double vision or worsening visual disturbance.  Respiratory: Negative for choking and stridor.   Gastrointestinal: Negative for vomiting or other signifcant bowel change Genitourinary: Negative for hematuria or change in urine volume.  Musculoskeletal: Negative for other MSK pain or swelling Skin: Negative for color change and worsening wound.  Neurological: Negative for tremors and numbness other than noted  Psychiatric/Behavioral: Negative for decreased concentration or agitation other than above       Objective:   Physical Exam BP 122/70 mmHg  Pulse 58  Temp(Src) 98.3 F (36.8 C) (Oral)  Resp 20  Ht 5\' 9"  (1.753 m)  Wt 170 lb (77.111 kg)  BMI 25.09 kg/m2  SpO2 98% VS noted,  Constitutional: Pt appears in no significant distress HENT: Head: NCAT.  Right Ear: External ear normal.  Left Ear: External ear normal.  Eyes: . Pupils are equal, round, and reactive to light. Conjunctivae and EOM are normal Neck: Normal range of motion. Neck supple.  Cardiovascular: Normal rate and  regular rhythm.   Pulmonary/Chest: Effort normal and breath sounds without rales or wheezing.  Neurological: Pt is alert. Not confused , motor grossly intact Skin: Skin is warm. No rash, no LE edema Psychiatric: Pt behavior is normal. No agitation.     Assessment & Plan:

## 2015-12-09 NOTE — Patient Instructions (Signed)
Please continue all other medications as before, and refills have been done if requested.  Please have the pharmacy call with any other refills you may need.  Please continue your efforts at being more active, low cholesterol diet, and weight control.  You are otherwise up to date with prevention measures today.  Please keep your appointments with your specialists as you may have planned  Please return in 6 months, or sooner if needed, with Lab testing done 3-5 days before  

## 2015-12-10 NOTE — Assessment & Plan Note (Signed)
stable overall by history and exam, recent data reviewed with pt, and pt to continue medical treatment as before,  to f/u any worsening symptoms or concerns BP Readings from Last 3 Encounters:  12/09/15 122/70  08/24/15 144/78  06/09/15 120/74

## 2015-12-10 NOTE — Assessment & Plan Note (Signed)
stable overall by history and exam, recent data reviewed with pt, and pt to continue medical treatment as before,  to f/u any worsening symptoms or concerns Lab Results  Component Value Date   LDLCALC 67 12/07/2015

## 2015-12-10 NOTE — Assessment & Plan Note (Signed)
stable overall by history and exam, recent data reviewed with pt, and pt to continue medical treatment as before,  to f/u any worsening symptoms or concerns Lab Results  Component Value Date   CREATININE 1.63* 12/07/2015

## 2016-02-08 ENCOUNTER — Other Ambulatory Visit: Payer: Self-pay | Admitting: Internal Medicine

## 2016-05-22 ENCOUNTER — Telehealth: Payer: Self-pay

## 2016-05-22 NOTE — Telephone Encounter (Signed)
Fup regarding AWV; Wife stated Mr. Watring will stay after his apt with Dr. Jenny Reichmann on 8/3 at 8:15; AWV at 8:30

## 2016-05-28 DIAGNOSIS — H40033 Anatomical narrow angle, bilateral: Secondary | ICD-10-CM | POA: Diagnosis not present

## 2016-05-28 DIAGNOSIS — H43393 Other vitreous opacities, bilateral: Secondary | ICD-10-CM | POA: Diagnosis not present

## 2016-06-05 ENCOUNTER — Other Ambulatory Visit (INDEPENDENT_AMBULATORY_CARE_PROVIDER_SITE_OTHER): Payer: Medicare Other

## 2016-06-05 ENCOUNTER — Encounter: Payer: Self-pay | Admitting: Internal Medicine

## 2016-06-05 DIAGNOSIS — R6889 Other general symptoms and signs: Secondary | ICD-10-CM | POA: Diagnosis not present

## 2016-06-05 DIAGNOSIS — Z0001 Encounter for general adult medical examination with abnormal findings: Secondary | ICD-10-CM

## 2016-06-05 LAB — URINALYSIS, ROUTINE W REFLEX MICROSCOPIC
Bilirubin Urine: NEGATIVE
Ketones, ur: NEGATIVE
Nitrite: POSITIVE — AB
PH: 5.5 (ref 5.0–8.0)
SPECIFIC GRAVITY, URINE: 1.02 (ref 1.000–1.030)
TOTAL PROTEIN, URINE-UPE24: NEGATIVE
URINE GLUCOSE: NEGATIVE
UROBILINOGEN UA: 0.2 (ref 0.0–1.0)

## 2016-06-05 LAB — BASIC METABOLIC PANEL
BUN: 26 mg/dL — ABNORMAL HIGH (ref 6–23)
CHLORIDE: 110 meq/L (ref 96–112)
CO2: 24 meq/L (ref 19–32)
Calcium: 8.9 mg/dL (ref 8.4–10.5)
Creatinine, Ser: 1.92 mg/dL — ABNORMAL HIGH (ref 0.40–1.50)
GFR: 42.9 mL/min — ABNORMAL LOW (ref >60.00)
GLUCOSE: 88 mg/dL (ref 70–99)
POTASSIUM: 4.1 meq/L (ref 3.5–5.1)
SODIUM: 142 meq/L (ref 135–145)

## 2016-06-05 LAB — LIPID PANEL
CHOLESTEROL: 141 mg/dL (ref 0–200)
HDL: 58.1 mg/dL (ref >39.00)
LDL Cholesterol: 72 mg/dL (ref 0–99)
NonHDL: 82.91
Total CHOL/HDL Ratio: 2
Triglycerides: 54 mg/dL (ref 0.0–149.0)
VLDL: 10.8 mg/dL (ref 0.0–40.0)

## 2016-06-05 LAB — HEPATIC FUNCTION PANEL
ALBUMIN: 3.8 g/dL (ref 3.5–5.2)
ALK PHOS: 47 U/L (ref 39–117)
ALT: 5 U/L (ref 0–53)
AST: 12 U/L (ref 0–37)
Bilirubin, Direct: 0.1 mg/dL (ref 0.0–0.3)
TOTAL PROTEIN: 7.3 g/dL (ref 6.0–8.3)
Total Bilirubin: 0.5 mg/dL (ref 0.2–1.2)

## 2016-06-05 LAB — CBC WITH DIFFERENTIAL/PLATELET
BASOS PCT: 0.6 % (ref 0.0–3.0)
Basophils Absolute: 0 10*3/uL (ref 0.0–0.1)
EOS ABS: 0.3 10*3/uL (ref 0.0–0.7)
EOS PCT: 7.2 % — AB (ref 0.0–5.0)
HCT: 36.6 % — ABNORMAL LOW (ref 39.0–52.0)
HEMOGLOBIN: 12.2 g/dL — AB (ref 13.0–17.0)
LYMPHS ABS: 1.4 10*3/uL (ref 0.7–4.0)
Lymphocytes Relative: 39.3 % (ref 12.0–46.0)
MCHC: 33.4 g/dL (ref 30.0–36.0)
MCV: 90.3 fl (ref 78.0–100.0)
MONO ABS: 0.5 10*3/uL (ref 0.1–1.0)
Monocytes Relative: 12.4 % — ABNORMAL HIGH (ref 3.0–12.0)
NEUTROS PCT: 40.5 % — AB (ref 43.0–77.0)
Neutro Abs: 1.5 10*3/uL (ref 1.4–7.7)
Platelets: 102 10*3/uL — ABNORMAL LOW (ref 150.0–400.0)
RBC: 4.05 Mil/uL — ABNORMAL LOW (ref 4.22–5.81)
RDW: 15.3 % (ref 11.5–15.5)
WBC: 3.7 10*3/uL — AB (ref 4.0–10.5)

## 2016-06-05 LAB — TSH: TSH: 3.53 u[IU]/mL (ref 0.35–4.50)

## 2016-06-07 ENCOUNTER — Ambulatory Visit (INDEPENDENT_AMBULATORY_CARE_PROVIDER_SITE_OTHER): Payer: Medicare Other | Admitting: Internal Medicine

## 2016-06-07 ENCOUNTER — Encounter: Payer: Self-pay | Admitting: Internal Medicine

## 2016-06-07 VITALS — BP 118/70 | HR 53 | Temp 98.0°F | Ht 69.0 in | Wt 167.0 lb

## 2016-06-07 DIAGNOSIS — I10 Essential (primary) hypertension: Secondary | ICD-10-CM | POA: Diagnosis not present

## 2016-06-07 DIAGNOSIS — N390 Urinary tract infection, site not specified: Secondary | ICD-10-CM

## 2016-06-07 DIAGNOSIS — Z0000 Encounter for general adult medical examination without abnormal findings: Secondary | ICD-10-CM | POA: Diagnosis not present

## 2016-06-07 DIAGNOSIS — N183 Chronic kidney disease, stage 3 (moderate): Secondary | ICD-10-CM | POA: Diagnosis not present

## 2016-06-07 DIAGNOSIS — D696 Thrombocytopenia, unspecified: Secondary | ICD-10-CM | POA: Diagnosis not present

## 2016-06-07 DIAGNOSIS — R6889 Other general symptoms and signs: Secondary | ICD-10-CM | POA: Diagnosis not present

## 2016-06-07 DIAGNOSIS — Z0001 Encounter for general adult medical examination with abnormal findings: Secondary | ICD-10-CM | POA: Diagnosis not present

## 2016-06-07 MED ORDER — CEPHALEXIN 500 MG PO CAPS: 500.0000 mg | ORAL_CAPSULE | Freq: Four times a day (QID) | ORAL | 0 refills | Status: AC

## 2016-06-07 NOTE — Progress Notes (Signed)
Subjective:    Patient ID: Devin Hogan, male    DOB: [birth year], 82 y.o.   MRN: [id number]  HPI  Here for wellness and f/u;  Remarkably health for age, Overall doing ok;  Pt denies Chest pain, worsening SOB, DOE, wheezing, orthopnea, PND, worsening LE edema, palpitations, dizziness or syncope.  Pt denies neurological change such as new headache, facial or extremity weakness.  Pt denies polydipsia, polyuria, or low sugar symptoms. Pt states overall good compliance with treatment and medications, good tolerability, and has been trying to follow appropriate diet.  Pt denies worsening depressive symptoms, suicidal ideation or panic. No fever, night sweats, wt loss, loss of appetite, or other constitutional symptoms.  Pt states good ability with ADL's, has low fall risk, home safety reviewed and adequate, no other significant changes in hearing or vision, and only occasionally active with exercise. Has one tooth left (left upper) but no wt loss. Wt Readings from Last 3 Encounters:  06/07/16 167 lb (75.8 kg)  12/09/15 170 lb (77.1 kg)  08/24/15 168 lb (76.2 kg)   Denies urinary symptoms such as dysuria, frequency, urgency, flank pain, hematuria or n/v, fever, chills.  Has no unusual bleeding or bruising,  Has trace pedal edema chronic no change for the past yr.  Past Medical History:  Diagnosis Date  . Acute gouty arthropathy 12/13/2010  . ALLERGIC RHINITIS 05/22/2007  . BENIGN PROSTATIC HYPERTROPHY 05/22/2007  . COLONIC POLYPS, HX OF 11/19/2007  . ERECTILE DYSFUNCTION 05/22/2007  . Gout 05/17/2011  . HYPERLIPIDEMIA 05/22/2007  . HYPERTENSION 05/22/2007  . RENAL INSUFFICIENCY 11/18/2009  . SKIN LESION 11/16/2010   No past surgical history on file.  reports that he has quit smoking. He does not have any smokeless tobacco history on file. He reports that he drinks alcohol. He reports that he does not use drugs. family history is not on file. No Known Allergies Current Outpatient Prescriptions on File  Prior to Visit  Medication Sig Dispense Refill  . aspirin 81 MG EC tablet Take 81 mg by mouth daily.      . colchicine 0.6 MG tablet Take 1 tablet by mouth at the onset of flair (0.6 mg) then 1/2 tablet (0.3 mg) 1 hour later - do not repeat for 3 days. 2 tablet 0  . diltiazem (CARDIZEM CD) 240 MG 24 hr capsule TAKE 1 CAPSULE DAILY 90 capsule 3  . hydrochlorothiazide (MICROZIDE) 12.5 MG capsule TAKE ONE CAPSULE BY MOUTH ONCE DAILY 90 capsule 3  . indomethacin (INDOCIN) 25 MG capsule Take 1 capsule (25 mg total) by mouth 3 (three) times daily as needed. 30 capsule 0  . lovastatin (MEVACOR) 20 MG tablet TAKE 1 TABLET BY MOUTH EVERY DAY 90 tablet 3  . [DISCONTINUED] diltiazem (DILACOR XR) 240 MG 24 hr capsule Take 1 capsule (240 mg total) by mouth daily. 30 capsule 11   No current facility-administered medications on file prior to visit.    Review of Systems Constitutional: Negative for increased diaphoresis, or other activity, appetite or siginficant weight change other than noted HENT: Negative for worsening hearing loss, ear pain, facial swelling, mouth sores and neck stiffness.   Eyes: Negative for other worsening pain, redness or visual disturbance.  Respiratory: Negative for choking or stridor Cardiovascular: Negative for other chest pain and palpitations.  Gastrointestinal: Negative for worsening diarrhea, blood in stool, or abdominal distention Genitourinary: Negative for hematuria, flank pain or change in urine volume.  Musculoskeletal: Negative for myalgias or other joint complaints.  Skin:  Negative for other color change and wound or drainage.  Neurological: Negative for syncope and numbness. other than noted Hematological: Negative for adenopathy. or other swelling Psychiatric/Behavioral: Negative for hallucinations, SI, self-injury, decreased concentration or other worsening agitation.      Objective:   Physical Exam BP 118/70   Pulse (!) 53   Temp 98 F (36.7 C)   Wt 167 lb  (75.8 kg)   SpO2 98%   BMI 24.66 kg/m  VS noted,  Constitutional: Pt is oriented to person, place, and time. Appears well-developed and well-nourished, in no significant distress Head: Normocephalic and atraumatic  Eyes: Conjunctivae and EOM are normal. Pupils are equal, round, and reactive to light Right Ear: External ear normal.  Left Ear: External ear normal Nose: Nose normal.  Mouth/Throat: Oropharynx is clear and moist  Neck: Normal range of motion. Neck supple. No JVD present. No tracheal deviation present or significant neck LA or mass Cardiovascular: Normal rate, regular rhythm, normal heart sounds and intact distal pulses.   Pulmonary/Chest: Effort normal and breath sounds without rales or wheezing  Abdominal: Soft. Bowel sounds are normal. NT. No HSM  Musculoskeletal: Normal range of motion. Exhibits trace pedal edema Lymphadenopathy: Has no cervical adenopathy.  Neurological: Pt is alert and oriented to person, place, and time. Pt has normal reflexes. No cranial nerve deficit. Motor grossly intact Skin: Skin is warm and dry. No rash noted or new ulcers., no bruising Psychiatric:  Has normal mood and affect. Behavior is normal.     Assessment & Plan:

## 2016-06-07 NOTE — Patient Instructions (Addendum)
Please take all new medication as prescribed - the antibiotic  Please continue all other medications as before, and refills have been done if requested.  Please have the pharmacy call with any other refills you may need.  Please continue your efforts at being more active, low cholesterol diet, and weight control.  You are otherwise up to date with prevention measures today.  Please keep your appointments with your specialists as you may have planned  Please return in 6 months, or sooner if needed, with Lab testing done 3-5 days before  Devin Hogan will see you now for your yearly Annual Wellness Visit   Devin Hogan , Thank you for taking time to come for your Medicare Wellness Visit. I appreciate your ongoing commitment to your health goals. Please review the following plan we discussed and let me know if I can assist you in the future.   These are the goals we discussed: Goals    . patient          Does yard; keep walking in the yard;      . wants to maintain health (pt-stated)          Does walk and work everyday in the yard;        This is a list of the screening recommended for you and due dates:  Health Maintenance  Topic Date Due  . Flu Shot  06/05/2016  . Tetanus Vaccine  11/18/2018  . Shingles Vaccine  Completed  . Pneumonia vaccines  Completed      Fall Prevention in the Home  Falls can cause injuries. They can happen to people of all ages. There are many things you can do to make your home safe and to help prevent falls.  WHAT CAN I DO ON THE OUTSIDE OF MY HOME?  Regularly fix the edges of walkways and driveways and fix any cracks.  Remove anything that might make you trip as you walk through a door, such as a raised step or threshold.  Trim any bushes or trees on the path to your home.  Use bright outdoor lighting.  Clear any walking paths of anything that might make someone trip, such as rocks or tools.  Regularly check to see if handrails are loose or  broken. Make sure that both sides of any steps have handrails.  Any raised decks and porches should have guardrails on the edges.  Have any leaves, snow, or ice cleared regularly.  Use sand or salt on walking paths during winter.  Clean up any spills in your garage right away. This includes oil or grease spills. WHAT CAN I DO IN THE BATHROOM?   Use night lights.  Install grab bars by the toilet and in the tub and shower. Do not use towel bars as grab bars.  Use non-skid mats or decals in the tub or shower.  If you need to sit down in the shower, use a plastic, non-slip stool.  Keep the floor dry. Clean up any water that spills on the floor as soon as it happens.  Remove soap buildup in the tub or shower regularly.  Attach bath mats securely with double-sided non-slip rug tape.  Do not have throw rugs and other things on the floor that can make you trip. WHAT CAN I DO IN THE BEDROOM?  Use night lights.  Make sure that you have a light by your bed that is easy to reach.  Do not use any sheets or blankets that are  too big for your bed. They should not hang down onto the floor.  Have a firm chair that has side arms. You can use this for support while you get dressed.  Do not have throw rugs and other things on the floor that can make you trip. WHAT CAN I DO IN THE KITCHEN?  Clean up any spills right away.  Avoid walking on wet floors.  Keep items that you use a lot in easy-to-reach places.  If you need to reach something above you, use a strong step stool that has a grab bar.  Keep electrical cords out of the way.  Do not use floor polish or wax that makes floors slippery. If you must use wax, use non-skid floor wax.  Do not have throw rugs and other things on the floor that can make you trip. WHAT CAN I DO WITH MY STAIRS?  Do not leave any items on the stairs.  Make sure that there are handrails on both sides of the stairs and use them. Fix handrails that are  broken or loose. Make sure that handrails are as long as the stairways.  Check any carpeting to make sure that it is firmly attached to the stairs. Fix any carpet that is loose or worn.  Avoid having throw rugs at the top or bottom of the stairs. If you do have throw rugs, attach them to the floor with carpet tape.  Make sure that you have a light switch at the top of the stairs and the bottom of the stairs. If you do not have them, ask someone to add them for you. WHAT ELSE CAN I DO TO HELP PREVENT FALLS?  Wear shoes that:  Do not have high heels.  Have rubber bottoms.  Are comfortable and fit you well.  Are closed at the toe. Do not wear sandals.  If you use a stepladder:  Make sure that it is fully opened. Do not climb a closed stepladder.  Make sure that both sides of the stepladder are locked into place.  Ask someone to hold it for you, if possible.  Clearly mark and make sure that you can see:  Any grab bars or handrails.  First and last steps.  Where the edge of each step is.  Use tools that help you move around (mobility aids) if they are needed. These include:  Canes.  Walkers.  Scooters.  Crutches.  Turn on the lights when you go into a dark area. Replace any light bulbs as soon as they burn out.  Set up your furniture so you have a clear path. Avoid moving your furniture around.  If any of your floors are uneven, fix them.  If there are any pets around you, be aware of where they are.  Review your medicines with your doctor. Some medicines can make you feel dizzy. This can increase your chance of falling. Ask your doctor what other things that you can do to help prevent falls.   This information is not intended to replace advice given to you by your health care provider. Make sure you discuss any questions you have with your health care provider.   Document Released: 08/18/2009 Document Revised: 03/08/2015 Document Reviewed: 11/26/2014 Elsevier  Interactive Patient Education 2016 [place] Maintenance, Male A healthy lifestyle and preventative care can promote health and wellness.  Maintain regular health, dental, and eye exams.  Eat a healthy diet. Foods like vegetables, fruits, whole grains, low-fat dairy products, and  lean protein foods contain the nutrients you need and are low in calories. Decrease your intake of foods high in solid fats, added sugars, and salt. Get information about a proper diet from your health care provider, if necessary.  Regular physical exercise is one of the most important things you can do for your health. Most adults should get at least 150 minutes of moderate-intensity exercise (any activity that increases your heart rate and causes you to sweat) each week. In addition, most adults need muscle-strengthening exercises on 2 or more days a week.   Maintain a healthy weight. The body mass index (BMI) is a screening tool to identify possible weight problems. It provides an estimate of body fat based on height and weight. Your health care provider can find your BMI and can help you achieve or maintain a healthy weight. For males 20 years and older:  A BMI below 18.5 is considered underweight.  A BMI of 18.5 to 24.9 is normal.  A BMI of 25 to 29.9 is considered overweight.  A BMI of 30 and above is considered obese.  Maintain normal blood lipids and cholesterol by exercising and minimizing your intake of saturated fat. Eat a balanced diet with plenty of fruits and vegetables. Blood tests for lipids and cholesterol should begin at age 34 and be repeated every 5 years. If your lipid or cholesterol levels are high, you are over age 77, or you are at high risk for heart disease, you may need your cholesterol levels checked more frequently.Ongoing high lipid and cholesterol levels should be treated with medicines if diet and exercise are not working.  If you smoke, find out from your health care  provider how to quit. If you do not use tobacco, do not start.  Lung cancer screening is recommended for adults aged 51-80 years who are at high risk for developing lung cancer because of a history of smoking. A yearly low-dose CT scan of the lungs is recommended for people who have at least a 30-pack-year history of smoking and are current smokers or have quit within the past 15 years. A pack year of smoking is smoking an average of 1 pack of cigarettes a day for 1 year (for example, a 30-pack-year history of smoking could mean smoking 1 pack a day for 30 years or 2 packs a day for 15 years). Yearly screening should continue until the smoker has stopped smoking for at least 15 years. Yearly screening should be stopped for people who develop a health problem that would prevent them from having lung cancer treatment.  If you choose to drink alcohol, do not have more than 2 drinks per day. One drink is considered to be 12 oz (360 mL) of beer, 5 oz (150 mL) of wine, or 1.5 oz (45 mL) of liquor.  Avoid the use of street drugs. Do not share needles with anyone. Ask for help if you need support or instructions about stopping the use of drugs.  High blood pressure causes heart disease and increases the risk of stroke. High blood pressure is more likely to develop in:  People who have blood pressure in the end of the normal range (100-139/85-89 mm Hg).  People who are overweight or obese.  People who are African American.  If you are 11-74 years of age, have your blood pressure checked every 3-5 years. If you are 71 years of age or older, have your blood pressure checked every year. You should have your blood pressure  measured twice--once when you are at a hospital or clinic, and once when you are not at a hospital or clinic. Record the average of the two measurements. To check your blood pressure when you are not at a hospital or clinic, you can use:  An automated blood pressure machine at a  pharmacy.  A home blood pressure monitor.  If you are 27-45 years old, ask your health care provider if you should take aspirin to prevent heart disease.  Diabetes screening involves taking a blood sample to check your fasting blood sugar level. This should be done once every 3 years after age 47 if you are at a normal weight and without risk factors for diabetes. Testing should be considered at a younger age or be carried out more frequently if you are overweight and have at least 1 risk factor for diabetes.  Colorectal cancer can be detected and often prevented. Most routine colorectal cancer screening begins at the age of 14 and continues through age 55. However, your health care provider may recommend screening at an earlier age if you have risk factors for colon cancer. On a yearly basis, your health care provider may provide home test kits to check for hidden blood in the stool. A small camera at the end of a tube may be used to directly examine the colon (sigmoidoscopy or colonoscopy) to detect the earliest forms of colorectal cancer. Talk to your health care provider about this at age 70 when routine screening begins. A direct exam of the colon should be repeated every 5-10 years through age 39, unless early forms of precancerous polyps or small growths are found.  People who are at an increased risk for hepatitis B should be screened for this virus. You are considered at high risk for hepatitis B if:  You were born in a country where hepatitis B occurs often. Talk with your health care provider about which countries are considered high risk.  Your parents were born in a high-risk country and you have not received a shot to protect against hepatitis B (hepatitis B vaccine).  You have HIV or AIDS.  You use needles to inject street drugs.  You live with, or have sex with, someone who has hepatitis B.  You are a man who has sex with other men (MSM).  You get hemodialysis  treatment.  You take certain medicines for conditions like cancer, organ transplantation, and autoimmune conditions.  Hepatitis C blood testing is recommended for all people born from [birth year] through 1963 and any individual with known risk factors for hepatitis C.  Healthy men should no longer receive prostate-specific antigen (PSA) blood tests as part of routine cancer screening. Talk to your health care provider about prostate cancer screening.  Testicular cancer screening is not recommended for adolescents or adult males who have no symptoms. Screening includes self-exam, a health care provider exam, and other screening tests. Consult with your health care provider about any symptoms you have or any concerns you have about testicular cancer.  Practice safe sex. Use condoms and avoid high-risk sexual practices to reduce the spread of sexually transmitted infections (STIs).  You should be screened for STIs, including gonorrhea and chlamydia if:  You are sexually active and are younger than 24 years.  You are older than 24 years, and your health care provider tells you that you are at risk for this type of infection.  Your sexual activity has changed since you were last screened, and you are at  an increased risk for chlamydia or gonorrhea. Ask your health care provider if you are at risk.  If you are at risk of being infected with HIV, it is recommended that you take a prescription medicine daily to prevent HIV infection. This is called pre-exposure prophylaxis (PrEP). You are considered at risk if:  You are a man who has sex with other men (MSM).  You are a heterosexual man who is sexually active with multiple partners.  You take drugs by injection.  You are sexually active with a partner who has HIV.  Talk with your health care provider about whether you are at high risk of being infected with HIV. If you choose to begin PrEP, you should first be tested for HIV. You should then be tested  every 3 months for as long as you are taking PrEP.  Use sunscreen. Apply sunscreen liberally and repeatedly throughout the day. You should seek shade when your shadow is shorter than you. Protect yourself by wearing long sleeves, pants, a wide-brimmed hat, and sunglasses year round whenever you are outdoors.  Tell your health care provider of new moles or changes in moles, especially if there is a change in shape or color. Also, tell your health care provider if a mole is larger than the size of a pencil eraser.  A one-time screening for abdominal aortic aneurysm (AAA) and surgical repair of large AAAs by ultrasound is recommended for men aged 43-75 years who are current or former smokers.  Stay current with your vaccines (immunizations).   This information is not intended to replace advice given to you by your health care provider. Make sure you discuss any questions you have with your health care provider.   Document Released: 04/19/2008 Document Revised: 11/12/2014 Document Reviewed: 03/19/2011 Elsevier Interactive Patient Education [employer].

## 2016-06-07 NOTE — Assessment & Plan Note (Signed)
stable overall by history and exam, recent data reviewed with pt, and pt to continue medical treatment as before,  to f/u any worsening symptoms or concerns BP Readings from Last 3 Encounters:  06/07/16 118/70  12/09/15 122/70  08/24/15 (!) 144/78

## 2016-06-07 NOTE — Assessment & Plan Note (Signed)
Chronic mild stable, declines further evlaution

## 2016-06-07 NOTE — Progress Notes (Signed)
Pre visit review using our clinic review tool, if applicable. No additional management support is needed unless otherwise documented below in the visit note. 

## 2016-06-07 NOTE — Assessment & Plan Note (Addendum)
Subclinical but with worsening pyuria this yr,, for cephalexin course asd,  to f/u any worsening symptoms or concerns  In addition to the time spent performing CPE, I spent an additional 25 minutes face to face,in which greater than 50% of this time was spent in counseling and coordination of care for patient's acute illness as documented.

## 2016-06-07 NOTE — Assessment & Plan Note (Signed)
Slight worsening this yr, ? Related to UTI, for antibx, consider renal referral, declines for now

## 2016-06-07 NOTE — Assessment & Plan Note (Signed)

## 2016-06-07 NOTE — Progress Notes (Signed)
Subjective:   Devin Hogan is a 82 y.o. male who presents for Medicare Annual/Subsequent preventive examination.  HRA assessment completed during this visit with Devin Hogan  The Patient was informed that the wellness visit is to identify future health risk and educate and initiate measures that can reduce risk for increased disease through the lifespan.    NO ROS; Medicare Wellness Visit Devin Hogan is 82yo  Last OV:  Today  Labs completed: chol 141; trig 54; LDL 72; HDL 58 (Glucose 88)  HTN; well managed  Former smoker/ Quit years ago when young   Psychosocial: Spouse lives at home; 90 years;  5 children; lives in town; 12 grandchildren  Medications reviewed for issues; compliance; otc meds  BMI: 24   Diet;   Eat what he wants Breakfast; cereal;  Lunch; wife still cooks  Sometimes we mix it up Generally states he eats well; has a good appetite  Teeth or Denture issues? Has to get dentures; but declines any assistance at this time  Exercise;   Mows the yard; has spouse that is 38;  She is in a w/c or walker;  One level home; take showers in tub  Has a seat in tub but doesn't use it  Agreed to continue to walk   HOME SAFETY not planning on moving  Issues reviewed that may potentiate risk include multilevel or inaccessible homes or long term plan? Plans to age in place  Personal safety issues reviewed for risk such as safe community; smoke detector; firearms safety if applicable; protection when in the sun; driving safety for seniors or any recent accidents. No accidents  Fall hx; no hx UA or BOWEL incontinence; none Functional losses from last year to this year? Not really  Given education on "Fall Prevention in the Home" for more safety tips the patient can apply as appropriate.   Risk for Depression reviewed: Any emotional problems? Anxious, depressed, irritable, sad or blue? No  Denies feeling depressed or hopeless; voices pleasure in daily life How many social  activities have you been engaged in within the last 2 weeks? no  Sleep well   Cognitive; no problem with daily living Completed MMSE; Score was 19/29; influenced by level of education; 7th grade Could not name the county; state; some difficulty with pronunciation but did recall 2/3 words with sentence given to assist in cuing Declined serial 3's from 20 or to spell world backwards; Does manage his finances at home without difficulty;  Reading may be an issue/ low literacy;  Declined to write a sentence Missed diagram.  Conversation lack details but overall, no failures at independent living MMSE score most likely inaccurately low secondary to low literacy.   Advanced Directive addressed; has completed but wife does that  Oldest child Ladora Maintenance Gaps:  Colonoscopy; 06/2005; aged out  EKG: 05/2012 Prostate cancer screening: neg 2011  Hearing: 2000hz  both ears  Ophthalmology exam; had eyes checked x 2 weeks [employer] / eyes were good    Immunizations Due: (Vaccines reviewed and educated regarding any overdue)  Does not take flu shot  Established and updated Risk reviewed and appropriate referral made or health recommendations: Cardiac Risk Factors include: advanced age (>70men, >24 women);dyslipidemia;male gender;sedentary lifestyle     Objective:    Vitals: BP 118/70   Pulse (!) 53   Temp 98 F (36.7 C)   Ht 5\' 9"  (1.753 m)   Wt 167 lb (75.8 kg)   SpO2 98%  BMI 24.66 kg/m   Body mass index is 24.66 kg/m.  Tobacco History  Smoking Status  . Former Smoker  Smokeless Tobacco  . Not on file    Comment: quit when he was very yourg     Counseling given: Yes   Past Medical History:  Diagnosis Date  . Acute gouty arthropathy 12/13/2010  . ALLERGIC RHINITIS 05/22/2007  . BENIGN PROSTATIC HYPERTROPHY 05/22/2007  . COLONIC POLYPS, HX OF 11/19/2007  . ERECTILE DYSFUNCTION 05/22/2007  . Gout 05/17/2011  . HYPERLIPIDEMIA 05/22/2007  .  HYPERTENSION 05/22/2007  . RENAL INSUFFICIENCY 11/18/2009  . SKIN LESION 11/16/2010   No past surgical history on file. No family history on file. History  Sexual Activity  . Sexual activity: Not on file    Outpatient Encounter Prescriptions as of 06/07/2016  Medication Sig  . aspirin 81 MG EC tablet Take 81 mg by mouth daily.    . colchicine 0.6 MG tablet Take 1 tablet by mouth at the onset of flair (0.6 mg) then 1/2 tablet (0.3 mg) 1 hour later - do not repeat for 3 days.  [name] diltiazem (CARDIZEM CD) 240 MG 24 hr capsule TAKE 1 CAPSULE DAILY  . hydrochlorothiazide (MICROZIDE) 12.5 MG capsule TAKE ONE CAPSULE BY MOUTH ONCE DAILY  . indomethacin (INDOCIN) 25 MG capsule Take 1 capsule (25 mg total) by mouth 3 (three) times daily as needed.  . lovastatin (MEVACOR) 20 MG tablet TAKE 1 TABLET BY MOUTH EVERY DAY  . cephALEXin (KEFLEX) 500 MG capsule Take 1 capsule (500 mg total) by mouth 4 (four) times daily.   No facility-administered encounter medications on file as of 06/07/2016.     Activities of Daily Living In your present state of health, do you have any difficulty performing the following activities: 06/07/2016  Hearing? N  Vision? N  Difficulty concentrating or making decisions? N  Walking or climbing stairs? N  Dressing or bathing? N  Doing errands, shopping? N  Preparing Food and eating ? N  Using the Toilet? N  In the past six months, have you accidently leaked urine? N  Do you have problems with loss of bowel control? N  Managing your Medications? N  Managing your Finances? N  Housekeeping or managing your Housekeeping? N  Some recent data might be hidden    Patient Care Team: Biagio Borg, MD as PCP - General   Assessment:     Exercise Activities and Dietary recommendations Current Exercise Habits: Home exercise routine, Type of exercise: walking, Time (Minutes): 30, Frequency (Times/Week): 5, Weekly Exercise (Minutes/Week): 150, Intensity: Mild  Goals    . patient            Does yard; keep walking in the yard;      . wants to maintain health (pt-stated)          Does walk and work everyday in the yard;       Fall Risk Fall Risk  06/07/2016 05/23/2015 06/09/2014 12/04/2013 06/03/2013  Falls in the past year? No No No No No   Depression Screen PHQ 2/9 Scores 06/07/2016 05/23/2015 06/09/2014 12/04/2013  PHQ - 2 Score 0 0 0 0    Cognitive Testing MMSE - Mini Mental State Exam 06/07/2016 05/23/2015  Not completed: (No Data) Unable to complete  Orientation to time 5 -  Orientation to Place 2 -  Registration 3 -  Attention/ Calculation 0 -  Recall 2 -  Language- name 2 objects 2 -  Language- repeat 1 -  Language- follow 3 step command 3 -  Language- read & follow direction 1 -  Write a sentence 0 -  Copy design 0 -  Total score 19 -    Immunization History  Administered Date(s) Administered  . Pneumococcal Conjugate-13 12/04/2013  . Pneumococcal Polysaccharide-23 05/06/2006, 05/17/2011  . Td 11/05/1996, 11/18/2008  . Zoster 05/11/2006   Screening Tests Health Maintenance  Topic Date Due  . INFLUENZA VACCINE  06/05/2016  . TETANUS/TDAP  11/18/2018  . ZOSTAVAX  Completed  . PNA vac Low Risk Adult  Completed      Plan:      During the course of the visit the patient was educated and counseled about the following appropriate screening and preventive services:   Vaccines to include Pneumoccal, Influenza, Hepatitis B, Td, Zostavax, HCV  States he does not take the flu shot  Electrocardiogram  Cardiovascular Disease/ no issues; encouraged walking  Colorectal cancer screening/ aged out  Diabetes screening/ neg  Prostate Cancer Screening/ deferred  Glaucoma screening/ states eye examined at [facility] and no issues   Nutrition counseling / to maintain his weight  Smoking cessation counseling/ reviewed; quit many years ago   Patient Instructions (the written plan) was given to the patient.    [id number], RN  06/07/2016  Medical  screening examination/treatment/procedure(s) were performed by non-physician practitioner and as supervising physician I was immediately available for consultation/collaboration. I agree with above. Cathlean Cower, MD

## 2016-06-26 ENCOUNTER — Other Ambulatory Visit: Payer: Self-pay | Admitting: Internal Medicine

## 2016-08-28 ENCOUNTER — Ambulatory Visit: Payer: Medicare Other

## 2016-12-07 ENCOUNTER — Encounter: Payer: Self-pay | Admitting: Internal Medicine

## 2016-12-07 ENCOUNTER — Ambulatory Visit (INDEPENDENT_AMBULATORY_CARE_PROVIDER_SITE_OTHER): Payer: Medicare Other | Admitting: Internal Medicine

## 2016-12-07 ENCOUNTER — Other Ambulatory Visit (INDEPENDENT_AMBULATORY_CARE_PROVIDER_SITE_OTHER): Payer: Medicare Other

## 2016-12-07 ENCOUNTER — Other Ambulatory Visit: Payer: Self-pay | Admitting: Internal Medicine

## 2016-12-07 VITALS — BP 120/76 | HR 63 | Temp 98.1°F | Resp 20 | Wt 176.0 lb

## 2016-12-07 DIAGNOSIS — N179 Acute kidney failure, unspecified: Secondary | ICD-10-CM

## 2016-12-07 DIAGNOSIS — E785 Hyperlipidemia, unspecified: Secondary | ICD-10-CM

## 2016-12-07 DIAGNOSIS — N183 Chronic kidney disease, stage 3 unspecified: Secondary | ICD-10-CM

## 2016-12-07 DIAGNOSIS — Z0001 Encounter for general adult medical examination with abnormal findings: Secondary | ICD-10-CM

## 2016-12-07 DIAGNOSIS — I10 Essential (primary) hypertension: Secondary | ICD-10-CM | POA: Diagnosis not present

## 2016-12-07 DIAGNOSIS — N189 Chronic kidney disease, unspecified: Principal | ICD-10-CM

## 2016-12-07 DIAGNOSIS — R8281 Pyuria: Secondary | ICD-10-CM

## 2016-12-07 DIAGNOSIS — N390 Urinary tract infection, site not specified: Secondary | ICD-10-CM

## 2016-12-07 LAB — URINALYSIS, ROUTINE W REFLEX MICROSCOPIC
BILIRUBIN URINE: NEGATIVE
HGB URINE DIPSTICK: NEGATIVE
KETONES UR: NEGATIVE
LEUKOCYTES UA: NEGATIVE
NITRITE: POSITIVE — AB
RBC / HPF: NONE SEEN (ref 0–?)
Specific Gravity, Urine: 1.015 (ref 1.000–1.030)
Total Protein, Urine: NEGATIVE
Urine Glucose: NEGATIVE
Urobilinogen, UA: 0.2 (ref 0.0–1.0)
pH: 6 (ref 5.0–8.0)

## 2016-12-07 LAB — CBC WITH DIFFERENTIAL/PLATELET
BASOS ABS: 0 10*3/uL (ref 0.0–0.1)
Basophils Relative: 1.4 % (ref 0.0–3.0)
EOS ABS: 0.1 10*3/uL (ref 0.0–0.7)
Eosinophils Relative: 3.4 % (ref 0.0–5.0)
HEMATOCRIT: 37.8 % — AB (ref 39.0–52.0)
Hemoglobin: 12.7 g/dL — ABNORMAL LOW (ref 13.0–17.0)
LYMPHS ABS: 1.1 10*3/uL (ref 0.7–4.0)
LYMPHS PCT: 39 % (ref 12.0–46.0)
MCHC: 33.7 g/dL (ref 30.0–36.0)
MCV: 92.3 fl (ref 78.0–100.0)
MONOS PCT: 13.8 % — AB (ref 3.0–12.0)
Monocytes Absolute: 0.4 10*3/uL (ref 0.1–1.0)
NEUTROS PCT: 42.4 % — AB (ref 43.0–77.0)
Neutro Abs: 1.2 10*3/uL — ABNORMAL LOW (ref 1.4–7.7)
PLATELETS: 93 10*3/uL — AB (ref 150.0–400.0)
RBC: 4.1 Mil/uL — AB (ref 4.22–5.81)
RDW: 15.2 % (ref 11.5–15.5)
WBC: 2.8 10*3/uL — ABNORMAL LOW (ref 4.0–10.5)

## 2016-12-07 LAB — BASIC METABOLIC PANEL
BUN: 22 mg/dL (ref 6–23)
CALCIUM: 9 mg/dL (ref 8.4–10.5)
CO2: 23 mEq/L (ref 19–32)
Chloride: 109 mEq/L (ref 96–112)
Creatinine, Ser: 1.99 mg/dL — ABNORMAL HIGH (ref 0.40–1.50)
GFR: 41.12 mL/min — AB (ref >60.00)
Glucose, Bld: 92 mg/dL (ref 70–99)
Potassium: 4.6 mEq/L (ref 3.5–5.1)
SODIUM: 141 meq/L (ref 135–145)

## 2016-12-07 LAB — HEPATIC FUNCTION PANEL
ALK PHOS: 46 U/L (ref 39–117)
ALT: 6 U/L (ref 0–53)
AST: 13 U/L (ref 0–37)
Albumin: 4 g/dL (ref 3.5–5.2)
BILIRUBIN DIRECT: 0.1 mg/dL (ref 0.0–0.3)
BILIRUBIN TOTAL: 0.5 mg/dL (ref 0.2–1.2)
Total Protein: 7.3 g/dL (ref 6.0–8.3)

## 2016-12-07 LAB — LIPID PANEL
CHOL/HDL RATIO: 3
Cholesterol: 149 mg/dL (ref 0–200)
HDL: 57.5 mg/dL (ref >39.00)
LDL CALC: 75 mg/dL (ref 0–99)
NONHDL: 91.5
Triglycerides: 82 mg/dL (ref 0.0–149.0)
VLDL: 16.4 mg/dL (ref 0.0–40.0)

## 2016-12-07 NOTE — Assessment & Plan Note (Signed)
stable overall by history and exam, recent data reviewed with pt, and pt to continue medical treatment as before,  to f/u any worsening symptoms or concerns BP Readings from Last 3 Encounters:  12/07/16 120/76  06/07/16 118/70  12/09/15 122/70

## 2016-12-07 NOTE — Assessment & Plan Note (Signed)
Mild worsening recenlty, for f/u today, consider renal referral

## 2016-12-07 NOTE — Assessment & Plan Note (Signed)
stable overall by history and exam, recent data reviewed with pt, and pt to continue medical treatment as before,  to f/u any worsening symptoms or concerns Lab Results  Component Value Date   LDLCALC 72 06/05/2016

## 2016-12-07 NOTE — Patient Instructions (Signed)

## 2016-12-07 NOTE — Assessment & Plan Note (Addendum)
Also for UA today, asympt,  to f/u any worsening symptoms or concerns

## 2016-12-07 NOTE — Progress Notes (Signed)
Subjective:    Patient ID: Devin Hogan, male    DOB: Jan 30, 1928, 83 y.o.   MRN: [id number]  HPI   Here to f/u; overall doing ok,  Pt denies chest pain, increasing sob or doe, wheezing, orthopnea, PND, increased LE swelling, palpitations, dizziness or syncope.  Pt denies new neurological symptoms such as new headache, or facial or extremity weakness or numbness.  Pt denies polydipsia, polyuria, or low sugar episode.   Pt denies new neurological symptoms such as new headache, or facial or extremity weakness or numbness.   Pt states overall good compliance with meds, mostly trying to follow appropriate diet, with wt overall stable,  but little exercise however.  No other new hx Past Medical History:  Diagnosis Date  . Acute gouty arthropathy 12/13/2010  . ALLERGIC RHINITIS 05/22/2007  . BENIGN PROSTATIC HYPERTROPHY 05/22/2007  . COLONIC POLYPS, HX OF 11/19/2007  . ERECTILE DYSFUNCTION 05/22/2007  . Gout 05/17/2011  . HYPERLIPIDEMIA 05/22/2007  . HYPERTENSION 05/22/2007  . RENAL INSUFFICIENCY 11/18/2009  . SKIN LESION 11/16/2010   No past surgical history on file.  reports that he has quit smoking. He does not have any smokeless tobacco history on file. He reports that he drinks alcohol. He reports that he does not use drugs. family history is not on file. No Known Allergies Current Outpatient Prescriptions on File Prior to Visit  Medication Sig Dispense Refill  . aspirin 81 MG EC tablet Take 81 mg by mouth daily.      [name] CARTIA XT 240 MG 24 hr capsule TAKE ONE CAPSULE BY MOUTH ONCE DAILY 90 capsule 1  . colchicine 0.6 MG tablet Take 1 tablet by mouth at the onset of flair (0.6 mg) then 1/2 tablet (0.3 mg) 1 hour later - do not repeat for 3 days. 2 tablet 0  . hydrochlorothiazide (MICROZIDE) 12.5 MG capsule TAKE ONE CAPSULE BY MOUTH ONCE DAILY 90 capsule 3  . indomethacin (INDOCIN) 25 MG capsule Take 1 capsule (25 mg total) by mouth 3 (three) times daily as needed. 30 capsule 0  . lovastatin  (MEVACOR) 20 MG tablet TAKE ONE TABLET BY MOUTH ONCE DAILY 90 tablet 1  . [DISCONTINUED] diltiazem (DILACOR XR) 240 MG 24 hr capsule Take 1 capsule (240 mg total) by mouth daily. 30 capsule 11   No current facility-administered medications on file prior to visit.     Review of Systems  Constitutional: Negative for unusual diaphoresis or night sweats HENT: Negative for ear swelling or discharge Eyes: Negative for worsening visual haziness  Respiratory: Negative for choking and stridor.   Gastrointestinal: Negative for distension or worsening eructation Genitourinary: Negative for retention or change in urine volume.  Musculoskeletal: Negative for other MSK pain or swelling Skin: Negative for color change and worsening wound Neurological: Negative for tremors and numbness other than noted  Psychiatric/Behavioral: Negative for decreased concentration or agitation other than above   All other system neg per pt    Objective:   Physical Exam BP 120/76   Pulse 63   Temp 98.1 F (36.7 C) (Oral)   Resp 20   Wt 176 lb (79.8 kg)   SpO2 96%   BMI 25.99 kg/m  VS noted,  Constitutional: Pt appears in no apparent distress HENT: Head: NCAT.  Right Ear: External ear normal.  Left Ear: External ear normal.  Eyes: . Pupils are equal, round, and reactive to light. Conjunctivae and EOM are normal Neck: Normal range of motion. Neck supple.  Cardiovascular: Normal  rate and regular rhythm.   Pulmonary/Chest: Effort normal and breath sounds without rales or wheezing.  Neurological: Pt is alert. Not confused , motor grossly intact Skin: Skin is warm. No rash, no LE edema Psychiatric: Pt behavior is normal. No agitation.  No other new exam findings    Assessment & Plan:

## 2016-12-07 NOTE — Addendum Note (Signed)
Addended by: Biagio Borg on: 12/07/2016 06:53 PM   Modules accepted: Orders

## 2016-12-07 NOTE — Progress Notes (Signed)
Pre visit review using our clinic review tool, if applicable. No additional management support is needed unless otherwise documented below in the visit note. 

## 2016-12-10 LAB — URINE CULTURE

## 2016-12-13 ENCOUNTER — Ambulatory Visit
Admission: RE | Admit: 2016-12-13 | Discharge: 2016-12-13 | Disposition: A | Discharge status: Home / Self Care | Payer: Medicare Other | Source: Ambulatory Visit | Attending: Internal Medicine | Admitting: Internal Medicine

## 2016-12-13 DIAGNOSIS — R8281 Pyuria: Secondary | ICD-10-CM

## 2016-12-13 DIAGNOSIS — N189 Chronic kidney disease, unspecified: Secondary | ICD-10-CM | POA: Diagnosis not present

## 2016-12-13 DIAGNOSIS — N183 Chronic kidney disease, stage 3 unspecified: Secondary | ICD-10-CM

## 2016-12-22 ENCOUNTER — Encounter: Payer: Self-pay | Admitting: Internal Medicine

## 2016-12-24 ENCOUNTER — Telehealth: Payer: Self-pay | Admitting: *Deleted

## 2016-12-25 NOTE — Telephone Encounter (Signed)
A user error has taken place: open by mistake.../lmb  

## 2016-12-27 DIAGNOSIS — N390 Urinary tract infection, site not specified: Secondary | ICD-10-CM | POA: Diagnosis not present

## 2017-01-18 ENCOUNTER — Other Ambulatory Visit: Payer: Self-pay | Admitting: Internal Medicine

## 2017-02-05 DIAGNOSIS — E785 Hyperlipidemia, unspecified: Secondary | ICD-10-CM | POA: Diagnosis not present

## 2017-02-05 DIAGNOSIS — I129 Hypertensive chronic kidney disease with stage 1 through stage 4 chronic kidney disease, or unspecified chronic kidney disease: Secondary | ICD-10-CM | POA: Diagnosis not present

## 2017-02-05 DIAGNOSIS — N179 Acute kidney failure, unspecified: Secondary | ICD-10-CM | POA: Diagnosis not present

## 2017-02-05 DIAGNOSIS — N390 Urinary tract infection, site not specified: Secondary | ICD-10-CM | POA: Diagnosis not present

## 2017-03-25 ENCOUNTER — Emergency Department (HOSPITAL_COMMUNITY)
Admission: EM | Admit: 2017-03-25 | Discharge: 2017-03-25 | Disposition: A | Discharge status: Home / Self Care | Payer: Medicare Other | Attending: Emergency Medicine | Admitting: Emergency Medicine

## 2017-03-25 ENCOUNTER — Emergency Department (HOSPITAL_COMMUNITY): Payer: Medicare Other

## 2017-03-25 ENCOUNTER — Encounter (HOSPITAL_COMMUNITY): Payer: Self-pay

## 2017-03-25 DIAGNOSIS — E860 Dehydration: Secondary | ICD-10-CM | POA: Insufficient documentation

## 2017-03-25 DIAGNOSIS — I951 Orthostatic hypotension: Secondary | ICD-10-CM

## 2017-03-25 DIAGNOSIS — N189 Chronic kidney disease, unspecified: Secondary | ICD-10-CM | POA: Insufficient documentation

## 2017-03-25 DIAGNOSIS — Z87891 Personal history of nicotine dependence: Secondary | ICD-10-CM | POA: Insufficient documentation

## 2017-03-25 DIAGNOSIS — R55 Syncope and collapse: Secondary | ICD-10-CM | POA: Diagnosis not present

## 2017-03-25 DIAGNOSIS — Z7982 Long term (current) use of aspirin: Secondary | ICD-10-CM | POA: Insufficient documentation

## 2017-03-25 DIAGNOSIS — R42 Dizziness and giddiness: Secondary | ICD-10-CM | POA: Diagnosis not present

## 2017-03-25 DIAGNOSIS — I129 Hypertensive chronic kidney disease with stage 1 through stage 4 chronic kidney disease, or unspecified chronic kidney disease: Secondary | ICD-10-CM | POA: Diagnosis not present

## 2017-03-25 DIAGNOSIS — R404 Transient alteration of awareness: Secondary | ICD-10-CM | POA: Diagnosis not present

## 2017-03-25 DIAGNOSIS — R05 Cough: Secondary | ICD-10-CM | POA: Diagnosis not present

## 2017-03-25 LAB — CBC WITH DIFFERENTIAL/PLATELET
BASOS ABS: 0 10*3/uL (ref 0.0–0.1)
Basophils Relative: 1 %
EOS ABS: 0.1 10*3/uL (ref 0.0–0.7)
EOS PCT: 3 %
HCT: 38 % — ABNORMAL LOW (ref 39.0–52.0)
Hemoglobin: 12.3 g/dL — ABNORMAL LOW (ref 13.0–17.0)
LYMPHS ABS: 0.9 10*3/uL (ref 0.7–4.0)
Lymphocytes Relative: 30 %
MCH: 30.2 pg (ref 26.0–34.0)
MCHC: 32.4 g/dL (ref 30.0–36.0)
MCV: 93.4 fL (ref 78.0–100.0)
MONO ABS: 0.3 10*3/uL (ref 0.1–1.0)
Monocytes Relative: 12 %
NEUTROS PCT: 54 %
Neutro Abs: 1.6 10*3/uL — ABNORMAL LOW (ref 1.7–7.7)
PLATELETS: 91 10*3/uL — AB (ref 150–400)
RBC: 4.07 MIL/uL — ABNORMAL LOW (ref 4.22–5.81)
RDW: 14.9 % (ref 11.5–15.5)
WBC: 2.9 10*3/uL — AB (ref 4.0–10.5)

## 2017-03-25 LAB — COMPREHENSIVE METABOLIC PANEL
ALT: 8 U/L — AB (ref 17–63)
AST: 17 U/L (ref 15–41)
Albumin: 3.7 g/dL (ref 3.5–5.0)
Alkaline Phosphatase: 53 U/L (ref 38–126)
Anion gap: 8 (ref 5–15)
BUN: 27 mg/dL — ABNORMAL HIGH (ref 6–20)
CHLORIDE: 111 mmol/L (ref 101–111)
CO2: 21 mmol/L — AB (ref 22–32)
CREATININE: 1.85 mg/dL — AB (ref 0.61–1.24)
Calcium: 8.8 mg/dL — ABNORMAL LOW (ref 8.9–10.3)
GFR, EST AFRICAN AMERICAN: 36 mL/min — AB (ref >60)
GFR, EST NON AFRICAN AMERICAN: 31 mL/min — AB (ref >60)
Glucose, Bld: 96 mg/dL (ref 65–99)
POTASSIUM: 4.4 mmol/L (ref 3.5–5.1)
SODIUM: 140 mmol/L (ref 135–145)
Total Bilirubin: 0.5 mg/dL (ref 0.3–1.2)
Total Protein: 6.8 g/dL (ref 6.5–8.1)

## 2017-03-25 LAB — I-STAT CG4 LACTIC ACID, ED: LACTIC ACID, VENOUS: 0.63 mmol/L (ref 0.5–1.9)

## 2017-03-25 MED ORDER — SODIUM CHLORIDE 0.9 % IV BOLUS (SEPSIS)
1000.0000 mL | Freq: Once | INTRAVENOUS | Status: AC
  Administered 2017-03-25: 1000 mL via INTRAVENOUS

## 2017-03-25 NOTE — ED Provider Notes (Signed)
[facility] DEPT Provider Note   CSN: [id number] Arrival date & time:        History   Chief Complaint Chief Complaint  Patient presents with  . Dizziness    pt suddenly felt dizzy today and fell backwards     HPI Devin Hogan is a 83 y.o. male.  HPI 83 year old male with past medical history as below who presents with near syncopal episode. Patient reports that over the last 24 hours, he has not been drinking as much as he normally does. He went to the police station today to pay a bill. He got up after sitting in his car. He began walking to the register when he started to feel lightheaded, "woozy" with transient blurred vision. He felt as though he was going to pass out so he sat down and his symptoms resolved. He stood back up and his symptoms recurred again so he was brought to the ER for evaluation. Denies any associated chest pain or shortness of breath. Denies any focal numbness or weakness. He did not actually pass out and denies any head trauma. He has not ever had similar symptoms. No recent medication changes. No fevers or chills. No abdominal pain.   Past Medical History:  Diagnosis Date  . Acute gouty arthropathy 12/13/2010  . ALLERGIC RHINITIS 05/22/2007  . BENIGN PROSTATIC HYPERTROPHY 05/22/2007  . COLONIC POLYPS, HX OF 11/19/2007  . ERECTILE DYSFUNCTION 05/22/2007  . Gout 05/17/2011  . HYPERLIPIDEMIA 05/22/2007  . HYPERTENSION 05/22/2007  . RENAL INSUFFICIENCY 11/18/2009  . SKIN LESION 11/16/2010    Patient Active Problem List   Diagnosis Date Noted  . Pyuria 12/07/2016  . Encounter for general adult medical examination with abnormal findings 12/09/2015  . UTI (lower urinary tract infection) 05/31/2012  . Nocturia 05/31/2012  . Thrombocytopenia ([place]) 12/05/2011  . Gout 05/17/2011  . CKD (chronic kidney disease) 11/18/2009  . COLONIC POLYPS, HX OF 11/19/2007  . Hyperlipidemia 05/22/2007  . ERECTILE DYSFUNCTION 05/22/2007  . Essential hypertension 05/22/2007    . ALLERGIC RHINITIS 05/22/2007  . BENIGN PROSTATIC HYPERTROPHY 05/22/2007    History reviewed. No pertinent surgical history.     Home Medications    Prior to Admission medications   Medication Sig Start Date End Date Taking? Authorizing Provider  aspirin 81 MG EC tablet Take 81 mg by mouth daily.     Yes [provider]  CARTIA XT 240 MG 24 hr capsule TAKE ONE CAPSULE BY MOUTH ONCE DAILY 01/18/17  Yes Biagio Borg, MD  colchicine 0.6 MG tablet Take 1 tablet by mouth at the onset of flair (0.6 mg) then 1/2 tablet (0.3 mg) 1 hour later - do not repeat for 3 days. 08/24/15  Yes Golden Circle, FNP  hydrochlorothiazide (MICROZIDE) 12.5 MG capsule TAKE ONE CAPSULE BY MOUTH ONCE DAILY 02/08/16  Yes Biagio Borg, MD  Hypromellose (ARTIFICIAL TEARS OP) Place 1 drop into both eyes daily as needed (dry eyes).   Yes [provider]  indomethacin (INDOCIN) 25 MG capsule Take 1 capsule (25 mg total) by mouth 3 (three) times daily as needed. 08/24/15  Yes Golden Circle, FNP  lovastatin (MEVACOR) 20 MG tablet TAKE ONE TABLET BY MOUTH ONCE DAILY 01/18/17  Yes Biagio Borg, MD    Family History No family history on file.  Social History Social History  Substance Use Topics  . Smoking status: Former Research scientist (life sciences)  . Smokeless tobacco: Never Used     Comment: quit when he was  very yourg  . Alcohol use 0.0 oz/week     Comment: "couple of drinks per day" takes drink on the weekend     Allergies   Patient has no known allergies.   Review of Systems Review of Systems  Constitutional: Negative for chills, fatigue and fever.  HENT: Negative for congestion and rhinorrhea.   Eyes: Negative for visual disturbance.  Respiratory: Negative for cough, shortness of breath and wheezing.   Cardiovascular: Negative for chest pain and leg swelling.  Gastrointestinal: Negative for abdominal pain, diarrhea, nausea and vomiting.  Genitourinary: Negative for dysuria and flank pain.   Musculoskeletal: Negative for neck pain and neck stiffness.  Skin: Negative for rash and wound.  Allergic/Immunologic: Negative for immunocompromised state.  Neurological: Positive for light-headedness. Negative for syncope, weakness and headaches.  All other systems reviewed and are negative.    Physical Exam Updated Vital Signs BP (!) 142/81   Pulse 83   Temp 98.6 F (37 C) (Oral)   Resp 16   Ht 5\' 9"  (1.753 m)   Wt 76.7 kg (169 lb)   SpO2 98%   BMI 24.96 kg/m   Physical Exam  Constitutional: He is oriented to person, place, and time. He appears well-developed and well-nourished. No distress.  HENT:  Head: Normocephalic and atraumatic.  Mildly dry MM  Eyes: Conjunctivae are normal.  Neck: Neck supple.  Cardiovascular: Normal rate, regular rhythm and normal heart sounds.  Exam reveals no friction rub.   No murmur heard. Pulmonary/Chest: Effort normal and breath sounds normal. No respiratory distress. He has no wheezes. He has no rales.  Abdominal: He exhibits no distension.  Musculoskeletal: He exhibits no edema.  Neurological: He is alert and oriented to person, place, and time. He exhibits normal muscle tone.  Skin: Skin is warm. Capillary refill takes less than 2 seconds.  Psychiatric: He has a normal mood and affect.  Nursing note and vitals reviewed.   Neurological Exam:  Mental Status: Alert and oriented to person, place, and time. Attention and concentration normal. Speech clear. Recent memory is intact. Cranial Nerves: Visual fields grossly intact. EOMI and PERRLA. No nystagmus noted. Facial sensation intact at forehead, maxillary cheek, and chin/mandible bilaterally. No facial asymmetry or weakness. Hearing grossly normal. Uvula is midline, and palate elevates symmetrically. Normal SCM and trapezius strength. Tongue midline without fasciculations. Motor: Muscle strength 5/5 in proximal and distal UE and LE bilaterally. No pronator drift. Muscle tone  normal. Reflexes: 2+ and symmetrical in all four extremities.  Sensation: Intact to light touch in upper and lower extremities distally bilaterally.  Gait: Normal without ataxia. Coordination: Normal FTN bilaterally.     ED Treatments / Results  Labs (all labs ordered are listed, but only abnormal results are displayed) Labs Reviewed  CBC WITH DIFFERENTIAL/PLATELET - Abnormal; Notable for the following:       Result Value   WBC 2.9 (*)    RBC 4.07 (*)    Hemoglobin 12.3 (*)    HCT 38.0 (*)    Platelets 91 (*)    Neutro Abs 1.6 (*)    All other components within normal limits  COMPREHENSIVE METABOLIC PANEL - Abnormal; Notable for the following:    CO2 21 (*)    BUN 27 (*)    Creatinine, Ser 1.85 (*)    Calcium 8.8 (*)    ALT 8 (*)    GFR calc non Af Amer 31 (*)    GFR calc Af Amer 36 (*)  All other components within normal limits  I-STAT CG4 LACTIC ACID, ED  I-STAT TROPOININ, ED    EKG  EKG Interpretation  Date/Time:  Monday Mar 25 2017 11:57:59 EDT Ventricular Rate:  77 PR Interval:    QRS Duration: 136 QT Interval:  422 QTC Calculation: 478 R Axis:   -52 Text Interpretation:  Sinus rhythm RBBB and LAFB Left ventricular hypertrophy Anterior Q waves, possibly due to LVH ST elevation, consider inferior injury No significant change since last tracing No ST elevations or reciprocal changes Confirmed by Ellender Hose MD, Anuhea Gassner [phone]) on 03/25/2017 6:16:43 PM       Radiology Dg Chest 2 View  Result Date: 03/25/2017 CLINICAL DATA:  Cough EXAM: CHEST  2 VIEW COMPARISON:  01/31/2011 FINDINGS: Normal heart size. Aortic atherosclerosis noted. No pleural effusion or edema. There is moderate asymmetric elevation of right hemidiaphragm. No airspace opacities identified. Spondylosis noted within the thoracic spine. IMPRESSION: 1. No acute cardiopulmonary abnormalities. 2.  Aortic Atherosclerosis (ICD10-I70.0). 3. Chronic asymmetric elevation of right hemidiaphragm. Electronically  Signed   By: Kerby Moors M.D.   On: 03/25/2017 13:47   Ct Head Wo Contrast  Result Date: 03/25/2017 CLINICAL DATA:  Syncopal episode EXAM: CT HEAD WITHOUT CONTRAST TECHNIQUE: Contiguous axial images were obtained from the base of the skull through the vertex without intravenous contrast. COMPARISON:  None. FINDINGS: Brain: No acute territorial infarction, hemorrhage or intracranial mass. Mild to moderate atrophy. Ventricles nonenlarged. Mild periventricular and deep white matter small vessel ischemic changes. Vascular: No hyperdense vessels. Carotid and vertebral artery calcifications. Skull: Normal. Negative for fracture or focal lesion. Sinuses/Orbits: Mild mucosal thickening in the ethmoid sinuses. No acute orbital abnormality. Bilateral lens extraction. Other: None IMPRESSION: No CT evidence for acute intracranial abnormality. Atrophy and small vessel ischemic changes of the white matter Electronically Signed   By: Donavan Foil M.D.   On: 03/25/2017 14:06    Procedures Procedures (including critical care time)  Medications Ordered in ED Medications  sodium chloride 0.9 % bolus 1,000 mL (0 mLs Intravenous Stopped 03/25/17 1509)     Initial Impression / Assessment and Plan / ED Course  I have reviewed the triage vital signs and the nursing notes.  Pertinent labs & imaging results that were available during my care of the patient were reviewed by me and considered in my medical decision making (see chart for details).     83 year old male here with positional near syncope in the setting of standing up, with recurrence of symptoms upon standing up in the ER. He is markedly orthostatic and I suspect his symptoms are secondary to orthostatic hypotension the setting of mild dehydration. He had chronic kidney disease but this is largely at his baseline, although his bicarbonate is mildly decreased consistent with dehydration. Otherwise, he had no associated chest pain, EKG is nonischemic, and  there is no evidence of arrhythmia. Trop neg which is reassuring in setting of CKD (difficulty crossing over but negative (0) per review in minilab). His screening lab work is otherwise at baseline. Chest x-ray and CT head are negative. His symptoms have resolved with 1 L of IV fluid and he is now ambulatory without difficulty. Given history and exam highly consistent with orthostasis with no apparent alternative etiologies such as ACS, arrhythmia, or CVA, will discharge with encourage fluids and outpatient follow-up.  Final Clinical Impressions(s) / ED Diagnoses   Final diagnoses:  Orthostasis  Dehydration    New Prescriptions Discharge Medication List as of 03/25/2017  3:21 PM  Duffy Bruce, MD 03/25/17 [id number]

## 2017-03-25 NOTE — Discharge Instructions (Signed)
-   Drink at least 6-8 glasses of water over the next 24 hours.  - Be very careful when going from sitting to standing. Take your time and do not stand immediately. Sit up, wait 5 minutes, then stand, and wait 5 minutes more before walking. It is important to do this to prevent passing out.

## 2017-03-25 NOTE — ED Notes (Signed)
Patient ambulated in hallway. Gait slow, steady. Denies dizziness.

## 2017-03-25 NOTE — ED Notes (Signed)
Patient transported to X-ray 

## 2017-03-25 NOTE — ED Triage Notes (Signed)
Pt arrives awake and alert with no complaints while he was at the police station today he suddenly became dizzy and fell backwards

## 2017-03-27 ENCOUNTER — Encounter: Payer: Self-pay | Admitting: Internal Medicine

## 2017-03-27 ENCOUNTER — Ambulatory Visit (INDEPENDENT_AMBULATORY_CARE_PROVIDER_SITE_OTHER): Payer: Medicare Other | Admitting: Internal Medicine

## 2017-03-27 VITALS — BP 128/78 | HR 72 | Ht 69.0 in | Wt 171.0 lb

## 2017-03-27 DIAGNOSIS — I10 Essential (primary) hypertension: Secondary | ICD-10-CM | POA: Diagnosis not present

## 2017-03-27 DIAGNOSIS — R42 Dizziness and giddiness: Secondary | ICD-10-CM | POA: Diagnosis not present

## 2017-03-27 DIAGNOSIS — N183 Chronic kidney disease, stage 3 unspecified: Secondary | ICD-10-CM

## 2017-03-27 MED ORDER — ATENOLOL 25 MG PO TABS: 25.0000 mg | ORAL_TABLET | Freq: Every day | ORAL | 3 refills | Status: DC

## 2017-03-27 NOTE — Assessment & Plan Note (Signed)
Not ace or arb candidate, stable overall by history and exam, recent data reviewed with pt, and pt to continue medical treatment as before,  to f/u any worsening symptoms or concerns ' Lab Results  Component Value Date   CREATININE 1.85 (H) 03/25/2017

## 2017-03-27 NOTE — Patient Instructions (Signed)
OK to stop the fluid pill  Please take all new medication as prescribed - the low dose atenolol 25 mg per day  Please continue all other medications as before, and refills have been done if requested.  Please have the pharmacy call with any other refills you may need.  Please keep your appointments with your specialists as you may have planned

## 2017-03-27 NOTE — Assessment & Plan Note (Signed)
Improved likely lower volume related, cont to encourage po fluids

## 2017-03-27 NOTE — Assessment & Plan Note (Signed)
Controlled, but d/c hct as likely aggrevating current po intake status, and add atenolol 25 qd, cont all other tx, f/u BP at home and next visit

## 2017-03-27 NOTE — Progress Notes (Signed)
Subjective:    Patient ID: Devin Hogan, male    DOB: [birth year], 83 y.o.   MRN: [id number]  HPI  83 year old male with past medical history as below who presented with near syncopal episode to ED 5/21 with hx and exam c/w mild dehydration, improved with IVF bolus and felt safe for home.  HCT not stopped.  No recent n/v/d though wife states "he doesn't drink enough water" and she has been pushing him for the last 2 days. He has had no further dizziness, syncope or falls.  Has known CKD, not ACE or ARB candidate  Pt denies fever, wt loss, night sweats, loss of appetite, or other constitutional symptoms Past Medical History:  Diagnosis Date  . Acute gouty arthropathy 12/13/2010  . ALLERGIC RHINITIS 05/22/2007  . BENIGN PROSTATIC HYPERTROPHY 05/22/2007  . COLONIC POLYPS, HX OF 11/19/2007  . ERECTILE DYSFUNCTION 05/22/2007  . Gout 05/17/2011  . HYPERLIPIDEMIA 05/22/2007  . HYPERTENSION 05/22/2007  . RENAL INSUFFICIENCY 11/18/2009  . SKIN LESION 11/16/2010   No past surgical history on file.  reports that he has quit smoking. He has never used smokeless tobacco. He reports that he drinks alcohol. He reports that he does not use drugs. family history is not on file. No Known Allergies Current Outpatient Prescriptions on File Prior to Visit  Medication Sig Dispense Refill  . aspirin 81 MG EC tablet Take 81 mg by mouth daily.      [name] CARTIA XT 240 MG 24 hr capsule TAKE ONE CAPSULE BY MOUTH ONCE DAILY 90 capsule 1  . colchicine 0.6 MG tablet Take 1 tablet by mouth at the onset of flair (0.6 mg) then 1/2 tablet (0.3 mg) 1 hour later - do not repeat for 3 days. 2 tablet 0  . Hypromellose (ARTIFICIAL TEARS OP) Place 1 drop into both eyes daily as needed (dry eyes).    . indomethacin (INDOCIN) 25 MG capsule Take 1 capsule (25 mg total) by mouth 3 (three) times daily as needed. 30 capsule 0  . lovastatin (MEVACOR) 20 MG tablet TAKE ONE TABLET BY MOUTH ONCE DAILY 90 tablet 1  . [DISCONTINUED] diltiazem  (DILACOR XR) 240 MG 24 hr capsule Take 1 capsule (240 mg total) by mouth daily. 30 capsule 11   No current facility-administered medications on file prior to visit.     Review of Systems  Constitutional: Negative for other unusual diaphoresis or sweats HENT: Negative for ear discharge or swelling Eyes: Negative for other worsening visual disturbances Respiratory: Negative for stridor or other swelling  Gastrointestinal: Negative for worsening distension or other blood Genitourinary: Negative for retention or other urinary change Musculoskeletal: Negative for other MSK pain or swelling Skin: Negative for color change or other new lesions Neurological: Negative for worsening tremors and other numbness  Psychiatric/Behavioral: Negative for worsening agitation or other fatigue All other system neg per pt    Objective:   Physical Exam BP 128/78   Pulse 72   Ht 5\' 9"  (1.753 m)   Wt 171 lb (77.6 kg)   SpO2 98%   BMI 25.25 kg/m  VS noted, not ill appearing Constitutional: Pt appears in NAD HENT: Head: NCAT.  Right Ear: External ear normal.  Left Ear: External ear normal.  Eyes: . Pupils are equal, round, and reactive to light. Conjunctivae and EOM are normal Nose: without d/c or deformity Neck: Neck supple. Gross normal ROM Cardiovascular: Normal rate and regular rhythm.   Pulmonary/Chest: Effort normal and breath sounds without rales or  wheezing.  Abd:  Soft, NT, ND, + BS, no organomegaly Neurological: Pt is alert. At baseline orientation, motor grossly intact Skin: Skin is warm. No rashes, other new lesions, no LE edema Psychiatric: Pt behavior is normal without agitation  No other exam findings  Lab Results  Component Value Date   WBC 2.9 (L) 03/25/2017   HGB 12.3 (L) 03/25/2017   HCT 38.0 (L) 03/25/2017   PLT 91 (L) 03/25/2017   GLUCOSE 96 03/25/2017   CHOL 149 12/07/2016   TRIG 82.0 12/07/2016   HDL 57.50 12/07/2016   LDLCALC 75 12/07/2016   ALT 8 (L) 03/25/2017    AST 17 03/25/2017   NA 140 03/25/2017   K 4.4 03/25/2017   CL 111 03/25/2017   CREATININE 1.85 (H) 03/25/2017   BUN 27 (H) 03/25/2017   CO2 21 (L) 03/25/2017   TSH 3.53 06/05/2016   PSA 0.63 05/12/2010       Assessment & Plan:

## 2017-04-08 ENCOUNTER — Encounter: Payer: Self-pay | Admitting: Hematology

## 2017-04-08 ENCOUNTER — Telehealth: Payer: Self-pay | Admitting: Hematology

## 2017-04-08 NOTE — Telephone Encounter (Signed)
Received a call from [facility] at [facility] to schedule the pt an appt to see Dr. Irene Limbo on 6/13. He will notify the pt. Letter mailed.

## 2017-04-17 ENCOUNTER — Ambulatory Visit (HOSPITAL_BASED_OUTPATIENT_CLINIC_OR_DEPARTMENT_OTHER): Payer: Medicare Other | Admitting: Hematology

## 2017-04-17 ENCOUNTER — Telehealth: Payer: Self-pay | Admitting: Hematology

## 2017-04-17 ENCOUNTER — Ambulatory Visit (HOSPITAL_BASED_OUTPATIENT_CLINIC_OR_DEPARTMENT_OTHER): Payer: Medicare Other

## 2017-04-17 ENCOUNTER — Encounter: Payer: Self-pay | Admitting: Hematology

## 2017-04-17 VITALS — BP 115/54 | HR 45 | Temp 98.0°F | Resp 18 | Ht 69.0 in | Wt 173.5 lb

## 2017-04-17 DIAGNOSIS — D649 Anemia, unspecified: Secondary | ICD-10-CM | POA: Diagnosis not present

## 2017-04-17 DIAGNOSIS — D696 Thrombocytopenia, unspecified: Secondary | ICD-10-CM | POA: Diagnosis not present

## 2017-04-17 DIAGNOSIS — D72819 Decreased white blood cell count, unspecified: Secondary | ICD-10-CM | POA: Diagnosis not present

## 2017-04-17 DIAGNOSIS — D472 Monoclonal gammopathy: Secondary | ICD-10-CM

## 2017-04-17 LAB — CBC & DIFF AND RETIC
BASO%: 0.9 % (ref 0.0–2.0)
BASOS ABS: 0 10*3/uL (ref 0.0–0.1)
EOS ABS: 0.2 10*3/uL (ref 0.0–0.5)
EOS%: 5 % (ref 0.0–7.0)
HEMATOCRIT: 35.9 % — AB (ref 38.4–49.9)
HEMOGLOBIN: 11.6 g/dL — AB (ref 13.0–17.1)
IMMATURE RETIC FRACT: 7.2 % (ref 3.00–10.60)
LYMPH%: 32.2 % (ref 14.0–49.0)
MCH: 30.9 pg (ref 27.2–33.4)
MCHC: 32.3 g/dL (ref 32.0–36.0)
MCV: 95.5 fL (ref 79.3–98.0)
MONO#: 0.6 10*3/uL (ref 0.1–0.9)
MONO%: 18.6 % — ABNORMAL HIGH (ref 0.0–14.0)
NEUT#: 1.5 10*3/uL (ref 1.5–6.5)
NEUT%: 43.3 % (ref 39.0–75.0)
PLATELETS: 99 10*3/uL — AB (ref 140–400)
RBC: 3.76 10*6/uL — AB (ref 4.20–5.82)
RDW: 14.8 % — ABNORMAL HIGH (ref 11.0–14.6)
RETIC CT ABS: 30.08 10*3/uL — AB (ref 34.80–93.90)
Retic %: 0.8 % (ref 0.80–1.80)
WBC: 3.4 10*3/uL — ABNORMAL LOW (ref 4.0–10.3)
lymph#: 1.1 10*3/uL (ref 0.9–3.3)

## 2017-04-17 LAB — COMPREHENSIVE METABOLIC PANEL
ALBUMIN: 3.4 g/dL — AB (ref 3.5–5.0)
ANION GAP: 8 meq/L (ref 3–11)
AST: 12 U/L (ref 5–34)
Alkaline Phosphatase: 50 U/L (ref 40–150)
BILIRUBIN TOTAL: 0.32 mg/dL (ref 0.20–1.20)
BUN: 18.8 mg/dL (ref 7.0–26.0)
CO2: 22 meq/L (ref 22–29)
CREATININE: 1.8 mg/dL — AB (ref 0.7–1.3)
Calcium: 8.9 mg/dL (ref 8.4–10.4)
Chloride: 114 mEq/L — ABNORMAL HIGH (ref 98–109)
EGFR: 38 mL/min/{1.73_m2} — ABNORMAL LOW (ref >90)
Glucose: 92 mg/dl (ref 70–140)
Potassium: 4.5 mEq/L (ref 3.5–5.1)
Sodium: 144 mEq/L (ref 136–145)
TOTAL PROTEIN: 6.9 g/dL (ref 6.4–8.3)

## 2017-04-17 LAB — TECHNOLOGIST REVIEW

## 2017-04-17 LAB — LACTATE DEHYDROGENASE: LDH: 196 U/L (ref 125–245)

## 2017-04-17 NOTE — Patient Instructions (Signed)
Thank you for choosing [facility] to provide your oncology and hematology care.  To afford each patient quality time with our providers, please arrive 30 minutes before your scheduled appointment time.  If you arrive late for your appointment, you may be asked to reschedule.  We strive to give you quality time with our providers, and arriving late affects you and other patients whose appointments are after yours.  If you are a no show for multiple scheduled visits, you may be dismissed from the clinic at the providers discretion.   Again, thank you for choosing [facility], our hope is that these requests will decrease the amount of time that you wait before being seen by our physicians.  ______________________________________________________________________ Should you have questions after your visit to the [facility], please contact our office at [phone] between the hours of 8:30 and 4:30 p.m.    Voicemails left after 4:30p.m will not be returned until the following business day.   For prescription refill requests, please have your pharmacy contact us directly.  Please also try to allow 48 hours for prescription requests.   Please contact the scheduling department for questions regarding scheduling.  For scheduling of procedures such as PET scans, CT scans, MRI, Ultrasound, etc please contact central scheduling at [phone].   Resources For Cancer Patients and Caregivers:  American Cancer Society:  800-227-2345  Can help patients locate various types of support and financial assistance Cancer Care: 1-800-813-HOPE (4673) Provides financial assistance, online support groups, medication/co-pay assistance.   [place] DSS:  336-641-3447 Where to apply for food stamps, Medicaid, and utility assistance Medicare Rights Center: 800-333-4114 Helps people with Medicare understand their rights and benefits, navigate the Medicare system, and secure the  quality healthcare they deserve SCAT: 336-333-6589 [place] [employer]'s shared-ride transportation service for eligible riders who have a disability that prevents them from riding the fixed route bus.   For additional information on assistance programs please contact our social worker:   Grier Hock/Abigail Elmore:  [phone] 

## 2017-04-17 NOTE — Telephone Encounter (Signed)
Lab added for today, per 04/17/17 los. Lab and follow up with Dr Irene Limbo, scheduled for 4 months, per 04/17/17 los. Patient was given a copy of the AVS report and appointment schedule, per 04/17/17 los.

## 2017-04-18 LAB — KAPPA/LAMBDA LIGHT CHAINS
IG KAPPA FREE LIGHT CHAIN: 56.4 mg/L — AB (ref 3.3–19.4)
Ig Lambda Free Light Chain: 23.2 mg/L (ref 5.7–26.3)
Kappa/Lambda FluidC Ratio: 2.43 — ABNORMAL HIGH (ref 0.26–1.65)

## 2017-04-19 DIAGNOSIS — D472 Monoclonal gammopathy: Secondary | ICD-10-CM | POA: Diagnosis not present

## 2017-04-19 LAB — MULTIPLE MYELOMA PANEL, SERUM
ALBUMIN SERPL ELPH-MCNC: 3.4 g/dL (ref 2.9–4.4)
Albumin/Glob SerPl: 1.1 (ref 0.7–1.7)
Alpha 1: 0.3 g/dL (ref 0.0–0.4)
Alpha2 Glob SerPl Elph-Mcnc: 0.8 g/dL (ref 0.4–1.0)
B-GLOBULIN SERPL ELPH-MCNC: 0.8 g/dL (ref 0.7–1.3)
Gamma Glob SerPl Elph-Mcnc: 1.3 g/dL (ref 0.4–1.8)
Globulin, Total: 3.2 g/dL (ref 2.2–3.9)
IGA/IMMUNOGLOBULIN A, SERUM: 162 mg/dL (ref 61–437)
IgG, Qn, Serum: 1275 mg/dL (ref 700–1600)
IgM, Qn, Serum: 38 mg/dL (ref 15–143)
M PROTEIN SERPL ELPH-MCNC: 0.6 g/dL — AB
TOTAL PROTEIN: 6.6 g/dL (ref 6.0–8.5)

## 2017-04-22 LAB — UPEP/TP, 24-HR URINE
Albumin, U: 34.1 %
Alpha-1-Globulin, U: 0 %
Alpha-2-Globulin, U: 3.2 %
Beta Globulin, U: 11.3 %
GAMMA GLOBULIN, U: 51.3 %
PROTEIN UR: 15 mg/dL
Prot,24hr calculated: 60 mg/24 hr (ref 30–150)

## 2017-04-23 ENCOUNTER — Telehealth: Payer: Self-pay

## 2017-04-23 NOTE — Telephone Encounter (Signed)
Matt from [facility] was looking for OV note from Dr Irene Limbo for 04/17/17.  Please fax to [facility] at fax [phone] attn Dr Hollie Salk when note is completed.

## 2017-04-24 ENCOUNTER — Encounter: Payer: Self-pay | Admitting: Internal Medicine

## 2017-04-24 ENCOUNTER — Ambulatory Visit (INDEPENDENT_AMBULATORY_CARE_PROVIDER_SITE_OTHER): Payer: Medicare Other | Admitting: Internal Medicine

## 2017-04-24 VITALS — BP 114/66 | HR 66 | Ht 69.0 in | Wt 173.0 lb

## 2017-04-24 DIAGNOSIS — I10 Essential (primary) hypertension: Secondary | ICD-10-CM

## 2017-04-24 DIAGNOSIS — N183 Chronic kidney disease, stage 3 unspecified: Secondary | ICD-10-CM

## 2017-04-24 DIAGNOSIS — E785 Hyperlipidemia, unspecified: Secondary | ICD-10-CM

## 2017-04-24 NOTE — Assessment & Plan Note (Signed)
Lab Results  Component Value Date   LDLCALC 75 12/07/2016  stable overall by history and exam, recent data reviewed with pt, and pt to continue medical treatment as before,  to f/u any worsening symptoms or concerns

## 2017-04-24 NOTE — Assessment & Plan Note (Signed)
Felt to have AKI per may 2018 notes, note most recent cr stable at 1.8, cont to follow

## 2017-04-24 NOTE — Progress Notes (Signed)
Subjective:    Patient ID: Devin Hogan, male    DOB: Apr 07, 1928, 83 y.o.   MRN: [id number]  HPI  Here to f/u; overall doing ok, did have recent monoclonal paraprotein found per renal, and now followed by Dr Kale/hematology as well.  Pt denies chest pain, increasing sob or doe, wheezing, orthopnea, PND, increased LE swelling, palpitations, dizziness or syncope.  Stopping the hCT last visit seemed to have resolved the dizziness.   Pt denies new neurological symptoms such as new headache, or facial or extremity weakness or numbness.  Pt denies polydipsia, polyuria, or low sugar episode.  Pt states overall good compliance with meds, mostly trying to follow appropriate diet, with wt overall stable  No recent falls.   Wt Readings from Last 3 Encounters:  04/24/17 173 lb (78.5 kg)  04/17/17 173 lb 8 oz (78.7 kg)  03/27/17 171 lb (77.6 kg)   BP Readings from Last 3 Encounters:  04/24/17 114/66  04/17/17 (!) 115/54  03/27/17 128/78   Past Medical History:  Diagnosis Date  . Acute gouty arthropathy 12/13/2010  . ALLERGIC RHINITIS 05/22/2007  . BENIGN PROSTATIC HYPERTROPHY 05/22/2007  . COLONIC POLYPS, HX OF 11/19/2007  . ERECTILE DYSFUNCTION 05/22/2007  . Gout 05/17/2011  . HYPERLIPIDEMIA 05/22/2007  . HYPERTENSION 05/22/2007  . RENAL INSUFFICIENCY 11/18/2009  . SKIN LESION 11/16/2010   No past surgical history on file.  reports that he has quit smoking. He has never used smokeless tobacco. He reports that he drinks about 10.2 oz of alcohol per week . He reports that he does not use drugs. family history includes Lung cancer in his brother; Stroke in his sister. No Known Allergies Current Outpatient Prescriptions on File Prior to Visit  Medication Sig Dispense Refill  . aspirin 81 MG EC tablet Take 81 mg by mouth daily.      [name] atenolol (TENORMIN) 25 MG tablet Take 1 tablet (25 mg total) by mouth daily. 90 tablet 3  . CARTIA XT 240 MG 24 hr capsule TAKE ONE CAPSULE BY MOUTH ONCE DAILY 90 capsule 1    . colchicine 0.6 MG tablet Take 1 tablet by mouth at the onset of flair (0.6 mg) then 1/2 tablet (0.3 mg) 1 hour later - do not repeat for 3 days. 2 tablet 0  . Hypromellose (ARTIFICIAL TEARS OP) Place 1 drop into both eyes daily as needed (dry eyes).    [name] lovastatin (MEVACOR) 20 MG tablet TAKE ONE TABLET BY MOUTH ONCE DAILY 90 tablet 1  . [DISCONTINUED] diltiazem (DILACOR XR) 240 MG 24 hr capsule Take 1 capsule (240 mg total) by mouth daily. 30 capsule 11   No current facility-administered medications on file prior to visit.    Review of Systems  Constitutional: Negative for other unusual diaphoresis or sweats HENT: Negative for ear discharge or swelling Eyes: Negative for other worsening visual disturbances Respiratory: Negative for stridor or other swelling  Gastrointestinal: Negative for worsening distension or other blood Genitourinary: Negative for retention or other urinary change Musculoskeletal: Negative for other MSK pain or swelling Skin: Negative for color change or other new lesions Neurological: Negative for worsening tremors and other numbness  Psychiatric/Behavioral: Negative for worsening agitation or other fatigue All other system neg per pt    Objective:   Physical Exam BP 114/66   Pulse 66   Ht 5\' 9"  (1.753 m)   Wt 173 lb (78.5 kg)   SpO2 99%   BMI 25.55 kg/m  VS noted,  Constitutional:  Pt appears in NAD HENT: Head: NCAT.  Right Ear: External ear normal.  Left Ear: External ear normal.  Eyes: . Pupils are equal, round, and reactive to light. Conjunctivae and EOM are normal Nose: without d/c or deformity Neck: Neck supple. Gross normal ROM Cardiovascular: Normal rate and regular rhythm.   Pulmonary/Chest: Effort normal and breath sounds without rales or wheezing.  Neurological: Pt is alert. At baseline orientation, motor grossly intact Skin: Skin is warm. No rashes, other new lesions, no LE edema Psychiatric: Pt behavior is normal without agitation  No  other exam findings Lab Results  Component Value Date   WBC 3.4 (L) 04/17/2017   HGB 11.6 (L) 04/17/2017   HCT 35.9 (L) 04/17/2017   PLT 99 (L) 04/17/2017   GLUCOSE 92 04/17/2017   CHOL 149 12/07/2016   TRIG 82.0 12/07/2016   HDL 57.50 12/07/2016   LDLCALC 75 12/07/2016   ALT <6 04/17/2017   AST 12 04/17/2017   NA 144 04/17/2017   K 4.5 04/17/2017   CL 111 03/25/2017   CREATININE 1.8 (H) 04/17/2017   BUN 18.8 04/17/2017   CO2 22 04/17/2017   TSH 3.53 06/05/2016   PSA 0.63 05/12/2010       Assessment & Plan:

## 2017-04-24 NOTE — Assessment & Plan Note (Signed)
Stable off the HCT, cont same tx, f/u next visit

## 2017-04-24 NOTE — Patient Instructions (Signed)
Please continue all other medications as before, and refills have been done if requested.  Please have the pharmacy call with any other refills you may need.  Please continue your efforts at being more active, low cholesterol diet, and weight control.  You are otherwise up to date with prevention measures today.  Please keep your appointments with your specialists as you may have planned   

## 2017-05-23 ENCOUNTER — Telehealth: Payer: Self-pay | Admitting: *Deleted

## 2017-05-23 NOTE — Telephone Encounter (Signed)
Received call from [facility]  requesting office notes from Dr. Irene Limbo for date 04/17/17 to be faxed to their office. Catalina Antigua, medical assistant       Phone    [phone]   Ext   [phone]  ;    Fax     [phone].

## 2017-05-28 ENCOUNTER — Telehealth: Payer: Self-pay | Admitting: *Deleted

## 2017-05-28 ENCOUNTER — Ambulatory Visit (HOSPITAL_COMMUNITY)
Admission: RE | Admit: 2017-05-28 | Discharge: 2017-05-28 | Disposition: A | Discharge status: Home / Self Care | Payer: Medicare Other | Source: Ambulatory Visit | Attending: Hematology | Admitting: Hematology

## 2017-05-28 DIAGNOSIS — M5135 Other intervertebral disc degeneration, thoracolumbar region: Secondary | ICD-10-CM | POA: Insufficient documentation

## 2017-05-28 DIAGNOSIS — D472 Monoclonal gammopathy: Secondary | ICD-10-CM | POA: Diagnosis present

## 2017-05-28 DIAGNOSIS — C9000 Multiple myeloma not having achieved remission: Secondary | ICD-10-CM | POA: Diagnosis not present

## 2017-05-28 NOTE — Telephone Encounter (Signed)
Received vm call from Matt/[facility] requesting OV note from Dr Irene Limbo visit & asked that it be faxed to [phone].  He states that physician needs report.  Message to Dr Launa Flight RN

## 2017-05-31 ENCOUNTER — Telehealth: Payer: Self-pay

## 2017-05-31 NOTE — Progress Notes (Signed)
[name]    HEMATOLOGY/ONCOLOGY CONSULTATION NOTE  Date of Service: 05/31/2017  Patient Care Team: Biagio Borg, MD as PCP - General  CHIEF COMPLAINTS/PURPOSE OF CONSULTATION:  Abnormal SPEP  HISTORY OF PRESENTING ILLNESS:   Devin Hogan is a wonderful 83 y.o. male who has been referred to [place] by Dr Jenny Reichmann, Hunt Oris, MD/Dr Hollie Salk   for evaluation and management of monoclonal paraproteinemia.  Patient has a history of hypertension, dyslipidemia, gout, BPH chronic kidney disease with a baseline creatinine of 1.6-1.8 until February 2017 and creatinine of 1.99 in February 2018. He was seen by Dr. Hollie Salk and had a renal ultrasound on 12/13/2016 that did not show any obstructive uropathy from his BPH.  As a part of the workup for his chronic kidney disease a serum protein electrophoresis was done which showed an IgG level of 1500 IgA level of 178 IgM of 41 with immunofixation electrophoresis showing IgG kappa monoclonal antibody. M spike was newly elevated at 0.6 g/dL. Patient has serum free light chains done which showed some increase in kappa free light chains with A kappa lambda ratio of about 3 (62.8 : 20.8).  Labs on 02/05/2017 showed a creatinine of 1.94 with no hypercalcemia. CBC from that time was not available. Hysterectomy and has been 1.7 since January 2014 and today in clinic is 1.8. This suggest ability and his renal function over the last 4-1/2 years. This suggests chronic kidney disease likely due to hypertension and less likely related to multiple myeloma or AL Amyloidosis  Patient was referred to [place] for further evaluation of his monoclonal paraproteinemia.  Patient notes no fevers no chills no night sweats. Notes no focal bone pains. Labs done today show only some mild anemia with a hemoglobin of 11.6. He has some mild chronic leukopenia with WBC count between 2.8 and 3.7k since July 2014. ? Benign ethnic neutropenia. Also has had some mild thrombocytopenia in the 99-105 k range during the  same time. From July 2014.  Patient notes that he feels no differently now that he felt 6 months or year ago. No new fatigue.    MEDICAL HISTORY:  Past Medical History:  Diagnosis Date  . Acute gouty arthropathy 12/13/2010  . ALLERGIC RHINITIS 05/22/2007  . BENIGN PROSTATIC HYPERTROPHY 05/22/2007  . COLONIC POLYPS, HX OF 11/19/2007  . ERECTILE DYSFUNCTION 05/22/2007  . Gout 05/17/2011  . HYPERLIPIDEMIA 05/22/2007  . HYPERTENSION 05/22/2007  . RENAL INSUFFICIENCY 11/18/2009  . SKIN LESION 11/16/2010    SURGICAL HISTORY: History reviewed. No pertinent surgical history.  SOCIAL HISTORY: Social History   Social History  . Marital status: Married    Spouse name: N/A  . Number of children: 5  . Years of education: N/A   Occupational History  . retired Clinical cytogeneticist Retired   Social History Main Topics  . Smoking status: Former Research scientist (life sciences)  . Smokeless tobacco: Never Used     Comment: quit when he was very yourg  . Alcohol use 10.2 oz/week    4 Cans of beer, 1 Glasses of wine, 12 Shots of liquor per week     Comment: "couple of drinks per day" takes drink on the weekend  . Drug use: No  . Sexual activity: Not on file   Other Topics Concern  . Not on file   Social History Narrative  . No narrative on file    FAMILY HISTORY: Family History  Problem Relation Age of Onset  . Lung cancer Brother   . Stroke Sister  ALLERGIES:  has No Known Allergies.  MEDICATIONS:  Current Outpatient Prescriptions  Medication Sig Dispense Refill  . aspirin 81 MG EC tablet Take 81 mg by mouth daily.      [name] atenolol (TENORMIN) 25 MG tablet Take 1 tablet (25 mg total) by mouth daily. 90 tablet 3  . CARTIA XT 240 MG 24 hr capsule TAKE ONE CAPSULE BY MOUTH ONCE DAILY 90 capsule 1  . colchicine 0.6 MG tablet Take 1 tablet by mouth at the onset of flair (0.6 mg) then 1/2 tablet (0.3 mg) 1 hour later - do not repeat for 3 days. 2 tablet 0  . Hypromellose (ARTIFICIAL TEARS OP) Place 1 drop into  both eyes daily as needed (dry eyes).    [name] lovastatin (MEVACOR) 20 MG tablet TAKE ONE TABLET BY MOUTH ONCE DAILY 90 tablet 1   No current facility-administered medications for this visit.     REVIEW OF SYSTEMS:    10 Point review of Systems was done is negative except as noted above.  PHYSICAL EXAMINATION: ECOG PERFORMANCE STATUS: 1 - Symptomatic but completely ambulatory  . Vitals:   04/17/17 1242  BP: (!) 115/54  Pulse: (!) 45  Resp: 18  Temp: 98 F (36.7 C)   Filed Weights   04/17/17 1242  Weight: 173 lb 8 oz (78.7 kg)   .Body mass index is 25.62 kg/m.  GENERAL:alert, in no acute distress and comfortable SKIN: no acute rashes, no significant lesions EYES: conjunctiva are pink and non-injected, sclera anicteric OROPHARYNX: MMM, no exudates, no oropharyngeal erythema or ulceration NECK: supple, no JVD LYMPH:  no palpable lymphadenopathy in the cervical, axillary or inguinal regions LUNGS: clear to auscultation b/l with normal respiratory effort HEART: regular rate & rhythm ABDOMEN:  normoactive bowel sounds , non tender, not distended. Extremity: no pedal edema PSYCH: alert & oriented x 3 with fluent speech NEURO: no focal motor/sensory deficits  LABORATORY DATA:  I have reviewed the data as listed  . CBC Latest Ref Rng & Units 04/17/2017 03/25/2017 12/07/2016  WBC 4.0 - 10.3 10e3/uL 3.4(L) 2.9(L) 2.8(L)  Hemoglobin 13.0 - 17.1 g/dL 11.6(L) 12.3(L) 12.7(L)  Hematocrit 38.4 - 49.9 % 35.9(L) 38.0(L) 37.8(L)  Platelets 140 - 400 10e3/uL 99(L) 91(L) 93.0(L)    CMP Latest Ref Rng & Units 04/17/2017 04/17/2017 03/25/2017  Glucose 70 - 140 mg/dl 92 - 96  BUN 7.0 - 26.0 mg/dL 18.8 - 27(H)  Creatinine 0.7 - 1.3 mg/dL 1.8(H) - 1.85(H)  Sodium 136 - 145 mEq/L 144 - 140  Potassium 3.5 - 5.1 mEq/L 4.5 - 4.4  Chloride 101 - 111 mmol/L - - 111  CO2 22 - 29 mEq/L 22 - 21(L)  Calcium 8.4 - 10.4 mg/dL 8.9 - 8.8(L)  Total Protein 6.4 - 8.3 g/dL 6.9 6.6 6.8  Total Bilirubin 0.20  - 1.20 mg/dL 0.32 - 0.5  Alkaline Phos 40 - 150 U/L 50 - 53  AST 5 - 34 U/L 12 - 17  ALT 0-55 U/L U/L <6 - 8(L)           . Lab Results  Component Value Date   LDH 196 04/17/2017     RADIOGRAPHIC STUDIES: I have personally reviewed the radiological images as listed and agreed with the findings in the report. Dg Bone Survey Met  Result Date: 05/28/2017 CLINICAL DATA:  History of multiple myeloma.  No current complaints. EXAM: METASTATIC BONE SURVEY COMPARISON:  None in PACs FINDINGS: Calvarium: No lytic nor blastic lesion is observed. Upper  extremities:  No lytic or blastic lesions are observed. Spine: There is multilevel degenerative disc disease involving the cervical spine. Milder degenerative disc disease of the thoracic spine is observed. There is moderate degenerative disc disease of the lumbar spine. There is no compression fracture or lytic or blastic vertebral lesion. Chest: The right hemidiaphragm is elevated. No focal lung lesions are observed. The heart and pulmonary vascularity are normal. Is calcification in the wall of the aortic arch. The observed ribs are unremarkable. Pelvis: No lytic nor blastic pelvic lesion is observed. Lower extremities:  No lytic or blastic bony lesion is observed. IMPRESSION: No bony lesions are observed to suggest active myeloma. There is multilevel degenerative disc disease of the spine. There is calcification in the wall of the thoracic and abdominal aorta. Electronically Signed   By: David  Martinique M.D.   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 Patient has a minor M spike of 0.6 g/dL. Borderline elevated and abnormal serum kappa lambda free light chains and ratio. This is partly due to decreased light chain clearance due to chronic kidney disease. Patient has mildly elevated creatinine which has been relatively stable since 2014 in  the 1.6-1.8 creatinine range. No hypercalcemia. 24-hour urine protein shows no monoclonal protein and minimal proteinuria suggestive more of hypertensive nephropathy. Borderline anemia at that does not meet criteria for multiple myeloma and is likely related to chronic kidney disease. Whole body skeletal survey shows no bony lesions to suggest active myeloma.  Plan -Overall lab and radiographic picture is suggestive of monoclonal gammopathy of undetermined significance. -At this point I do not feel the patient's chronic kidney disease is related to multiple myeloma or AL Amyloidosis. -Pros and cons of bone marrow biopsy or definitive diagnosis were discussed with the patient and he prefers to hold off on this currently unless any new other concerning symptoms or features arise.  #2 borderline normocytic normochromic anemia with a hemoglobin of 11.6. Likely related to chronic kidney disease. Plan -Continue follow-up for anemia  -Reasonable to consider oral iron replacement to maintain ferritin levels all the 100 in the setting of chronic kidney disease.  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 This has been mild and his neutropenia has been borderline. 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 -No indication for G-CSF at this time. -Monitor  #4 mild thrombocytopenia -No indication for intervention at this time. Monitor  Labs today  Skeletal survey in 1 week RTC with Dr Irene Limbo in 4 months with labs  All of the patients questions were answered with apparent satisfaction. The patient knows to call the clinic with any problems, questions or concerns.  I spent 45 minutes counseling the patient face to face. The total time spent in the appointment was 60 minutes and more than 50% was on counseling and direct patient cares.    Devin Lone MD [place] AAHIVMS [facility] Hematology/Oncology Physician [facility]  (Office):       [phone] (Work  cell):  [phone] (Fax):           [phone]  05/31/2017 1:02 PM

## 2017-05-31 NOTE — Telephone Encounter (Signed)
Fax sent with office note from 04/17/17. Confirmed fax receipt.

## 2017-06-05 DIAGNOSIS — M109 Gout, unspecified: Secondary | ICD-10-CM | POA: Diagnosis not present

## 2017-06-05 DIAGNOSIS — D472 Monoclonal gammopathy: Secondary | ICD-10-CM | POA: Diagnosis not present

## 2017-06-05 DIAGNOSIS — I129 Hypertensive chronic kidney disease with stage 1 through stage 4 chronic kidney disease, or unspecified chronic kidney disease: Secondary | ICD-10-CM | POA: Diagnosis not present

## 2017-06-05 DIAGNOSIS — N183 Chronic kidney disease, stage 3 (moderate): Secondary | ICD-10-CM | POA: Diagnosis not present

## 2017-06-06 ENCOUNTER — Other Ambulatory Visit (INDEPENDENT_AMBULATORY_CARE_PROVIDER_SITE_OTHER): Payer: Medicare Other

## 2017-06-06 ENCOUNTER — Encounter: Payer: Self-pay | Admitting: Internal Medicine

## 2017-06-06 ENCOUNTER — Ambulatory Visit (INDEPENDENT_AMBULATORY_CARE_PROVIDER_SITE_OTHER): Payer: Medicare Other | Admitting: Internal Medicine

## 2017-06-06 VITALS — BP 102/64 | HR 60 | Ht 69.0 in | Wt 173.0 lb

## 2017-06-06 DIAGNOSIS — I10 Essential (primary) hypertension: Secondary | ICD-10-CM

## 2017-06-06 DIAGNOSIS — Z0000 Encounter for general adult medical examination without abnormal findings: Secondary | ICD-10-CM | POA: Diagnosis not present

## 2017-06-06 DIAGNOSIS — D472 Monoclonal gammopathy: Secondary | ICD-10-CM | POA: Diagnosis not present

## 2017-06-06 LAB — LIPID PANEL
CHOL/HDL RATIO: 3
CHOLESTEROL: 134 mg/dL (ref 0–200)
HDL: 51 mg/dL (ref >39.00)
LDL CALC: 71 mg/dL (ref 0–99)
NonHDL: 83.45
TRIGLYCERIDES: 60 mg/dL (ref 0.0–149.0)
VLDL: 12 mg/dL (ref 0.0–40.0)

## 2017-06-06 LAB — URINALYSIS, ROUTINE W REFLEX MICROSCOPIC
BILIRUBIN URINE: NEGATIVE
KETONES UR: NEGATIVE
NITRITE: POSITIVE — AB
PH: 5.5 (ref 5.0–8.0)
Specific Gravity, Urine: 1.02 (ref 1.000–1.030)
TOTAL PROTEIN, URINE-UPE24: NEGATIVE
UROBILINOGEN UA: 0.2 (ref 0.0–1.0)
Urine Glucose: NEGATIVE

## 2017-06-06 LAB — TSH: TSH: 3.65 u[IU]/mL (ref 0.35–4.50)

## 2017-06-06 NOTE — Patient Instructions (Signed)

## 2017-06-06 NOTE — Progress Notes (Signed)
Subjective:    Patient ID: Devin Hogan, male    DOB: 1927-12-16, 83 y.o.   MRN: [id number]  HPI  Here for wellness and f/u;  Overall doing ok;  Pt denies Chest pain, worsening SOB, DOE, wheezing, orthopnea, PND, worsening LE edema, palpitations, dizziness or syncope.  Pt denies neurological change such as new headache, facial or extremity weakness.  Pt denies polydipsia, polyuria, or low sugar symptoms. Pt states overall good compliance with treatment and medications, good tolerability, and has been trying to follow appropriate diet.  Pt denies worsening depressive symptoms, suicidal ideation or panic. No fever, night sweats, wt loss, loss of appetite, or other constitutional symptoms.  Pt states good ability with ADL's, has low fall risk, home safety reviewed and adequate, no other significant changes in hearing or vision, and occasionally active with exercise with walking, but less in the past 6 months overall.  Has seen 6/13 hematology with MGUS and mild heme abnormalities not felt to require further eval and tx except pt declines BM Bx, and to f/u with labs at 4 mo.  Saw renal yesterday with stable cr, with planned at one yr.   No new complaints Past Medical History:  Diagnosis Date  . Acute gouty arthropathy 12/13/2010  . ALLERGIC RHINITIS 05/22/2007  . BENIGN PROSTATIC HYPERTROPHY 05/22/2007  . COLONIC POLYPS, HX OF 11/19/2007  . ERECTILE DYSFUNCTION 05/22/2007  . Gout 05/17/2011  . HYPERLIPIDEMIA 05/22/2007  . HYPERTENSION 05/22/2007  . RENAL INSUFFICIENCY 11/18/2009  . SKIN LESION 11/16/2010   No past surgical history on file.  reports that he has quit smoking. He has never used smokeless tobacco. He reports that he drinks about 10.2 oz of alcohol per week . He reports that he does not use drugs. family history includes Lung cancer in his brother; Stroke in his sister. No Known Allergies Current Outpatient Prescriptions on File Prior to Visit  Medication Sig Dispense Refill  . aspirin 81 MG  EC tablet Take 81 mg by mouth daily.      [name] atenolol (TENORMIN) 25 MG tablet Take 1 tablet (25 mg total) by mouth daily. 90 tablet 3  . CARTIA XT 240 MG 24 hr capsule TAKE ONE CAPSULE BY MOUTH ONCE DAILY 90 capsule 1  . colchicine 0.6 MG tablet Take 1 tablet by mouth at the onset of flair (0.6 mg) then 1/2 tablet (0.3 mg) 1 hour later - do not repeat for 3 days. 2 tablet 0  . Hypromellose (ARTIFICIAL TEARS OP) Place 1 drop into both eyes daily as needed (dry eyes).    [name] lovastatin (MEVACOR) 20 MG tablet TAKE ONE TABLET BY MOUTH ONCE DAILY 90 tablet 1  . [DISCONTINUED] diltiazem (DILACOR XR) 240 MG 24 hr capsule Take 1 capsule (240 mg total) by mouth daily. 30 capsule 11   No current facility-administered medications on file prior to visit.    Review of Systems Constitutional: Negative for other unusual diaphoresis, sweats, appetite or weight changes HENT: Negative for other worsening hearing loss, ear pain, facial swelling, mouth sores or neck stiffness.   Eyes: Negative for other worsening pain, redness or other visual disturbance.  Respiratory: Negative for other stridor or swelling Cardiovascular: Negative for other palpitations or other chest pain  Gastrointestinal: Negative for worsening diarrhea or loose stools, blood in stool, distention or other pain Genitourinary: Negative for hematuria, flank pain or other change in urine volume.  Musculoskeletal: Negative for myalgias or other joint swelling.  Skin: Negative for other color change,  or other wound or worsening drainage.  Neurological: Negative for other syncope or numbness. Hematological: Negative for other adenopathy or swelling Psychiatric/Behavioral: Negative for hallucinations, other worsening agitation, SI, self-injury, or new decreased concentration All other system neg per pt    Objective:   Physical Exam BP 102/64   Pulse 60   Ht 5\' 9"  (1.753 m)   Wt 173 lb (78.5 kg)   SpO2 98%   BMI 25.55 kg/m  VS noted, not ill  appearing, somewhat slowed in movements compared to previous Constitutional: Pt is oriented to person, place, and time. Appears well-developed and well-nourished, in no significant distress and comfortable Head: Normocephalic and atraumatic  Eyes: Conjunctivae and EOM are normal. Pupils are equal, round, and reactive to light Right Ear: External ear normal without discharge Left Ear: External ear normal without discharge Nose: Nose without discharge or deformity Mouth/Throat: Oropharynx is without other ulcerations and moist  Neck: Normal range of motion. Neck supple. No JVD present. No tracheal deviation present or significant neck LA or mass Cardiovascular: Normal rate, regular rhythm, normal heart sounds and intact distal pulses.   Pulmonary/Chest: WOB normal and breath sounds without rales or wheezing  Abdominal: Soft. Bowel sounds are normal. NT. No HSM  Musculoskeletal: Normal range of motion. Exhibits no edema Lymphadenopathy: Has no other cervical adenopathy.  Neurological: Pt is alert and oriented to person, place, and time. Pt has normal reflexes. No cranial nerve deficit. Motor grossly intact, Gait intact Skin: Skin is warm and dry. No rash noted or new ulcerations Psychiatric:  Has normal mood and affect. Behavior is normal without agitation No other exam findings  Lab Results  Component Value Date   WBC 3.4 (L) 04/17/2017   HGB 11.6 (L) 04/17/2017   HCT 35.9 (L) 04/17/2017   PLT 99 (L) 04/17/2017   GLUCOSE 92 04/17/2017   CHOL 149 12/07/2016   TRIG 82.0 12/07/2016   HDL 57.50 12/07/2016   LDLCALC 75 12/07/2016   ALT <6 04/17/2017   AST 12 04/17/2017   NA 144 04/17/2017   K 4.5 04/17/2017   CL 111 03/25/2017   CREATININE 1.8 (H) 04/17/2017   BUN 18.8 04/17/2017   CO2 22 04/17/2017   TSH 3.53 06/05/2016   PSA 0.63 05/12/2010      Assessment & Plan:

## 2017-06-06 NOTE — Assessment & Plan Note (Signed)
stable overall by history and exam, recent data reviewed with pt, and pt to continue medical treatment as before,  to f/u any worsening symptoms or concerns BP Readings from Last 3 Encounters:  06/06/17 102/64  04/24/17 114/66  04/17/17 (!) 115/54

## 2017-06-06 NOTE — Assessment & Plan Note (Signed)

## 2017-06-06 NOTE — Assessment & Plan Note (Signed)
To f/u with heme as planned

## 2017-06-18 NOTE — Addendum Note (Signed)
Addended by: Biagio Borg on: 06/18/2017 12:10 PM   Modules accepted: Level of Service

## 2017-06-19 ENCOUNTER — Other Ambulatory Visit: Payer: Self-pay | Admitting: Internal Medicine

## 2017-08-07 ENCOUNTER — Other Ambulatory Visit: Payer: Self-pay | Admitting: Internal Medicine

## 2017-08-16 ENCOUNTER — Other Ambulatory Visit (HOSPITAL_BASED_OUTPATIENT_CLINIC_OR_DEPARTMENT_OTHER): Payer: Medicare Other

## 2017-08-16 ENCOUNTER — Ambulatory Visit (HOSPITAL_BASED_OUTPATIENT_CLINIC_OR_DEPARTMENT_OTHER): Payer: Medicare Other | Admitting: Hematology

## 2017-08-16 ENCOUNTER — Encounter: Payer: Self-pay | Admitting: Hematology

## 2017-08-16 ENCOUNTER — Telehealth: Payer: Self-pay

## 2017-08-16 VITALS — BP 136/63 | HR 45 | Temp 98.1°F | Resp 20 | Ht 69.0 in | Wt 168.0 lb

## 2017-08-16 DIAGNOSIS — D472 Monoclonal gammopathy: Secondary | ICD-10-CM | POA: Diagnosis not present

## 2017-08-16 DIAGNOSIS — D696 Thrombocytopenia, unspecified: Secondary | ICD-10-CM

## 2017-08-16 DIAGNOSIS — D649 Anemia, unspecified: Secondary | ICD-10-CM

## 2017-08-16 DIAGNOSIS — D72819 Decreased white blood cell count, unspecified: Secondary | ICD-10-CM

## 2017-08-16 DIAGNOSIS — N189 Chronic kidney disease, unspecified: Secondary | ICD-10-CM | POA: Diagnosis not present

## 2017-08-16 LAB — COMPREHENSIVE METABOLIC PANEL
ALBUMIN: 3.8 g/dL (ref 3.5–5.0)
ALK PHOS: 47 U/L (ref 40–150)
AST: 13 U/L (ref 5–34)
Anion Gap: 9 mEq/L (ref 3–11)
BUN: 29 mg/dL — AB (ref 7.0–26.0)
CALCIUM: 9 mg/dL (ref 8.4–10.4)
CO2: 18 mEq/L — ABNORMAL LOW (ref 22–29)
CREATININE: 1.9 mg/dL — AB (ref 0.7–1.3)
Chloride: 114 mEq/L — ABNORMAL HIGH (ref 98–109)
EGFR: 36 mL/min/{1.73_m2} — ABNORMAL LOW (ref >60)
GLUCOSE: 87 mg/dL (ref 70–140)
POTASSIUM: 4.4 meq/L (ref 3.5–5.1)
SODIUM: 142 meq/L (ref 136–145)
TOTAL PROTEIN: 7.3 g/dL (ref 6.4–8.3)
Total Bilirubin: 0.5 mg/dL (ref 0.20–1.20)

## 2017-08-16 LAB — CBC & DIFF AND RETIC
BASO%: 0.9 % (ref 0.0–2.0)
BASOS ABS: 0 10*3/uL (ref 0.0–0.1)
EOS ABS: 0.1 10*3/uL (ref 0.0–0.5)
EOS%: 2.8 % (ref 0.0–7.0)
HEMATOCRIT: 36.6 % — AB (ref 38.4–49.9)
HEMOGLOBIN: 11.9 g/dL — AB (ref 13.0–17.1)
IMMATURE RETIC FRACT: 5 % (ref 3.00–10.60)
LYMPH#: 1.3 10*3/uL (ref 0.9–3.3)
LYMPH%: 41.3 % (ref 14.0–49.0)
MCH: 30.5 pg (ref 27.2–33.4)
MCHC: 32.5 g/dL (ref 32.0–36.0)
MCV: 93.8 fL (ref 79.3–98.0)
MONO#: 0.5 10*3/uL (ref 0.1–0.9)
MONO%: 14.4 % — AB (ref 0.0–14.0)
NEUT%: 40.6 % (ref 39.0–75.0)
NEUTROS ABS: 1.3 10*3/uL — AB (ref 1.5–6.5)
Platelets: 82 10*3/uL — ABNORMAL LOW (ref 140–400)
RBC: 3.9 10*6/uL — ABNORMAL LOW (ref 4.20–5.82)
RDW: 15 % — AB (ref 11.0–14.6)
RETIC %: 0.7 % — AB (ref 0.80–1.80)
RETIC CT ABS: 27.3 10*3/uL — AB (ref 34.80–93.90)
WBC: 3.2 10*3/uL — AB (ref 4.0–10.3)

## 2017-08-16 NOTE — Telephone Encounter (Signed)
Printed avs and calender for upcoming appointment per 10/12 los.

## 2017-08-16 NOTE — Progress Notes (Signed)
[name]    HEMATOLOGY/ONCOLOGY CONSULTATION NOTE  Date of Service: 08/16/2017  Patient Care Team: Biagio Borg, MD as PCP - General  CHIEF COMPLAINTS/PURPOSE OF CONSULTATION:  Abnormal SPEP  HISTORY OF PRESENTING ILLNESS:   Devin Hogan is a wonderful 83 y.o. male who has been referred to [place] by Dr Jenny Reichmann, Hunt Oris, MDDr Hollie Salk for evaluation and management of monoclonal paraproteinemia.  Patient has a history of hypertension, dyslipidemia, gout, BPH chronic kidney disease with a baseline creatinine of 1.6-1.8 until February 2017 and creatinine of 1.99 in February 2018. He was seen by Dr. Hollie Salk and had a renal ultrasound on 12/13/2016 that did not show any obstructive uropathy from his BPH.  As a part of the workup for his chronic kidney disease a serum protein electrophoresis was done which showed an IgG level of 1500 IgA level of 178 IgM of 41 with immunofixation electrophoresis showing IgG kappa monoclonal antibody. M spike was newly elevated at 0.6 g/dL. Patient has serum free light chains done which showed some increase in kappa free light chains with A kappa lambda ratio of about 3 (62.8 : 20.8).  Labs on 02/05/2017 showed a creatinine of 1.94 with no hypercalcemia. CBC from that time was not available. Hysterectomy and has been 1.7 since January 2014 and today in clinic is 1.8. This suggest ability and his renal function over the last 4-1/2 years. This suggests chronic kidney disease likely due to hypertension and less likely related to multiple myeloma or AL Amyloidosis  Patient was referred to [place] for further evaluation of his monoclonal paraproteinemia.  Patient notes no fevers no chills no night sweats. Notes no focal bone pains. Labs done today show only some mild anemia with a hemoglobin of 11.6. He has some mild chronic leukopenia with WBC count between 2.8 and 3.7k since July 2014. ? Benign ethnic neutropenia. Also has had some mild thrombocytopenia in the 99-105 k range during the same  time. From July 2014.  Patient notes that he feels no differently now that he felt 6 months or year ago. No new fatigue.  INTERIM HISTORY:  TREVIOUS RAMPEY returns for routine follow up. Overall he has been doing well since we last saw him. He reports some shortness of breath, but this has been intermittent and manageable. This has not limited him from his daily physical activities and he does not have to pause to catch his breath. He denies any recent bleeding/bruising issues, bloody stools or any other associated symptoms. He has not recently experienced any infections or underwent antibiotic therapies. No recent new fatigue or overt tiredness.    MEDICAL HISTORY:  Past Medical History:  Diagnosis Date  . Acute gouty arthropathy 12/13/2010  . ALLERGIC RHINITIS 05/22/2007  . BENIGN PROSTATIC HYPERTROPHY 05/22/2007  . COLONIC POLYPS, HX OF 11/19/2007  . ERECTILE DYSFUNCTION 05/22/2007  . Gout 05/17/2011  . HYPERLIPIDEMIA 05/22/2007  . HYPERTENSION 05/22/2007  . RENAL INSUFFICIENCY 11/18/2009  . SKIN LESION 11/16/2010    SURGICAL HISTORY: No past surgical history on file.  SOCIAL HISTORY: Social History   Social History  . Marital status: Married    Spouse name: N/A  . Number of children: 5  . Years of education: N/A   Occupational History  . retired Clinical cytogeneticist Retired   Social History Main Topics  . Smoking status: Former Research scientist (life sciences)  . Smokeless tobacco: Never Used     Comment: quit when he was very yourg  . Alcohol use 10.2 oz/week  4 Cans of beer, 1 Glasses of wine, 12 Shots of liquor per week     Comment: "couple of drinks per day" takes drink on the weekend  . Drug use: No  . Sexual activity: Not on file   Other Topics Concern  . Not on file   Social History Narrative  . No narrative on file    FAMILY HISTORY: Family History  Problem Relation Age of Onset  . Lung cancer Brother   . Stroke Sister     ALLERGIES:  has No Known Allergies.  MEDICATIONS:  Current  Outpatient Prescriptions  Medication Sig Dispense Refill  . aspirin 81 MG EC tablet Take 81 mg by mouth daily.      [name] atenolol (TENORMIN) 25 MG tablet Take 1 tablet (25 mg total) by mouth daily. 90 tablet 3  . CARTIA XT 240 MG 24 hr capsule TAKE 1 CAPSULE BY MOUTH ONCE DAILY 90 capsule 3  . colchicine 0.6 MG tablet Take 1 tablet by mouth at the onset of flair (0.6 mg) then 1/2 tablet (0.3 mg) 1 hour later - do not repeat for 3 days. 2 tablet 0  . Hypromellose (ARTIFICIAL TEARS OP) Place 1 drop into both eyes daily as needed (dry eyes).    [name] lovastatin (MEVACOR) 20 MG tablet TAKE 1 TABLET BY MOUTH ONCE DAILY 90 tablet 3   No current facility-administered medications for this visit.     REVIEW OF SYSTEMS:    A 10+ POINT REVIEW OF SYSTEMS WAS OBTAINED including neurology, dermatology, psychiatry, cardiac, respiratory, lymph, extremities, GI, GU, Musculoskeletal, constitutional, breasts, reproductive, HEENT.  All pertinent positives are noted in the HPI.  All others are negative.  PHYSICAL EXAMINATION: ECOG PERFORMANCE STATUS: 1 - Symptomatic but completely ambulatory  . There were no vitals filed for this visit. There were no vitals filed for this visit. .There is no height or weight on file to calculate BMI.  GENERAL:alert, in no acute distress and comfortable SKIN: no acute rashes, no significant lesions EYES: conjunctiva are pink and non-injected, sclera anicteric OROPHARYNX: MMM, no exudates, no oropharyngeal erythema or ulceration NECK: supple, no JVD LYMPH:  no palpable lymphadenopathy in the cervical, axillary or inguinal regions LUNGS: clear to auscultation b/l with normal respiratory effort HEART: regular rate & rhythm ABDOMEN:  normoactive bowel sounds , non tender, not distended. Extremity: no pedal edema PSYCH: alert & oriented x 3 with fluent speech NEURO: no focal motor/sensory deficits  LABORATORY DATA:  I have reviewed the data as listed  . CBC Latest Ref Rng &  Units 08/16/2017 04/17/2017 03/25/2017  WBC 4.0 - 10.3 10e3/uL 3.2(L) 3.4(L) 2.9(L)  Hemoglobin 13.0 - 17.1 g/dL 11.9(L) 11.6(L) 12.3(L)  Hematocrit 38.4 - 49.9 % 36.6(L) 35.9(L) 38.0(L)  Platelets 140 - 400 10e3/uL 82(L) 99(L) 91(L)    CMP Latest Ref Rng & Units 08/16/2017 08/16/2017 04/17/2017  Glucose 70 - 140 mg/dl 87 - 92  BUN 7.0 - 26.0 mg/dL 29.0(H) - 18.8  Creatinine 0.7 - 1.3 mg/dL 1.9(H) - 1.8(H)  Sodium 136 - 145 mEq/L 142 - 144  Potassium 3.5 - 5.1 mEq/L 4.4 - 4.5  Chloride 101 - 111 mmol/L - - -  CO2 22 - 29 mEq/L 18(L) - 22  Calcium 8.4 - 10.4 mg/dL 9.0 - 8.9  Total Protein 6.4 - 8.3 g/dL 7.3 6.8 6.9  Total Bilirubin 0.20 - 1.20 mg/dL 0.50 - 0.32  Alkaline Phos 40 - 150 U/L 47 - 50  AST 5 - 34 U/L  13 - 12  ALT 0-55 U/L U/L <6 - <6          RADIOGRAPHIC STUDIES: I have personally reviewed the radiological images as listed and agreed with the findings in the report. No results found.  ASSESSMENT & PLAN:   83 year old African-American male with  #1 IgG kappa monoclonal gammopathy of undetermined significance. Patient has a minor M spike of 0.6 g/dL. Borderline elevated and abnormal serum kappa lambda free light chains and ratio. This is partly due to decreased light chain clearance due to chronic kidney disease. Patient has mildly elevated creatinine which has been relatively stable since 2014 in the 1.6-1.8 creatinine range. No hypercalcemia. 24-hour urine protein shows no monoclonal protein and minimal proteinuria suggestive more of hypertensive nephropathy. Borderline anemia at that does not meet criteria for multiple myeloma and is likely related to chronic kidney disease. Whole body skeletal survey shows no bony lesions to suggest active myeloma.  Plan -Overall lab and radiographic picture is suggestive of monoclonal gammopathy of undetermined significance. -At this point I do not feel the patient's chronic kidney disease is related to multiple myeloma or  AL Amyloidosis. -rpt Myeloma panel shows unchange M spike of 0.6g/dl and stable SFLC's -Pros and cons of bone marrow biopsy or definitive diagnosis were discussed with the patient and he prefers to hold off on this currently unless any new other concerning symptoms or features arise. I also do not necessarily recommend this as the suspicion for a plasma cell tumor is very low, and due to the invasiveness of the procedure I informed him I would like to opt for surveillance. -I also discussed with him that slow-growing lymphomas can cause his conditions, but this would be an atypical presentation as he is overall remained asymptomatic.  -We will see him back in 75mo if he remains overall asymptomatic and his counts remain stable then we will discharge him back to his PCP for monitoring and only f/u with Hem/Onc prn.   #2 borderline normocytic normochromic anemia with a hemoglobin of 11.9 (improved from 11.6). Likely related to chronic kidney disease. Plan -Continue follow-up for anemia  -Reasonable to consider oral iron replacement to maintain ferritin levels all the 100 in the setting of chronic kidney disease.  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 This has been mild and his neutropenia has been borderline. 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 -No indication for G-CSF at this time. -Monitor.  #4 mild thrombocytopenia -No indication for intervention at this time. Monitor.   RTC with Dr KIrene Limboin 6 months with labs  All of the patients questions were answered with apparent satisfaction. The patient knows to call the clinic with any problems, questions or concerns.  I spent 20 minutes counseling the patient face to face. The total time spent in the appointment was 25 minutes and more than 50% was on counseling and direct patient cares.  GSullivan LoneMD MFalls CreekAAHIVMS SKettering Medical CenterCLegent Hospital For Special SurgeryHematology/Oncology Physician [facility] (Office):        [phone](Work cell):  [phone](Fax):           [phone] 08/16/2017 8:40 AM  This document serves as a record of services personally performed by GSullivan Lone MD. It was created on his behalf by WReola Mosher a trained medical scribe. The creation of this record is based on the scribe's personal observations and the provider's statements to them. This document has been checked and approved by the  attending provider.

## 2017-08-19 LAB — KAPPA/LAMBDA LIGHT CHAINS
Ig Kappa Free Light Chain: 60.4 mg/L — ABNORMAL HIGH (ref 3.3–19.4)
Ig Lambda Free Light Chain: 23 mg/L (ref 5.7–26.3)
Kappa/Lambda FluidC Ratio: 2.63 — ABNORMAL HIGH (ref 0.26–1.65)

## 2017-08-21 LAB — MULTIPLE MYELOMA PANEL, SERUM
ALBUMIN SERPL ELPH-MCNC: 3.5 g/dL (ref 2.9–4.4)
ALBUMIN/GLOB SERPL: 1.1 (ref 0.7–1.7)
ALPHA 1: 0.2 g/dL (ref 0.0–0.4)
ALPHA2 GLOB SERPL ELPH-MCNC: 0.7 g/dL (ref 0.4–1.0)
B-Globulin SerPl Elph-Mcnc: 1.8 g/dL — ABNORMAL HIGH (ref 0.7–1.3)
Gamma Glob SerPl Elph-Mcnc: 0.6 g/dL (ref 0.4–1.8)
Globulin, Total: 3.3 g/dL (ref 2.2–3.9)
IGM (IMMUNOGLOBIN M), SRM: 38 mg/dL (ref 15–143)
IgA, Qn, Serum: 177 mg/dL (ref 61–437)
M Protein SerPl Elph-Mcnc: 0.6 g/dL — ABNORMAL HIGH
Total Protein: 6.8 g/dL (ref 6.0–8.5)

## 2017-12-11 ENCOUNTER — Ambulatory Visit: Payer: Medicare Other | Admitting: Internal Medicine

## 2017-12-11 ENCOUNTER — Ambulatory Visit (INDEPENDENT_AMBULATORY_CARE_PROVIDER_SITE_OTHER)
Admission: RE | Admit: 2017-12-11 | Discharge: 2017-12-11 | Disposition: A | Discharge status: Home / Self Care | Payer: Medicare Other | Source: Ambulatory Visit | Attending: Internal Medicine | Admitting: Internal Medicine

## 2017-12-11 ENCOUNTER — Encounter: Payer: Self-pay | Admitting: Internal Medicine

## 2017-12-11 VITALS — BP 114/68 | HR 64 | Temp 97.6°F | Ht 69.0 in | Wt 168.0 lb

## 2017-12-11 DIAGNOSIS — Z23 Encounter for immunization: Secondary | ICD-10-CM

## 2017-12-11 DIAGNOSIS — W19XXXD Unspecified fall, subsequent encounter: Secondary | ICD-10-CM

## 2017-12-11 DIAGNOSIS — N189 Chronic kidney disease, unspecified: Secondary | ICD-10-CM | POA: Diagnosis not present

## 2017-12-11 DIAGNOSIS — R05 Cough: Secondary | ICD-10-CM

## 2017-12-11 DIAGNOSIS — Y92009 Unspecified place in unspecified non-institutional (private) residence as the place of occurrence of the external cause: Secondary | ICD-10-CM | POA: Insufficient documentation

## 2017-12-11 DIAGNOSIS — I10 Essential (primary) hypertension: Secondary | ICD-10-CM

## 2017-12-11 DIAGNOSIS — W19XXXA Unspecified fall, initial encounter: Secondary | ICD-10-CM | POA: Insufficient documentation

## 2017-12-11 DIAGNOSIS — R059 Cough, unspecified: Secondary | ICD-10-CM | POA: Insufficient documentation

## 2017-12-11 MED ORDER — HYDROCODONE-HOMATROPINE 5-1.5 MG/5ML PO SYRP: 5.0000 mL | ORAL_SOLUTION | Freq: Four times a day (QID) | ORAL | 0 refills | Status: AC | PRN

## 2017-12-11 MED ORDER — HYDROCODONE-HOMATROPINE 5-1.5 MG/5ML PO SYRP: 5.0000 mL | ORAL_SOLUTION | Freq: Four times a day (QID) | ORAL | 0 refills | Status: DC | PRN

## 2017-12-11 MED ORDER — CEFTRIAXONE SODIUM 1 G IJ SOLR
1.0000 g | Freq: Once | INTRAMUSCULAR | Status: AC
  Administered 2017-12-11: 1 g via INTRAMUSCULAR

## 2017-12-11 MED ORDER — LEVOFLOXACIN 250 MG PO TABS
250.0000 mg | ORAL_TABLET | Freq: Every day | ORAL | 0 refills | Status: AC
Start: 2017-12-11 — End: 2017-12-21

## 2017-12-11 MED ORDER — ALBUTEROL SULFATE HFA 108 (90 BASE) MCG/ACT IN AERS: 2.0000 | INHALATION_SPRAY | Freq: Four times a day (QID) | RESPIRATORY_TRACT | 1 refills | Status: DC | PRN

## 2017-12-11 NOTE — Progress Notes (Signed)
Subjective:    Patient ID: Devin Hogan, male    DOB: 1927-12-02, 84 y.o.   MRN: [id number]  HPI Here with acute onset mild to mod 2-3 days ST, HA, general weakness and malaise, with prod cough greenish sputum, but Pt denies chest pain, increased sob or doe, wheezing, orthopnea, PND, increased LE swelling, palpitations, dizziness or syncope, except for onset mild wheezing and sob since last PM.  Also now walking with cane for support for a few months, new for him, started after a fall without injury a several months ago.  Due for flu shot.     Past Medical History:  Diagnosis Date  . Acute gouty arthropathy 12/13/2010  . ALLERGIC RHINITIS 05/22/2007  . BENIGN PROSTATIC HYPERTROPHY 05/22/2007  . COLONIC POLYPS, HX OF 11/19/2007  . ERECTILE DYSFUNCTION 05/22/2007  . Gout 05/17/2011  . HYPERLIPIDEMIA 05/22/2007  . HYPERTENSION 05/22/2007  . RENAL INSUFFICIENCY 11/18/2009  . SKIN LESION 11/16/2010   No past surgical history on file.  reports that he has quit smoking. he has never used smokeless tobacco. He reports that he drinks about 10.2 oz of alcohol per week. He reports that he does not use drugs. family history includes Lung cancer in his brother; Stroke in his sister. No Known Allergies Current Outpatient Medications on File Prior to Visit  Medication Sig Dispense Refill  . aspirin 81 MG EC tablet Take 81 mg by mouth daily.      [name] atenolol (TENORMIN) 25 MG tablet Take 1 tablet (25 mg total) by mouth daily. 90 tablet 3  . CARTIA XT 240 MG 24 hr capsule TAKE 1 CAPSULE BY MOUTH ONCE DAILY 90 capsule 3  . colchicine 0.6 MG tablet Take 1 tablet by mouth at the onset of flair (0.6 mg) then 1/2 tablet (0.3 mg) 1 hour later - do not repeat for 3 days. 2 tablet 0  . Hypromellose (ARTIFICIAL TEARS OP) Place 1 drop into both eyes daily as needed (dry eyes).    [name] lovastatin (MEVACOR) 20 MG tablet TAKE 1 TABLET BY MOUTH ONCE DAILY 90 tablet 3  . [DISCONTINUED] diltiazem (DILACOR XR) 240 MG 24 hr capsule  Take 1 capsule (240 mg total) by mouth daily. 30 capsule 11   No current facility-administered medications on file prior to visit.    Review of Systems  Constitutional: Negative for other unusual diaphoresis or sweats HENT: Negative for ear discharge or swelling Eyes: Negative for other worsening visual disturbances Respiratory: Negative for stridor or other swelling  Gastrointestinal: Negative for worsening distension or other blood Genitourinary: Negative for retention or other urinary change Musculoskeletal: Negative for other MSK pain or swelling Skin: Negative for color change or other new lesions Neurological: Negative for worsening tremors and other numbness  Psychiatric/Behavioral: Negative for worsening agitation or other fatigue All other system neg per pt    Objective:   Physical Exam BP 114/68   Pulse 64   Temp 97.6 F (36.4 C) (Oral)   Ht 5\' 9"  (1.753 m)   Wt 168 lb (76.2 kg)   SpO2 97%   BMI 24.81 kg/m  VS noted, mild ill Constitutional: Pt appears in NAD HENT: Head: NCAT.  Right Ear: External ear normal.  Left Ear: External ear normal.  Eyes: . Pupils are equal, round, and reactive to light. Conjunctivae and EOM are normal Bilat tm's with mild erythema.  Max sinus areas mild tender.  Pharynx with mild erythema, no exudate Nose: without d/c or deformity Neck: Neck supple.  Gross normal ROM Cardiovascular: Normal rate and regular rhythm.   Pulmonary/Chest: Effort normal and breath sounds without rales but with rhonchi and scattered wheezing.  Neurological: Pt is alert. At baseline orientation, motor grossly intact Skin: Skin is warm. No rashes, other new lesions, no LE edema Psychiatric: Pt behavior is normal without agitation  No other exam findings  Lab Results  Component Value Date   WBC 3.2 (L) 08/16/2017   HGB 11.9 (L) 08/16/2017   HCT 36.6 (L) 08/16/2017   PLT 82 (L) 08/16/2017   GLUCOSE 87 08/16/2017   CHOL 134 06/06/2017   TRIG 60.0 06/06/2017     HDL 51.00 06/06/2017   LDLCALC 71 06/06/2017   ALT <6 08/16/2017   AST 13 08/16/2017   NA 142 08/16/2017   K 4.4 08/16/2017   CL 111 03/25/2017   CREATININE 1.9 (H) 08/16/2017   BUN 29.0 (H) 08/16/2017   CO2 18 (L) 08/16/2017   TSH 3.65 06/06/2017   PSA 0.63 05/12/2010        Assessment & Plan:

## 2017-12-11 NOTE — Patient Instructions (Signed)
You had the antibiotic shot today (rocephin)  Please take all new medication as prescribed - the antibiotic, and cough medicine, and the inhaler if needed  Please continue all other medications as before, and refills have been done if requested.  Please have the pharmacy call with any other refills you may need.  Please keep your appointments with your specialists as you may have planned  Please go to the XRAY Department in the Basement (go straight as you get off the elevator) for the x-ray testing  You will be contacted by phone if any changes need to be made immediately.  Otherwise, you will receive a letter about your results with an explanation, but please check with MyChart first.  Please remember to sign up for MyChart if you have not done so, as this will be important to you in the future with finding out test results, communicating by private email, and scheduling acute appointments online when needed.

## 2017-12-15 ENCOUNTER — Encounter: Payer: Self-pay | Admitting: Internal Medicine

## 2017-12-15 NOTE — Assessment & Plan Note (Signed)
stable overall by history and exam, recent data reviewed with pt, and pt to continue medical treatment as before,  to f/u any worsening symptoms or concerns BP Readings from Last 3 Encounters:  12/11/17 114/68  08/16/17 136/63  06/06/17 102/64

## 2017-12-15 NOTE — Assessment & Plan Note (Signed)
To cont cane, declines referral to PT

## 2017-12-15 NOTE — Assessment & Plan Note (Signed)
stable overall by history and exam, recent data reviewed with pt, and pt to continue medical treatment as before,  to f/u any worsening symptoms or concerns Lab Results  Component Value Date   CREATININE 1.9 (H) 08/16/2017

## 2017-12-15 NOTE — Assessment & Plan Note (Addendum)
Mild to mod, c/w bronchitis vs pna, for cxr,  for rocephin IM x 1, antibx course, cough med prn, inhaler prn,  to f/u any worsening symptoms or concerns

## 2018-02-13 NOTE — Progress Notes (Signed)
[name]    HEMATOLOGY/ONCOLOGY CONSULTATION NOTE  Date of Service: 02/14/18  Patient Care Team: Biagio Borg, MD as PCP - General  CHIEF COMPLAINTS/PURPOSE OF CONSULTATION:  F/u for MGUS  HISTORY OF PRESENTING ILLNESS:   Devin Hogan is a wonderful 84 y.o. male who has been referred to [place] by Dr Jenny Reichmann, Hunt Oris, MDDr Hollie Salk for evaluation and management of monoclonal paraproteinemia.  Patient has a history of hypertension, dyslipidemia, gout, BPH chronic kidney disease with a baseline creatinine of 1.6-1.8 until February 2017 and creatinine of 1.99 in February 2018. He was seen by Dr. Hollie Salk and had a renal ultrasound on 12/13/2016 that did not show any obstructive uropathy from his BPH.  As a part of the workup for his chronic kidney disease a serum protein electrophoresis was done which showed an IgG level of 1500 IgA level of 178 IgM of 41 with immunofixation electrophoresis showing IgG kappa monoclonal antibody. M spike was newly elevated at 0.6 g/dL. Patient has serum free light chains done which showed some increase in kappa free light chains with A kappa lambda ratio of about 3 (62.8 : 20.8).  Labs on 02/05/2017 showed a creatinine of 1.94 with no hypercalcemia. CBC from that time was not available. Hysterectomy and has been 1.7 since January 2014 and today in clinic is 1.8. This suggest ability and his renal function over the last 4-1/2 years. This suggests chronic kidney disease likely due to hypertension and less likely related to multiple myeloma or AL Amyloidosis  Patient was referred to [place] for further evaluation of his monoclonal paraproteinemia.  Patient notes no fevers no chills no night sweats. Notes no focal bone pains. Labs done today show only some mild anemia with a hemoglobin of 11.6. He has some mild chronic leukopenia with WBC count between 2.8 and 3.7k since July 2014. ? Benign ethnic neutropenia. Also has had some mild thrombocytopenia in the 99-105 k range during the same  time. From July 2014.  Patient notes that he feels no differently now that he felt 6 months or year ago. No new fatigue.  INTERIM HISTORY:   [name] returns for routine follow up regarding his IgG kappa MGUS. The patient's last visit with [place] was on 08/16/17. He is accompanied today by his wife and son. The pt reports that he is doing well overall.   The pt reports that he has no new concerns. He continues to see [employer]. He continues to see his primary Dr. Cathlean Cower every 6 months.   Lab results today (02/14/18) of CBC, and Reticulocytes is as follows: all values are WNL except for WBC at 3.1k, RBC at 3.55, Hgb at 10.9, HCT at 33.9, RDW at 15.2, Platelets at 79k, Neutro Abs at 1.3k, Retic Ct Abs at 32.0. MMP 02/14/18  Some increase in M spike to 0.9g/dl from0.6g/dl .   On review of systems, pt reports good energy levels, mild ankle swelling, and denies new bone pains, bleeding, abdominal pains, back pain, pain along the spine, and any other symptoms.    MEDICAL HISTORY:  Past Medical History:  Diagnosis Date  . Acute gouty arthropathy 12/13/2010  . ALLERGIC RHINITIS 05/22/2007  . BENIGN PROSTATIC HYPERTROPHY 05/22/2007  . COLONIC POLYPS, HX OF 11/19/2007  . ERECTILE DYSFUNCTION 05/22/2007  . Gout 05/17/2011  . HYPERLIPIDEMIA 05/22/2007  . HYPERTENSION 05/22/2007  . RENAL INSUFFICIENCY 11/18/2009  . SKIN LESION 11/16/2010    SURGICAL HISTORY: No past surgical history on file.  SOCIAL HISTORY: Social History   Socioeconomic History  . Marital status: Married    Spouse name: Not on file  . Number of children: 5  . Years of education: Not on file  . Highest education level: Not on file  Occupational History  . Occupation: retired Research scientist (medical): RETIRED  Social Needs  . Financial resource strain: Not on file  . Food insecurity:    Worry: Not on file    Inability: Not on file  . Transportation needs:    Medical: Not on file    Non-medical: Not on  file  Tobacco Use  . Smoking status: Former Research scientist (life sciences)  . Smokeless tobacco: Never Used  . Tobacco comment: quit when he was very yourg  Substance and Sexual Activity  . Alcohol use: Yes    Alcohol/week: 10.2 oz    Types: 4 Cans of beer, 1 Glasses of wine, 12 Shots of liquor per week    Comment: "couple of drinks per day" takes drink on the weekend  . Drug use: No  . Sexual activity: Not on file  Lifestyle  . Physical activity:    Days per week: Not on file    Minutes per session: Not on file  . Stress: Not on file  Relationships  . Social connections:    Talks on phone: Not on file    Gets together: Not on file    Attends religious service: Not on file    Active member of club or organization: Not on file    Attends meetings of clubs or organizations: Not on file    Relationship status: Not on file  . Intimate partner violence:    Fear of current or ex partner: Not on file    Emotionally abused: Not on file    Physically abused: Not on file    Forced sexual activity: Not on file  Other Topics Concern  . Not on file  Social History Narrative  . Not on file    FAMILY HISTORY: Family History  Problem Relation Age of Onset  . Lung cancer Brother   . Stroke Sister     ALLERGIES:  has No Known Allergies.  MEDICATIONS:  Current Outpatient Medications  Medication Sig Dispense Refill  . albuterol (PROVENTIL HFA;VENTOLIN HFA) 108 (90 Base) MCG/ACT inhaler Inhale 2 puffs into the lungs every 6 (six) hours as needed for wheezing or shortness of breath. 1 Inhaler 1  . aspirin 81 MG EC tablet Take 81 mg by mouth daily.      [name] atenolol (TENORMIN) 25 MG tablet Take 1 tablet (25 mg total) by mouth daily. 90 tablet 3  . CARTIA XT 240 MG 24 hr capsule TAKE 1 CAPSULE BY MOUTH ONCE DAILY 90 capsule 3  . colchicine 0.6 MG tablet Take 1 tablet by mouth at the onset of flair (0.6 mg) then 1/2 tablet (0.3 mg) 1 hour later - do not repeat for 3 days. 2 tablet 0  . Hypromellose (ARTIFICIAL  TEARS OP) Place 1 drop into both eyes daily as needed (dry eyes).    [name] lovastatin (MEVACOR) 20 MG tablet TAKE 1 TABLET BY MOUTH ONCE DAILY 90 tablet 3   No current facility-administered medications for this visit.     REVIEW OF SYSTEMS:    10 Point review of Systems was done is negative except as noted above.  PHYSICAL EXAMINATION: ECOG PERFORMANCE STATUS: 1 - Symptomatic but completely ambulatory  . Vitals:   02/14/18 0930  BP: (!) 141/63  Pulse: 86  Resp: 20  Temp: 97.8 F (36.6 C)  SpO2: 98%   Filed Weights   02/14/18 0930  Weight: 165 lb 11.2 oz (75.2 kg)   .Body mass index is 24.47 kg/m.  GENERAL:alert, in no acute distress and comfortable SKIN: no acute rashes, no significant lesions EYES: conjunctiva are pink and non-injected, sclera anicteric OROPHARYNX: MMM, no exudates, no oropharyngeal erythema or ulceration NECK: supple, no JVD LYMPH:  no palpable lymphadenopathy in the cervical, axillary or inguinal regions LUNGS: clear to auscultation b/l with normal respiratory effort HEART: regular rate & rhythm ABDOMEN:  normoactive bowel sounds , non tender, not distended. Extremity: no pedal edema PSYCH: alert & oriented x 3 with fluent speech NEURO: no focal motor/sensory deficits   LABORATORY DATA:  I have reviewed the data as listed  . CBC Latest Ref Rng & Units 02/14/2018 08/16/2017 04/17/2017  WBC 4.0 - 10.3 K/uL 3.1(L) 3.2(L) 3.4(L)  Hemoglobin 13.0 - 17.1 g/dL 10.9(L) 11.9(L) 11.6(L)  Hematocrit 38.4 - 49.9 % 33.9(L) 36.6(L) 35.9(L)  Platelets 140 - 400 K/uL 79(L) 82(L) 99(L)    CMP Latest Ref Rng & Units 02/14/2018 08/16/2017 08/16/2017  Glucose 70 - 140 mg/dL 84 87 -  BUN 7 - 26 mg/dL 24 29.0(H) -  Creatinine 0.70 - 1.30 mg/dL 2.08(H) 1.9(H) -  Sodium 136 - 145 mmol/L 142 142 -  Potassium 3.5 - 5.1 mmol/L 4.2 4.4 -  Chloride 98 - 109 mmol/L 113(H) - -  CO2 22 - 29 mmol/L 21(L) 18(L) -  Calcium 8.4 - 10.4 mg/dL 8.7 9.0 -  Total Protein 6.4 - 8.3  g/dL 7.0 7.3 6.8  Total Bilirubin 0.2 - 1.2 mg/dL 0.4 0.50 -  Alkaline Phos 40 - 150 U/L 52 47 -  AST 5 - 34 U/L 12 13 -  ALT 0 - 55 U/L <6 <6 -         Component     Latest Ref Rng & Units 08/16/2017 02/14/2018  Ig Kappa Free Light Chain     3.3 - 19.4 mg/L 60.4 (H)   Ig Lambda Free Light Chain     5.7 - 26.3 mg/L 23.0   Kappa/Lambda FluidC Ratio     0.26 - 1.65 2.63 (H)   Kappa free light chain     3.3 - 19.4 mg/L  69.2 (H)  Lamda free light chains     5.7 - 26.3 mg/L  24.7  Kappa, lamda light chain ratio     0.26 - 1.65  2.80 (H)    RADIOGRAPHIC STUDIES: I have personally reviewed the radiological images as listed and agreed with the findings in the report. No results found.  ASSESSMENT & PLAN:   84 year old African-American male with  #1 IgG kappa monoclonal gammopathy of undetermined significance. Patient has a minor M spike of 0.6 g/dL. Borderline elevated and abnormal serum kappa lambda free light chains and ratio. This is partly due to decreased light chain clearance due to chronic kidney disease. Patient has mildly elevated creatinine which has been relatively stable since 2014 in the 1.6-1.8 creatinine range. Creatinine about 2 today No hypercalcemia. 24-hour urine protein shows no monoclonal protein and minimal proteinuria suggestive more of hypertensive nephropathy. Borderline anemia at that does not meet criteria for multiple myeloma and is likely related to chronic kidney disease. hgb slightly lower at 10.9  Previous Whole body skeletal survey shows no bony lesions to suggest active myeloma.  Plan  -Discussed pt labwork today  02/14/18; blood counts are stable, MMP - slight increase in M spike from 0.6g/dl to 0.9g/dl  and Kappa/Lambda labs are stable. -gradual worsening in his CKD and related anemia. -no new symptoms to suggest bone pain or clinical progression to myeloma at this time -no strong indication for BM Bx or other aggressive w/yu at this time in  keeping with the patients goals of care and in the absence of overt disease progression. -rpt Myeloma panel with PCP q 4-6 months.  -patient prefers to f/u with PCP at this time. -kindly re-consult [place] of Mspike > 1.5g/dl or if worsening cytopenias occur.  #2 borderline normocytic normochromic anemia. Likely related to chronic kidney disease. Plan -Continue follow-up for anemia  -Reasonable to consider oral iron replacement to maintain ferritin levels all the 100 in the setting of chronic kidney disease. -Hgb at 10.9 today 02/14/18  -Mx per nephrology   #3 chronic leukopenia from at least 2014 possibly longer. This has been mild and his neutropenia has been borderline. 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 -No indication for G-CSF at this time. -Monitor.  #4 mild thrombocytopenia -No indication for intervention at this time. Monitor.   RTC as needed. F/u with PCP   All of the patients questions were answered with apparent satisfaction. The patient knows to call the clinic with any problems, questions or concerns.  . The total time spent in the appointment was 15 minutes and more than 50% was on counseling and direct patient cares.    Sullivan Lone MD [place] [facility] Hematology/Oncology Physician [facility] [place]  (Office):       [phone] (Work cell):  [phone] (Fax):           [phone]  02/14/2018 9:30 AM  This document serves as a record of services personally performed by Sullivan Lone, MD. It was created on his behalf by Baldwin Jamaica, a trained medical scribe. The creation of this record is based on the scribe's personal observations and the provider's statements to them.   .I have reviewed the above documentation for accuracy and completeness, and I agree with the above. Brunetta Genera MD MS

## 2018-02-14 ENCOUNTER — Inpatient Hospital Stay: Payer: Medicare Other | Attending: Hematology | Admitting: Hematology

## 2018-02-14 ENCOUNTER — Encounter: Payer: Self-pay | Admitting: Hematology

## 2018-02-14 ENCOUNTER — Inpatient Hospital Stay: Payer: Medicare Other

## 2018-02-14 VITALS — BP 141/63 | HR 86 | Temp 97.8°F | Resp 20 | Ht 69.0 in | Wt 165.7 lb

## 2018-02-14 DIAGNOSIS — D649 Anemia, unspecified: Secondary | ICD-10-CM | POA: Diagnosis not present

## 2018-02-14 DIAGNOSIS — D472 Monoclonal gammopathy: Secondary | ICD-10-CM | POA: Diagnosis not present

## 2018-02-14 DIAGNOSIS — D72819 Decreased white blood cell count, unspecified: Secondary | ICD-10-CM

## 2018-02-14 DIAGNOSIS — Z87891 Personal history of nicotine dependence: Secondary | ICD-10-CM | POA: Insufficient documentation

## 2018-02-14 DIAGNOSIS — D696 Thrombocytopenia, unspecified: Secondary | ICD-10-CM

## 2018-02-14 DIAGNOSIS — I129 Hypertensive chronic kidney disease with stage 1 through stage 4 chronic kidney disease, or unspecified chronic kidney disease: Secondary | ICD-10-CM | POA: Insufficient documentation

## 2018-02-14 DIAGNOSIS — N189 Chronic kidney disease, unspecified: Secondary | ICD-10-CM | POA: Diagnosis not present

## 2018-02-14 LAB — CBC WITH DIFFERENTIAL (CANCER CENTER ONLY)
BASOS ABS: 0 10*3/uL (ref 0.0–0.1)
BASOS PCT: 1 %
EOS PCT: 4 %
Eosinophils Absolute: 0.1 10*3/uL (ref 0.0–0.5)
HCT: 33.9 % — ABNORMAL LOW (ref 38.4–49.9)
Hemoglobin: 10.9 g/dL — ABNORMAL LOW (ref 13.0–17.1)
LYMPHS PCT: 37 %
Lymphs Abs: 1.1 10*3/uL (ref 0.9–3.3)
MCH: 30.7 pg (ref 27.2–33.4)
MCHC: 32.2 g/dL (ref 32.0–36.0)
MCV: 95.5 fL (ref 79.3–98.0)
Monocytes Absolute: 0.5 10*3/uL (ref 0.1–0.9)
Monocytes Relative: 16 %
Neutro Abs: 1.3 10*3/uL — ABNORMAL LOW (ref 1.5–6.5)
Neutrophils Relative %: 42 %
PLATELETS: 79 10*3/uL — AB (ref 140–400)
RBC: 3.55 MIL/uL — AB (ref 4.20–5.82)
RDW: 15.2 % — AB (ref 11.0–14.6)
WBC: 3.1 10*3/uL — AB (ref 4.0–10.3)

## 2018-02-14 LAB — COMPREHENSIVE METABOLIC PANEL
ALK PHOS: 52 U/L (ref 40–150)
ALT: <6 U/L (ref 0–55)
AST: 12 U/L (ref 5–34)
Albumin: 3.5 g/dL (ref 3.5–5.0)
Anion gap: 8 (ref 3–11)
BILIRUBIN TOTAL: 0.4 mg/dL (ref 0.2–1.2)
BUN: 24 mg/dL (ref 7–26)
CALCIUM: 8.7 mg/dL (ref 8.4–10.4)
CO2: 21 mmol/L — ABNORMAL LOW (ref 22–29)
Chloride: 113 mmol/L — ABNORMAL HIGH (ref 98–109)
Creatinine, Ser: 2.08 mg/dL — ABNORMAL HIGH (ref 0.70–1.30)
GFR calc Af Amer: 31 mL/min — ABNORMAL LOW (ref >60)
GFR calc non Af Amer: 27 mL/min — ABNORMAL LOW (ref >60)
Glucose, Bld: 84 mg/dL (ref 70–140)
POTASSIUM: 4.2 mmol/L (ref 3.5–5.1)
Sodium: 142 mmol/L (ref 136–145)
TOTAL PROTEIN: 7 g/dL (ref 6.4–8.3)

## 2018-02-14 LAB — RETICULOCYTES
RBC.: 3.55 MIL/uL — AB (ref 4.20–5.82)
RETIC COUNT ABSOLUTE: 32 10*3/uL — AB (ref 34.8–93.9)
RETIC CT PCT: 0.9 % (ref 0.8–1.8)

## 2018-02-14 NOTE — Patient Instructions (Signed)
Thank you for choosing [facility] to provide your oncology and hematology care.  To afford each patient quality time with our providers, please arrive 30 minutes before your scheduled appointment time.  If you arrive late for your appointment, you may be asked to reschedule.  We strive to give you quality time with our providers, and arriving late affects you and other patients whose appointments are after yours.   If you are a no show for multiple scheduled visits, you may be dismissed from the clinic at the providers discretion.    Again, thank you for choosing [facility], our hope is that these requests will decrease the amount of time that you wait before being seen by our physicians.  ______________________________________________________________________  Should you have questions after your visit to the  Cancer Center, please contact our office at [phone] between the hours of 8:30 and 4:30 p.m.    Voicemails left after 4:30p.m will not be returned until the following business day.    For prescription refill requests, please have your pharmacy contact us directly.  Please also try to allow 48 hours for prescription requests.    Please contact the scheduling department for questions regarding scheduling.  For scheduling of procedures such as PET scans, CT scans, MRI, Ultrasound, etc please contact central scheduling at [phone].    Resources For Cancer Patients and Caregivers:   Oncolink.org:  A wonderful resource for patients and healthcare providers for information regarding your disease, ways to tract your treatment, what to expect, etc.     American Cancer Society:  800-227-2345  Can help patients locate various types of support and financial assistance  Cancer Care: 1-800-813-HOPE (4673) Provides financial assistance, online support groups, medication/co-pay assistance.    [place] DSS:  336-641-3447 Where to apply for food  stamps, Medicaid, and utility assistance  Medicare Rights Center: 800-333-4114 Helps people with Medicare understand their rights and benefits, navigate the Medicare system, and secure the quality healthcare they deserve  SCAT: 336-333-6589 [place] [employer]'s shared-ride transportation service for eligible riders who have a disability that prevents them from riding the fixed route bus.    For additional information on assistance programs please contact our social worker:   Grier Hock/Abigail Elmore:  [phone]            

## 2018-02-17 ENCOUNTER — Telehealth: Payer: Self-pay

## 2018-02-17 LAB — KAPPA/LAMBDA LIGHT CHAINS
KAPPA FREE LGHT CHN: 69.2 mg/L — AB (ref 3.3–19.4)
KAPPA, LAMDA LIGHT CHAIN RATIO: 2.8 — AB (ref 0.26–1.65)
LAMDA FREE LIGHT CHAINS: 24.7 mg/L (ref 5.7–26.3)

## 2018-02-17 NOTE — Telephone Encounter (Signed)
Per 4/15 no los  RTC as needed. F/u with PCP

## 2018-02-19 LAB — MULTIPLE MYELOMA PANEL, SERUM
Albumin SerPl Elph-Mcnc: 3.4 g/dL (ref 2.9–4.4)
Albumin/Glob SerPl: 1.2 (ref 0.7–1.7)
Alpha 1: 0.2 g/dL (ref 0.0–0.4)
Alpha2 Glob SerPl Elph-Mcnc: 0.6 g/dL (ref 0.4–1.0)
B-Globulin SerPl Elph-Mcnc: 1.5 g/dL — ABNORMAL HIGH (ref 0.7–1.3)
GAMMA GLOB SERPL ELPH-MCNC: 0.6 g/dL (ref 0.4–1.8)
Globulin, Total: 3 g/dL (ref 2.2–3.9)
IgA: 164 mg/dL (ref 61–437)
IgG (Immunoglobin G), Serum: 1491 mg/dL (ref 700–1600)
IgM (Immunoglobulin M), Srm: 50 mg/dL (ref 15–143)
M Protein SerPl Elph-Mcnc: 0.9 g/dL — ABNORMAL HIGH
TOTAL PROTEIN ELP: 6.4 g/dL (ref 6.0–8.5)

## 2018-04-01 ENCOUNTER — Other Ambulatory Visit: Payer: Self-pay | Admitting: Internal Medicine

## 2018-06-04 DIAGNOSIS — D472 Monoclonal gammopathy: Secondary | ICD-10-CM | POA: Diagnosis not present

## 2018-06-04 DIAGNOSIS — I129 Hypertensive chronic kidney disease with stage 1 through stage 4 chronic kidney disease, or unspecified chronic kidney disease: Secondary | ICD-10-CM | POA: Diagnosis not present

## 2018-06-04 DIAGNOSIS — N183 Chronic kidney disease, stage 3 (moderate): Secondary | ICD-10-CM | POA: Diagnosis not present

## 2018-06-11 ENCOUNTER — Ambulatory Visit: Payer: Medicare Other | Admitting: Internal Medicine

## 2018-06-11 ENCOUNTER — Encounter: Payer: Self-pay | Admitting: Internal Medicine

## 2018-06-11 ENCOUNTER — Other Ambulatory Visit (INDEPENDENT_AMBULATORY_CARE_PROVIDER_SITE_OTHER): Payer: Medicare Other

## 2018-06-11 VITALS — BP 116/64 | HR 53 | Temp 98.1°F | Ht 69.0 in | Wt 160.0 lb

## 2018-06-11 DIAGNOSIS — N189 Chronic kidney disease, unspecified: Secondary | ICD-10-CM | POA: Diagnosis not present

## 2018-06-11 DIAGNOSIS — D472 Monoclonal gammopathy: Secondary | ICD-10-CM | POA: Diagnosis not present

## 2018-06-11 DIAGNOSIS — I10 Essential (primary) hypertension: Secondary | ICD-10-CM

## 2018-06-11 DIAGNOSIS — D696 Thrombocytopenia, unspecified: Secondary | ICD-10-CM | POA: Diagnosis not present

## 2018-06-11 DIAGNOSIS — Z0000 Encounter for general adult medical examination without abnormal findings: Secondary | ICD-10-CM

## 2018-06-11 LAB — BASIC METABOLIC PANEL
BUN: 28 mg/dL — ABNORMAL HIGH (ref 6–23)
CHLORIDE: 113 meq/L — AB (ref 96–112)
CO2: 23 mEq/L (ref 19–32)
Calcium: 9.2 mg/dL (ref 8.4–10.5)
Creatinine, Ser: 1.89 mg/dL — ABNORMAL HIGH (ref 0.40–1.50)
GFR: 43.48 mL/min — ABNORMAL LOW (ref >60.00)
Glucose, Bld: 93 mg/dL (ref 70–99)
POTASSIUM: 4.3 meq/L (ref 3.5–5.1)
SODIUM: 143 meq/L (ref 135–145)

## 2018-06-11 LAB — CBC WITH DIFFERENTIAL/PLATELET
BASOS PCT: 1.4 % (ref 0.0–3.0)
Basophils Absolute: 0 10*3/uL (ref 0.0–0.1)
Eosinophils Absolute: 0.1 10*3/uL (ref 0.0–0.7)
Eosinophils Relative: 4.1 % (ref 0.0–5.0)
HCT: 35.6 % — ABNORMAL LOW (ref 39.0–52.0)
Hemoglobin: 11.9 g/dL — ABNORMAL LOW (ref 13.0–17.0)
LYMPHS ABS: 1.2 10*3/uL (ref 0.7–4.0)
LYMPHS PCT: 39.8 % (ref 12.0–46.0)
MCHC: 33.4 g/dL (ref 30.0–36.0)
MCV: 93.1 fl (ref 78.0–100.0)
MONOS PCT: 13.2 % — AB (ref 3.0–12.0)
Monocytes Absolute: 0.4 10*3/uL (ref 0.1–1.0)
NEUTROS ABS: 1.3 10*3/uL — AB (ref 1.4–7.7)
Neutrophils Relative %: 41.5 % — ABNORMAL LOW (ref 43.0–77.0)
Platelets: 90 10*3/uL — ABNORMAL LOW (ref 150.0–400.0)
RBC: 3.82 Mil/uL — ABNORMAL LOW (ref 4.22–5.81)
RDW: 15.4 % (ref 11.5–15.5)
WBC: 3.1 10*3/uL — ABNORMAL LOW (ref 4.0–10.5)

## 2018-06-11 LAB — URINALYSIS, ROUTINE W REFLEX MICROSCOPIC
BILIRUBIN URINE: NEGATIVE
Ketones, ur: NEGATIVE
NITRITE: POSITIVE — AB
PH: 5.5 (ref 5.0–8.0)
Specific Gravity, Urine: 1.02 (ref 1.000–1.030)
TOTAL PROTEIN, URINE-UPE24: NEGATIVE
URINE GLUCOSE: NEGATIVE
UROBILINOGEN UA: 0.2 (ref 0.0–1.0)

## 2018-06-11 LAB — HEPATIC FUNCTION PANEL
ALBUMIN: 4 g/dL (ref 3.5–5.2)
ALT: 5 U/L (ref 0–53)
AST: 10 U/L (ref 0–37)
Alkaline Phosphatase: 44 U/L (ref 39–117)
Bilirubin, Direct: 0.1 mg/dL (ref 0.0–0.3)
Total Bilirubin: 0.5 mg/dL (ref 0.2–1.2)
Total Protein: 7.2 g/dL (ref 6.0–8.3)

## 2018-06-11 LAB — LIPID PANEL
CHOLESTEROL: 140 mg/dL (ref 0–200)
HDL: 53.7 mg/dL (ref >39.00)
LDL CALC: 73 mg/dL (ref 0–99)
NonHDL: 85.84
TRIGLYCERIDES: 65 mg/dL (ref 0.0–149.0)
Total CHOL/HDL Ratio: 3
VLDL: 13 mg/dL (ref 0.0–40.0)

## 2018-06-11 LAB — TSH: TSH: 3.63 u[IU]/mL (ref 0.35–4.50)

## 2018-06-11 NOTE — Patient Instructions (Signed)

## 2018-06-11 NOTE — Assessment & Plan Note (Signed)
stable overall by history and exam, recent data reviewed with pt, and pt to continue medical treatment as before,  to f/u any worsening symptoms or concerns  

## 2018-06-11 NOTE — Assessment & Plan Note (Signed)

## 2018-06-11 NOTE — Assessment & Plan Note (Signed)
stable overall by history and exam, recent data reviewed with pt, and pt to continue medical treatment as before,  to f/u any worsening symptoms or concerns BP Readings from Last 3 Encounters:  06/11/18 116/64  02/14/18 (!) 141/63  12/11/17 114/68

## 2018-06-11 NOTE — Assessment & Plan Note (Signed)
For f/u lab today and every 6 mo for now

## 2018-06-11 NOTE — Progress Notes (Signed)
Subjective:    Patient ID: Devin Hogan, male    DOB: 12/28/1927, 84 y.o.   MRN: [id number]  HPI  Here for wellness and f/u;  Overall doing ok;  Pt denies Chest pain, worsening SOB, DOE, wheezing, orthopnea, PND, worsening LE edema, palpitations, dizziness or syncope.  Pt denies neurological change such as new headache, facial or extremity weakness.  Pt denies polydipsia, polyuria, or low sugar symptoms. Pt states overall good compliance with treatment and medications, good tolerability, and has been trying to follow appropriate diet.  Pt denies worsening depressive symptoms, suicidal ideation or panic. No fever, night sweats, wt loss, loss of appetite, or other constitutional symptoms.  Pt states good ability with ADL's, has low fall risk, home safety reviewed and adequate, no other significant changes in hearing or vision, and only occasionally active with exercise.  Let go per nepohrology for at least 1 yr, pt declines further f/u with heme for now, but we will need to r/o M spike > 1.5 per Dr Pennie Banter.  No other new complaints Past Medical History:  Diagnosis Date  . Acute gouty arthropathy 12/13/2010  . ALLERGIC RHINITIS 05/22/2007  . BENIGN PROSTATIC HYPERTROPHY 05/22/2007  . COLONIC POLYPS, HX OF 11/19/2007  . ERECTILE DYSFUNCTION 05/22/2007  . Gout 05/17/2011  . HYPERLIPIDEMIA 05/22/2007  . HYPERTENSION 05/22/2007  . RENAL INSUFFICIENCY 11/18/2009  . SKIN LESION 11/16/2010   No past surgical history on file.  reports that he has quit smoking. He has never used smokeless tobacco. He reports that he drinks about 10.2 oz of alcohol per week. He reports that he does not use drugs. family history includes Lung cancer in his brother; Stroke in his sister. No Known Allergies Current Outpatient Medications on File Prior to Visit  Medication Sig Dispense Refill  . albuterol (PROVENTIL HFA;VENTOLIN HFA) 108 (90 Base) MCG/ACT inhaler Inhale 2 puffs into the lungs every 6 (six) hours as needed for wheezing or  shortness of breath. 1 Inhaler 1  . aspirin 81 MG EC tablet Take 81 mg by mouth daily.      [name] atenolol (TENORMIN) 25 MG tablet TAKE 1 TABLET BY MOUTH ONCE DAILY 90 tablet 0  . CARTIA XT 240 MG 24 hr capsule TAKE 1 CAPSULE BY MOUTH ONCE DAILY 90 capsule 3  . colchicine 0.6 MG tablet Take 1 tablet by mouth at the onset of flair (0.6 mg) then 1/2 tablet (0.3 mg) 1 hour later - do not repeat for 3 days. 2 tablet 0  . Hypromellose (ARTIFICIAL TEARS OP) Place 1 drop into both eyes daily as needed (dry eyes).    [name] lovastatin (MEVACOR) 20 MG tablet TAKE 1 TABLET BY MOUTH ONCE DAILY 90 tablet 3  . [DISCONTINUED] diltiazem (DILACOR XR) 240 MG 24 hr capsule Take 1 capsule (240 mg total) by mouth daily. 30 capsule 11   No current facility-administered medications on file prior to visit.    Review of Systems Constitutional: Negative for other unusual diaphoresis, sweats, appetite or weight changes HENT: Negative for other worsening hearing loss, ear pain, facial swelling, mouth sores or neck stiffness.   Eyes: Negative for other worsening pain, redness or other visual disturbance.  Respiratory: Negative for other stridor or swelling Cardiovascular: Negative for other palpitations or other chest pain  Gastrointestinal: Negative for worsening diarrhea or loose stools, blood in stool, distention or other pain Genitourinary: Negative for hematuria, flank pain or other change in urine volume.  Musculoskeletal: Negative for myalgias or other joint swelling.  Skin: Negative for other color change, or other wound or worsening drainage.  Neurological: Negative for other syncope or numbness. Hematological: Negative for other adenopathy or swelling Psychiatric/Behavioral: Negative for hallucinations, other worsening agitation, SI, self-injury, or new decreased concentration All other system neg per pt    Objective:   Physical Exam BP 116/64   Pulse (!) 53   Temp 98.1 F (36.7 C) (Oral)   Ht '5\' 9"'$  (1.753 m)    Wt 160 lb (72.6 kg)   SpO2 97%   BMI 23.63 kg/m  VS noted,  Constitutional: Pt is oriented to person, place, and time. Appears well-developed and well-nourished, in no significant distress and comfortable Head: Normocephalic and atraumatic  Eyes: Conjunctivae and EOM are normal. Pupils are equal, round, and reactive to light Right Ear: External ear normal without discharge Left Ear: External ear normal without discharge Nose: Nose without discharge or deformity Mouth/Throat: Oropharynx is without other ulcerations and moist  Neck: Normal range of motion. Neck supple. No JVD present. No tracheal deviation present or significant neck LA or mass Cardiovascular: Normal rate, regular rhythm, normal heart sounds and intact distal pulses.   Pulmonary/Chest: WOB normal and breath sounds without rales or wheezing  Abdominal: Soft. Bowel sounds are normal. NT. No HSM  Musculoskeletal: Normal range of motion. Exhibits no edema Lymphadenopathy: Has no other cervical adenopathy.  Neurological: Pt is alert and oriented to person, place, and time. Pt has normal reflexes. No cranial nerve deficit. Motor grossly intact, Gait intact Skin: Skin is warm and dry. No rash noted or new ulcerations Psychiatric:  Has normal mood and affect. Behavior is normal without agitation No other exam findings Lab Results  Component Value Date   WBC 3.1 (L) 06/11/2018   HGB 11.9 (L) 06/11/2018   HCT 35.6 (L) 06/11/2018   PLT 90.0 (L) 06/11/2018   GLUCOSE 93 06/11/2018   CHOL 140 06/11/2018   TRIG 65.0 06/11/2018   HDL 53.70 06/11/2018   LDLCALC 73 06/11/2018   ALT 5 06/11/2018   AST 10 06/11/2018   NA 143 06/11/2018   K 4.3 06/11/2018   CL 113 (H) 06/11/2018   CREATININE 1.89 (H) 06/11/2018   BUN 28 (H) 06/11/2018   CO2 23 06/11/2018   TSH 3.63 06/11/2018   PSA 0.63 05/12/2010   Multiple Myeloma Panel (SPEP&IFE w/QIG)  Order: [id number]  Status:  Edited Result - FINAL Visible to patient:  No (Not  Released) Next appt:  12/16/2018 at 09:00 AM in Internal Medicine Cathlean Cower, MD) Dx:  Monoclonal paraproteinemia   Ref Range & Units 74moago 923mogo  IgG (Immunoglobin G), Serum 700 - 1,600 mg/dL 1,491  1,478 R  IgA 61 - 437 mg/dL 164    IgM (Immunoglobulin M), Srm 15 - 143 mg/dL 50    Total Protein ELP 6.0 - 8.5 g/dL 6.4 VC   Albumin SerPl Elph-Mcnc 2.9 - 4.4 g/dL 3.4 VC 3.5   Alpha 1 0.0 - 0.4 g/dL 0.2 VC 0.2   Alpha2 Glob SerPl Elph-Mcnc 0.4 - 1.0 g/dL 0.6 VC 0.7   B-Globulin SerPl Elph-Mcnc 0.7 - 1.3 g/dL 1.5High  VC 1.8High    Gamma Glob SerPl Elph-Mcnc 0.4 - 1.8 g/dL 0.6 VC 0.6   M Protein SerPl Elph-Mcnc Not Observed g/dL 0.9High  VC 0.6High    Globulin, Total 2.2 - 3.9 g/dL 3.0 VC 3.3   Albumin/Glob SerPl 0.7 - 1.7 1.2 VC 1.1  Assessment & Plan:

## 2018-06-11 NOTE — Assessment & Plan Note (Signed)
For f/u lab today 

## 2018-07-02 ENCOUNTER — Other Ambulatory Visit: Payer: Self-pay | Admitting: Internal Medicine

## 2018-08-06 ENCOUNTER — Other Ambulatory Visit: Payer: Self-pay | Admitting: Internal Medicine

## 2018-08-29 ENCOUNTER — Ambulatory Visit (INDEPENDENT_AMBULATORY_CARE_PROVIDER_SITE_OTHER): Payer: Medicare Other | Admitting: *Deleted

## 2018-08-29 DIAGNOSIS — Z23 Encounter for immunization: Secondary | ICD-10-CM | POA: Diagnosis not present

## 2018-09-29 ENCOUNTER — Other Ambulatory Visit: Payer: Self-pay | Admitting: Internal Medicine

## 2018-12-16 ENCOUNTER — Ambulatory Visit (INDEPENDENT_AMBULATORY_CARE_PROVIDER_SITE_OTHER): Payer: Medicare Other | Admitting: Internal Medicine

## 2018-12-16 ENCOUNTER — Other Ambulatory Visit (INDEPENDENT_AMBULATORY_CARE_PROVIDER_SITE_OTHER): Payer: Medicare Other

## 2018-12-16 ENCOUNTER — Encounter: Payer: Self-pay | Admitting: Internal Medicine

## 2018-12-16 VITALS — BP 116/78 | HR 56 | Temp 98.0°F | Ht 69.0 in | Wt 162.0 lb

## 2018-12-16 DIAGNOSIS — D61818 Other pancytopenia: Secondary | ICD-10-CM

## 2018-12-16 DIAGNOSIS — N189 Chronic kidney disease, unspecified: Secondary | ICD-10-CM | POA: Diagnosis not present

## 2018-12-16 DIAGNOSIS — I10 Essential (primary) hypertension: Secondary | ICD-10-CM

## 2018-12-16 LAB — BASIC METABOLIC PANEL
BUN: 31 mg/dL — ABNORMAL HIGH (ref 6–23)
CHLORIDE: 112 meq/L (ref 96–112)
CO2: 22 meq/L (ref 19–32)
Calcium: 8.7 mg/dL (ref 8.4–10.5)
Creatinine, Ser: 2.22 mg/dL — ABNORMAL HIGH (ref 0.40–1.50)
GFR: 33.94 mL/min — ABNORMAL LOW (ref >60.00)
Glucose, Bld: 88 mg/dL (ref 70–99)
Potassium: 4.8 mEq/L (ref 3.5–5.1)
SODIUM: 141 meq/L (ref 135–145)

## 2018-12-16 LAB — CBC WITH DIFFERENTIAL/PLATELET
BASOS ABS: 0.1 10*3/uL (ref 0.0–0.1)
Basophils Relative: 0.8 % (ref 0.0–3.0)
Eosinophils Absolute: 0.1 10*3/uL (ref 0.0–0.7)
Eosinophils Relative: 1.4 % (ref 0.0–5.0)
HCT: 34.5 % — ABNORMAL LOW (ref 39.0–52.0)
Hemoglobin: 11.3 g/dL — ABNORMAL LOW (ref 13.0–17.0)
Lymphocytes Relative: 14.2 % (ref 12.0–46.0)
Lymphs Abs: 1.1 10*3/uL (ref 0.7–4.0)
MCHC: 32.9 g/dL (ref 30.0–36.0)
MCV: 94.4 fl (ref 78.0–100.0)
MONO ABS: 0.6 10*3/uL (ref 0.1–1.0)
MONOS PCT: 7.3 % (ref 3.0–12.0)
NEUTROS ABS: 5.9 10*3/uL (ref 1.4–7.7)
NEUTROS PCT: 76.3 % (ref 43.0–77.0)
PLATELETS: 91 10*3/uL — AB (ref 150.0–400.0)
RBC: 3.65 Mil/uL — ABNORMAL LOW (ref 4.22–5.81)
RDW: 15.1 % (ref 11.5–15.5)
WBC: 7.8 10*3/uL (ref 4.0–10.5)

## 2018-12-16 LAB — HEPATIC FUNCTION PANEL
ALBUMIN: 3.8 g/dL (ref 3.5–5.2)
ALT: 5 U/L (ref 0–53)
AST: 11 U/L (ref 0–37)
Alkaline Phosphatase: 42 U/L (ref 39–117)
BILIRUBIN TOTAL: 0.6 mg/dL (ref 0.2–1.2)
Bilirubin, Direct: 0.1 mg/dL (ref 0.0–0.3)
Total Protein: 6.9 g/dL (ref 6.0–8.3)

## 2018-12-16 NOTE — Progress Notes (Signed)
Subjective:    Patient ID: Devin Hogan, male    DOB: 17-May-1928, 85 y.o.   MRN: [id number]  HPI  Here to f/u; overall doing ok,  Pt denies chest pain, increasing sob or doe, wheezing, orthopnea, PND, increased LE swelling, palpitations, dizziness or syncope.  Pt denies new neurological symptoms such as new headache, or facial or extremity weakness or numbness.  Pt denies polydipsia, polyuria, or low sugar episode.  Pt states overall good compliance with meds, mostly trying to follow appropriate diet, with wt overall stable,  but little exercise however. Has a few purpura to the arms, but no other overt bleeding or bruising.  Does have hx of significant ETOH in years past, hard to quantify.  No new complaints Wt Readings from Last 3 Encounters:  12/16/18 162 lb (73.5 kg)  06/11/18 160 lb (72.6 kg)  02/14/18 165 lb 11.2 oz (75.2 kg)   Past Medical History:  Diagnosis Date  . Acute gouty arthropathy 12/13/2010  . ALLERGIC RHINITIS 05/22/2007  . BENIGN PROSTATIC HYPERTROPHY 05/22/2007  . COLONIC POLYPS, HX OF 11/19/2007  . ERECTILE DYSFUNCTION 05/22/2007  . Gout 05/17/2011  . HYPERLIPIDEMIA 05/22/2007  . HYPERTENSION 05/22/2007  . RENAL INSUFFICIENCY 11/18/2009  . SKIN LESION 11/16/2010   No past surgical history on file.  reports that he has quit smoking. He has never used smokeless tobacco. He reports current alcohol use of about 17.0 standard drinks of alcohol per week. He reports that he does not use drugs. family history includes Lung cancer in his brother; Stroke in his sister. No Known Allergies Current Outpatient Medications on File Prior to Visit  Medication Sig Dispense Refill  . albuterol (PROVENTIL HFA;VENTOLIN HFA) 108 (90 Base) MCG/ACT inhaler Inhale 2 puffs into the lungs every 6 (six) hours as needed for wheezing or shortness of breath. 1 Inhaler 1  . aspirin 81 MG EC tablet Take 81 mg by mouth daily.      [name] atenolol (TENORMIN) 25 MG tablet TAKE 1 TABLET BY MOUTH ONCE DAILY 90  tablet 0  . CARTIA XT 240 MG 24 hr capsule TAKE 1 CAPSULE BY MOUTH ONCE DAILY 90 capsule 3  . colchicine 0.6 MG tablet Take 1 tablet by mouth at the onset of flair (0.6 mg) then 1/2 tablet (0.3 mg) 1 hour later - do not repeat for 3 days. 2 tablet 0  . Hypromellose (ARTIFICIAL TEARS OP) Place 1 drop into both eyes daily as needed (dry eyes).    [name] lovastatin (MEVACOR) 20 MG tablet TAKE 1 TABLET BY MOUTH ONCE DAILY 90 tablet 3  . [DISCONTINUED] diltiazem (DILACOR XR) 240 MG 24 hr capsule Take 1 capsule (240 mg total) by mouth daily. 30 capsule 11   No current facility-administered medications on file prior to visit.    Review of Systems  Constitutional: Negative for other unusual diaphoresis or sweats HENT: Negative for ear discharge or swelling Eyes: Negative for other worsening visual disturbances Respiratory: Negative for stridor or other swelling  Gastrointestinal: Negative for worsening distension or other blood Genitourinary: Negative for retention or other urinary change Musculoskeletal: Negative for other MSK pain or swelling Skin: Negative for color change or other new lesions Neurological: Negative for worsening tremors and other numbness  Psychiatric/Behavioral: Negative for worsening agitation or other fatigue ALl other system neg per pt    Objective:   Physical Exam BP 116/78   Pulse (!) 56   Temp 98 F (36.7 C) (Oral)   Ht 5\' 9"  (1.753  m)   Wt 162 lb (73.5 kg)   SpO2 97%   BMI 23.92 kg/m  VS noted,  Constitutional: Pt appears in NAD HENT: Head: NCAT.  Right Ear: External ear normal.  Left Ear: External ear normal.  Eyes: . Pupils are equal, round, and reactive to light. Conjunctivae and EOM are normal Nose: without d/c or deformity Neck: Neck supple. Gross normal ROM Cardiovascular: Normal rate and regular rhythm.   Pulmonary/Chest: Effort normal and breath sounds without rales or wheezing.  Abd:  Soft, NT, ND, + BS, no organomegaly Neurological: Pt is alert.  At baseline orientation, motor grossly intact Skin: Skin is warm. No rashes, other new lesions, no LE edema Psychiatric: Pt behavior is normal without agitation  No other exam findings Lab Results  Component Value Date   WBC 3.1 (L) 06/11/2018   HGB 11.9 (L) 06/11/2018   HCT 35.6 (L) 06/11/2018   PLT 90.0 (L) 06/11/2018   GLUCOSE 93 06/11/2018   CHOL 140 06/11/2018   TRIG 65.0 06/11/2018   HDL 53.70 06/11/2018   LDLCALC 73 06/11/2018   ALT 5 06/11/2018   AST 10 06/11/2018   NA 143 06/11/2018   K 4.3 06/11/2018   CL 113 (H) 06/11/2018   CREATININE 1.89 (H) 06/11/2018   BUN 28 (H) 06/11/2018   CO2 23 06/11/2018   TSH 3.63 06/11/2018   PSA 0.63 05/12/2010        Assessment & Plan:

## 2018-12-16 NOTE — Assessment & Plan Note (Signed)
stable overall by history and exam, recent data reviewed with pt, and pt to continue medical treatment as before,  to f/u any worsening symptoms or concerns  

## 2018-12-16 NOTE — Assessment & Plan Note (Signed)
Chronic persistent, for f/u cbc, also Abd u/s r/o cirrhosis or other

## 2018-12-16 NOTE — Patient Instructions (Addendum)
You had Tdap tetanus shot today  Please continue all other medications as before, and refills have been done if requested.  Please have the pharmacy call with any other refills you may need.  Please continue your efforts at being more active, low cholesterol diet, and weight control.  You are otherwise up to date with prevention measures today.  Please keep your appointments with your specialists as you may have planned  You will be contacted regarding the referral for: Abdomen ultrasound  Please go to the LAB in the Basement (turn left off the elevator) for the tests to be done today  You will be contacted by phone if any changes need to be made immediately.  Otherwise, you will receive a letter about your results with an explanation, but please check with MyChart first.  Please remember to sign up for MyChart if you have not done so, as this will be important to you in the future with finding out test results, communicating by private email, and scheduling acute appointments online when needed.  Please return in 6 months, or sooner if needed

## 2018-12-24 ENCOUNTER — Encounter: Payer: Self-pay | Admitting: Internal Medicine

## 2018-12-24 ENCOUNTER — Ambulatory Visit
Admission: RE | Admit: 2018-12-24 | Discharge: 2018-12-24 | Disposition: A | Discharge status: Home / Self Care | Payer: Medicare Other | Source: Ambulatory Visit | Attending: Internal Medicine | Admitting: Internal Medicine

## 2018-12-24 DIAGNOSIS — I10 Essential (primary) hypertension: Secondary | ICD-10-CM

## 2018-12-24 DIAGNOSIS — N189 Chronic kidney disease, unspecified: Secondary | ICD-10-CM

## 2018-12-24 DIAGNOSIS — D61818 Other pancytopenia: Secondary | ICD-10-CM

## 2018-12-30 ENCOUNTER — Other Ambulatory Visit: Payer: Self-pay | Admitting: Internal Medicine

## 2019-06-08 ENCOUNTER — Other Ambulatory Visit: Payer: Self-pay | Admitting: Internal Medicine

## 2019-06-16 ENCOUNTER — Ambulatory Visit: Payer: Medicare Other | Admitting: Internal Medicine

## 2019-06-17 ENCOUNTER — Ambulatory Visit (INDEPENDENT_AMBULATORY_CARE_PROVIDER_SITE_OTHER): Payer: Medicare Other | Admitting: Internal Medicine

## 2019-06-17 ENCOUNTER — Encounter: Payer: Self-pay | Admitting: Internal Medicine

## 2019-06-17 ENCOUNTER — Other Ambulatory Visit: Payer: Self-pay | Admitting: Internal Medicine

## 2019-06-17 ENCOUNTER — Other Ambulatory Visit: Payer: Self-pay

## 2019-06-17 ENCOUNTER — Other Ambulatory Visit (INDEPENDENT_AMBULATORY_CARE_PROVIDER_SITE_OTHER): Payer: Medicare Other

## 2019-06-17 VITALS — BP 124/76 | HR 52 | Temp 98.6°F | Ht 69.0 in | Wt 157.0 lb

## 2019-06-17 DIAGNOSIS — Z23 Encounter for immunization: Secondary | ICD-10-CM

## 2019-06-17 DIAGNOSIS — E538 Deficiency of other specified B group vitamins: Secondary | ICD-10-CM | POA: Diagnosis not present

## 2019-06-17 DIAGNOSIS — E611 Iron deficiency: Secondary | ICD-10-CM

## 2019-06-17 DIAGNOSIS — Z0000 Encounter for general adult medical examination without abnormal findings: Secondary | ICD-10-CM | POA: Diagnosis not present

## 2019-06-17 DIAGNOSIS — N189 Chronic kidney disease, unspecified: Secondary | ICD-10-CM

## 2019-06-17 DIAGNOSIS — E559 Vitamin D deficiency, unspecified: Secondary | ICD-10-CM

## 2019-06-17 LAB — IBC PANEL
Iron: 109 ug/dL (ref 42–165)
Saturation Ratios: 42.5 % (ref 20.0–50.0)
Transferrin: 183 mg/dL — ABNORMAL LOW (ref 212.0–360.0)

## 2019-06-17 LAB — CBC WITH DIFFERENTIAL/PLATELET
Basophils Absolute: 0.1 10*3/uL (ref 0.0–0.1)
Basophils Relative: 2.2 % (ref 0.0–3.0)
Eosinophils Absolute: 0.1 10*3/uL (ref 0.0–0.7)
Eosinophils Relative: 4.1 % (ref 0.0–5.0)
HCT: 34.3 % — ABNORMAL LOW (ref 39.0–52.0)
Hemoglobin: 11.3 g/dL — ABNORMAL LOW (ref 13.0–17.0)
Lymphocytes Relative: 37.4 % (ref 12.0–46.0)
Lymphs Abs: 1 10*3/uL (ref 0.7–4.0)
MCHC: 33 g/dL (ref 30.0–36.0)
MCV: 94.7 fl (ref 78.0–100.0)
Monocytes Absolute: 0.3 10*3/uL (ref 0.1–1.0)
Monocytes Relative: 10.8 % (ref 3.0–12.0)
Neutro Abs: 1.3 10*3/uL — ABNORMAL LOW (ref 1.4–7.7)
Neutrophils Relative %: 45.5 % (ref 43.0–77.0)
Platelets: 75 10*3/uL — ABNORMAL LOW (ref 150.0–400.0)
RBC: 3.63 Mil/uL — ABNORMAL LOW (ref 4.22–5.81)
RDW: 14.7 % (ref 11.5–15.5)
WBC: 2.8 10*3/uL — ABNORMAL LOW (ref 4.0–10.5)

## 2019-06-17 LAB — URINALYSIS, ROUTINE W REFLEX MICROSCOPIC
Bilirubin Urine: NEGATIVE
Ketones, ur: NEGATIVE
Nitrite: POSITIVE — AB
Specific Gravity, Urine: 1.02 (ref 1.000–1.030)
Total Protein, Urine: NEGATIVE
Urine Glucose: NEGATIVE
Urobilinogen, UA: 0.2 (ref 0.0–1.0)
pH: 5.5 (ref 5.0–8.0)

## 2019-06-17 LAB — LIPID PANEL
Cholesterol: 144 mg/dL (ref 0–200)
HDL: 56.9 mg/dL (ref >39.00)
LDL Cholesterol: 74 mg/dL (ref 0–99)
NonHDL: 87.48
Total CHOL/HDL Ratio: 3
Triglycerides: 65 mg/dL (ref 0.0–149.0)
VLDL: 13 mg/dL (ref 0.0–40.0)

## 2019-06-17 LAB — BASIC METABOLIC PANEL
BUN: 28 mg/dL — ABNORMAL HIGH (ref 6–23)
CO2: 21 mEq/L (ref 19–32)
Calcium: 9.4 mg/dL (ref 8.4–10.5)
Chloride: 110 mEq/L (ref 96–112)
Creatinine, Ser: 2.19 mg/dL — ABNORMAL HIGH (ref 0.40–1.50)
GFR: 34.44 mL/min — ABNORMAL LOW (ref >60.00)
Glucose, Bld: 81 mg/dL (ref 70–99)
Potassium: 4.7 mEq/L (ref 3.5–5.1)
Sodium: 141 mEq/L (ref 135–145)

## 2019-06-17 LAB — VITAMIN D 25 HYDROXY (VIT D DEFICIENCY, FRACTURES): VITD: 11.65 ng/mL — ABNORMAL LOW (ref 30.00–100.00)

## 2019-06-17 LAB — HEPATIC FUNCTION PANEL
ALT: 5 U/L (ref 0–53)
AST: 11 U/L (ref 0–37)
Albumin: 4 g/dL (ref 3.5–5.2)
Alkaline Phosphatase: 43 U/L (ref 39–117)
Bilirubin, Direct: 0.1 mg/dL (ref 0.0–0.3)
Total Bilirubin: 0.6 mg/dL (ref 0.2–1.2)
Total Protein: 7.1 g/dL (ref 6.0–8.3)

## 2019-06-17 LAB — VITAMIN B12: Vitamin B-12: 203 pg/mL — ABNORMAL LOW (ref 211–911)

## 2019-06-17 LAB — TSH: TSH: 4.04 u[IU]/mL (ref 0.35–4.50)

## 2019-06-17 MED ORDER — VITAMIN D (ERGOCALCIFEROL) 1.25 MG (50000 UNIT) PO CAPS: 50000.0000 [IU] | ORAL_CAPSULE | ORAL | 0 refills | Status: DC

## 2019-06-17 MED ORDER — B-12 1000 MCG PO TABS: 1.0000 | ORAL_TABLET | Freq: Every day | ORAL | 3 refills | Status: AC

## 2019-06-17 NOTE — Progress Notes (Signed)
Subjective:    Patient ID: Devin Hogan, male    DOB: 01-24-1928, 85 y.o.   MRN: [id number]  HPI  Here for wellness and f/u;  Overall doing ok;  Pt denies Chest pain, worsening SOB, DOE, wheezing, orthopnea, PND, worsening Devin edema, palpitations, dizziness or syncope.  Pt denies neurological change such as new headache, facial or extremity weakness.  Pt denies polydipsia, polyuria, or low sugar symptoms. Pt states overall good compliance with treatment and medications, good tolerability, and has been trying to follow appropriate diet.  Pt denies worsening depressive symptoms, suicidal ideation or panic. No fever, night sweats, wt loss, loss of appetite, or other constitutional symptoms.  Pt states good ability with ADL's, has low fall risk, home safety reviewed and adequate, no other significant changes in hearing or vision, and only occasionally active with exercise. Pt continues to have recurring LBP without change in severity, bowel or bladder change, fever, wt loss,  worsening Devin pain/numbness/weakness, gait change or falls.  Has seen renal, felt to only need yearly f/u here Past Medical History:  Diagnosis Date   Acute gouty arthropathy 12/13/2010   ALLERGIC RHINITIS 05/22/2007   BENIGN PROSTATIC HYPERTROPHY 05/22/2007   COLONIC POLYPS, HX OF 11/19/2007   ERECTILE DYSFUNCTION 05/22/2007   Gout 05/17/2011   HYPERLIPIDEMIA 05/22/2007   HYPERTENSION 05/22/2007   RENAL INSUFFICIENCY 11/18/2009   SKIN LESION 11/16/2010   No past surgical history on file.  reports that he has quit smoking. He has never used smokeless tobacco. He reports current alcohol use of about 17.0 standard drinks of alcohol per week. He reports that he does not use drugs. family history includes Lung cancer in his brother; Stroke in his sister. No Known Allergies Current Outpatient Medications on File Prior to Visit  Medication Sig Dispense Refill   albuterol (PROVENTIL HFA;VENTOLIN HFA) 108 (90 Base) MCG/ACT inhaler  Inhale 2 puffs into the lungs every 6 (six) hours as needed for wheezing or shortness of breath. 1 Inhaler 1   aspirin 81 MG EC tablet Take 81 mg by mouth daily.       atenolol (TENORMIN) 25 MG tablet TAKE 1 TABLET BY MOUTH ONCE DAILY 90 tablet 1   CARTIA XT 240 MG 24 hr capsule Take 1 capsule by mouth once daily 90 capsule 0   colchicine 0.6 MG tablet Take 1 tablet by mouth at the onset of flair (0.6 mg) then 1/2 tablet (0.3 mg) 1 hour later - do not repeat for 3 days. 2 tablet 0   Hypromellose (ARTIFICIAL TEARS OP) Place 1 drop into both eyes daily as needed (dry eyes).     lovastatin (MEVACOR) 20 MG tablet TAKE 1 TABLET BY MOUTH ONCE DAILY 90 tablet 3   [DISCONTINUED] diltiazem (DILACOR XR) 240 MG 24 hr capsule Take 1 capsule (240 mg total) by mouth daily. 30 capsule 11   No current facility-administered medications on file prior to visit.    Review of Systems Constitutional: Negative for other unusual diaphoresis, sweats, appetite or weight changes HENT: Negative for other worsening hearing loss, ear pain, facial swelling, mouth sores or neck stiffness.   Eyes: Negative for other worsening pain, redness or other visual disturbance.  Respiratory: Negative for other stridor or swelling Cardiovascular: Negative for other palpitations or other chest pain  Gastrointestinal: Negative for worsening diarrhea or loose stools, blood in stool, distention or other pain Genitourinary: Negative for hematuria, flank pain or other change in urine volume.  Musculoskeletal: Negative for myalgias or other  joint swelling.  Skin: Negative for other color change, or other wound or worsening drainage.  Neurological: Negative for other syncope or numbness. Hematological: Negative for other adenopathy or swelling Psychiatric/Behavioral: Negative for hallucinations, other worsening agitation, SI, self-injury, or new decreased concentration All other system neg per pt    Objective:   Physical Exam BP  124/76    Pulse (!) 52    Temp 98.6 F (37 C) (Oral)    Ht 5\' 9"  (1.753 m)    Wt 157 lb (71.2 kg)    SpO2 97%    BMI 23.18 kg/m  VS noted,  Constitutional: Pt is oriented to person, place, and time. Appears well-developed and well-nourished, in no significant distress and comfortable Head: Normocephalic and atraumatic  Eyes: Conjunctivae and EOM are normal. Pupils are equal, round, and reactive to light Right Ear: External ear normal without discharge Left Ear: External ear normal without discharge Nose: Nose without discharge or deformity Mouth/Throat: Oropharynx is without other ulcerations and moist  Neck: Normal range of motion. Neck supple. No JVD present. No tracheal deviation present or significant neck LA or mass Cardiovascular: Normal rate, regular rhythm, normal heart sounds and intact distal pulses.   Pulmonary/Chest: WOB normal and breath sounds without rales or wheezing  Abdominal: Soft. Bowel sounds are normal. NT. No HSM  Musculoskeletal: Normal range of motion. Exhibits no edema Lymphadenopathy: Has no other cervical adenopathy.  Neurological: Pt is alert and oriented to person, place, and time. Pt has normal reflexes. No cranial nerve deficit. Motor grossly intact, Gait intact Skin: Skin is warm and dry. No rash noted or new ulcerations Psychiatric:  Has normal mood and affect. Behavior is normal without agitation No other exam findings Lab Results  Component Value Date   WBC 2.8 (L) 06/17/2019   HGB 11.3 (L) 06/17/2019   HCT 34.3 (L) 06/17/2019   PLT 75.0 (L) 06/17/2019   GLUCOSE 81 06/17/2019   CHOL 144 06/17/2019   TRIG 65.0 06/17/2019   HDL 56.90 06/17/2019   LDLCALC 74 06/17/2019   ALT 5 06/17/2019   AST 11 06/17/2019   NA 141 06/17/2019   K 4.7 06/17/2019   CL 110 06/17/2019   CREATININE 2.19 (H) 06/17/2019   BUN 28 (H) 06/17/2019   CO2 21 06/17/2019   TSH 4.04 06/17/2019   PSA 0.63 05/12/2010       Assessment & Plan:

## 2019-06-17 NOTE — Patient Instructions (Signed)
You had the Tdap tetanus shot today  Please continue all other medications as before, and refills have been done if requested.  Please have the pharmacy call with any other refills you may need.  Please continue your efforts at being more active, low cholesterol diet, and weight control.  You are otherwise up to date with prevention measures today.  Please keep your appointments with your specialists as you may have planned  Please go to the LAB in the Basement (turn left off the elevator) for the tests to be done today  You will be contacted by phone if any changes need to be made immediately.  Otherwise, you will receive a letter about your results with an explanation, but please check with MyChart first.  Please remember to sign up for MyChart if you have not done so, as this will be important to you in the future with finding out test results, communicating by private email, and scheduling acute appointments online when needed.  Please return in 6 months, or sooner if needed

## 2019-06-19 ENCOUNTER — Telehealth: Payer: Self-pay

## 2019-06-19 NOTE — Telephone Encounter (Signed)
Pt's wife  has been informed of results and expressed understanding.

## 2019-06-19 NOTE — Telephone Encounter (Signed)
-----   Message from Biagio Borg, MD sent at 06/17/2019 12:49 PM EDT ----- Left message on MyChart, pt to cont same tx except  The test results show that your current treatment is OK, as the tests are stable except for low Vitamin D level, and low Vitamin B12 level.  We need to: 1)  Please take Vitamin D 50000 units weekly for 12 weeks, then plan to change to OTC Vitamin D3 at 2000 units per day, indefinitely. 2)  Take oral B12 prescription I will send to the pharmacy as well    Shirron to please inform pt, I will do rx x 2

## 2019-06-20 ENCOUNTER — Encounter: Payer: Self-pay | Admitting: Internal Medicine

## 2019-06-20 NOTE — Assessment & Plan Note (Signed)
Stable, for f/u lab 

## 2019-06-20 NOTE — Assessment & Plan Note (Signed)

## 2019-07-06 ENCOUNTER — Other Ambulatory Visit: Payer: Self-pay | Admitting: Internal Medicine

## 2019-08-11 ENCOUNTER — Telehealth: Payer: Self-pay | Admitting: Internal Medicine

## 2019-08-11 NOTE — Telephone Encounter (Signed)
Patient's daughter, Neoma Laming, dropped off FMLA to be completed for her regarding her father [place]) and mother Geanie [place]) who are both patients of Dr Jenny Reichmann. She needs this to take them to their doctor appointments. This has been completed before. Please fax to [phone] when completed and mail a copy to the patient's home address.  Placed in Brittany's box for completion.

## 2019-08-11 NOTE — Telephone Encounter (Signed)
Forms have been completed & Placed in providers box to review and sign.  

## 2019-08-12 ENCOUNTER — Other Ambulatory Visit: Payer: Self-pay | Admitting: Internal Medicine

## 2019-08-13 DIAGNOSIS — Z0279 Encounter for issue of other medical certificate: Secondary | ICD-10-CM

## 2019-08-13 NOTE — Telephone Encounter (Signed)
Forms have been signed, Faxed to number below as requested. Copy sent to scan &Charged for.  Original mailed as requested.

## 2019-08-22 DIAGNOSIS — H2513 Age-related nuclear cataract, bilateral: Secondary | ICD-10-CM | POA: Diagnosis not present

## 2019-08-22 DIAGNOSIS — H40033 Anatomical narrow angle, bilateral: Secondary | ICD-10-CM | POA: Diagnosis not present

## 2019-09-14 ENCOUNTER — Other Ambulatory Visit: Payer: Self-pay | Admitting: Internal Medicine

## 2019-11-12 ENCOUNTER — Other Ambulatory Visit: Payer: Self-pay | Admitting: Internal Medicine

## 2019-11-12 NOTE — Telephone Encounter (Signed)
Please refill as per office routine med refill policy (all routine meds refilled for 3 mo or monthly per pt preference up to one year from last visit, then month to month grace period for 3 mo, then further med refills will have to be denied)  

## 2019-12-23 ENCOUNTER — Ambulatory Visit (INDEPENDENT_AMBULATORY_CARE_PROVIDER_SITE_OTHER): Payer: Medicare Other | Admitting: Internal Medicine

## 2019-12-23 ENCOUNTER — Other Ambulatory Visit: Payer: Self-pay

## 2019-12-23 ENCOUNTER — Encounter: Payer: Self-pay | Admitting: Internal Medicine

## 2019-12-23 VITALS — BP 136/70 | Temp 97.4°F | Ht 69.0 in | Wt 155.4 lb

## 2019-12-23 DIAGNOSIS — N189 Chronic kidney disease, unspecified: Secondary | ICD-10-CM | POA: Diagnosis not present

## 2019-12-23 DIAGNOSIS — E538 Deficiency of other specified B group vitamins: Secondary | ICD-10-CM

## 2019-12-23 DIAGNOSIS — I10 Essential (primary) hypertension: Secondary | ICD-10-CM | POA: Diagnosis not present

## 2019-12-23 DIAGNOSIS — E559 Vitamin D deficiency, unspecified: Secondary | ICD-10-CM

## 2019-12-23 LAB — CBC WITH DIFFERENTIAL/PLATELET
Basophils Absolute: 0 10*3/uL (ref 0.0–0.1)
Basophils Relative: 0.9 % (ref 0.0–3.0)
Eosinophils Absolute: 0.1 10*3/uL (ref 0.0–0.7)
Eosinophils Relative: 4 % (ref 0.0–5.0)
HCT: 32.4 % — ABNORMAL LOW (ref 39.0–52.0)
Hemoglobin: 10.9 g/dL — ABNORMAL LOW (ref 13.0–17.0)
Lymphocytes Relative: 33.4 % (ref 12.0–46.0)
Lymphs Abs: 1 10*3/uL (ref 0.7–4.0)
MCHC: 33.6 g/dL (ref 30.0–36.0)
MCV: 93.3 fl (ref 78.0–100.0)
Monocytes Absolute: 0.3 10*3/uL (ref 0.1–1.0)
Monocytes Relative: 11 % (ref 3.0–12.0)
Neutro Abs: 1.5 10*3/uL (ref 1.4–7.7)
Neutrophils Relative %: 50.7 % (ref 43.0–77.0)
Platelets: 77 10*3/uL — ABNORMAL LOW (ref 150.0–400.0)
RBC: 3.48 Mil/uL — ABNORMAL LOW (ref 4.22–5.81)
RDW: 15 % (ref 11.5–15.5)
WBC: 2.9 10*3/uL — ABNORMAL LOW (ref 4.0–10.5)

## 2019-12-23 LAB — BASIC METABOLIC PANEL
BUN: 33 mg/dL — ABNORMAL HIGH (ref 6–23)
CO2: 25 mEq/L (ref 19–32)
Calcium: 9.1 mg/dL (ref 8.4–10.5)
Chloride: 110 mEq/L (ref 96–112)
Creatinine, Ser: 2.43 mg/dL — ABNORMAL HIGH (ref 0.40–1.50)
GFR: 30.51 mL/min — ABNORMAL LOW (ref >60.00)
Glucose, Bld: 85 mg/dL (ref 70–99)
Potassium: 4.7 mEq/L (ref 3.5–5.1)
Sodium: 141 mEq/L (ref 135–145)

## 2019-12-23 LAB — VITAMIN D 25 HYDROXY (VIT D DEFICIENCY, FRACTURES): VITD: 31.65 ng/mL (ref 30.00–100.00)

## 2019-12-23 LAB — VITAMIN B12: Vitamin B-12: 1150 pg/mL — ABNORMAL HIGH (ref 211–911)

## 2019-12-23 NOTE — Assessment & Plan Note (Signed)
For oral replacement 

## 2019-12-23 NOTE — Patient Instructions (Signed)

## 2019-12-23 NOTE — Assessment & Plan Note (Signed)
stable overall by history and exam, recent data reviewed with pt, and pt to continue medical treatment as before,  to f/u any worsening symptoms or concerns  

## 2019-12-23 NOTE — Progress Notes (Signed)
Subjective:    Patient ID: Devin Hogan, male    DOB: 25-Sep-1928, 86 y.o.   MRN: [id number]  HPI  Here to f/u; overall doing ok,  Pt denies chest pain, increasing sob or doe, wheezing, orthopnea, PND, increased LE swelling, palpitations, dizziness or syncope.  Pt denies new neurological symptoms such as new headache, or facial or extremity weakness or numbness.  Pt denies polydipsia, polyuria, or low sugar episode.  Pt states overall good compliance with meds, mostly trying to follow appropriate diet, with wt overall stable,  but little exercise however.  No new compaints Past Medical History:  Diagnosis Date  . Acute gouty arthropathy 12/13/2010  . ALLERGIC RHINITIS 05/22/2007  . BENIGN PROSTATIC HYPERTROPHY 05/22/2007  . COLONIC POLYPS, HX OF 11/19/2007  . ERECTILE DYSFUNCTION 05/22/2007  . Gout 05/17/2011  . HYPERLIPIDEMIA 05/22/2007  . HYPERTENSION 05/22/2007  . RENAL INSUFFICIENCY 11/18/2009  . SKIN LESION 11/16/2010   No past surgical history on file.  reports that he has quit smoking. He has never used smokeless tobacco. He reports current alcohol use of about 17.0 standard drinks of alcohol per week. He reports that he does not use drugs. family history includes Lung cancer in his brother; Stroke in his sister. No Known Allergies Current Outpatient Medications on File Prior to Visit  Medication Sig Dispense Refill  . aspirin 81 MG EC tablet Take 81 mg by mouth daily.      [name] atenolol (TENORMIN) 25 MG tablet Take 1 tablet by mouth once daily 90 tablet 1  . CARTIA XT 240 MG 24 hr capsule Take 1 capsule by mouth once daily 90 capsule 1  . colchicine 0.6 MG tablet Take 1 tablet by mouth at the onset of flair (0.6 mg) then 1/2 tablet (0.3 mg) 1 hour later - do not repeat for 3 days. 2 tablet 0  . Cyanocobalamin (B-12) 1000 MCG TABS Take 1 tablet by mouth daily. 90 tablet 3  . Hypromellose (ARTIFICIAL TEARS OP) Place 1 drop into both eyes daily as needed (dry eyes).    [name] lovastatin (MEVACOR)  20 MG tablet Take 1 tablet by mouth once daily 90 tablet 2  . Vitamin D, Ergocalciferol, (DRISDOL) 1.25 MG (50000 UT) CAPS capsule Take 1 capsule (50,000 Units total) by mouth every 7 (seven) days. 12 capsule 0  . albuterol (PROVENTIL HFA;VENTOLIN HFA) 108 (90 Base) MCG/ACT inhaler Inhale 2 puffs into the lungs every 6 (six) hours as needed for wheezing or shortness of breath. (Patient not taking: Reported on 12/23/2019) 1 Inhaler 1  . [DISCONTINUED] diltiazem (DILACOR XR) 240 MG 24 hr capsule Take 1 capsule (240 mg total) by mouth daily. 30 capsule 11   No current facility-administered medications on file prior to visit.   Review of Systems All otherwise neg per pt     Objective:   Physical Exam BP 136/70 (BP Location: Left Arm, Patient Position: Sitting, Cuff Size: Normal)   Temp (!) 97.4 F (36.3 C) (Oral)   Ht 5\' 9"  (1.753 m)   Wt 155 lb 6.4 oz (70.5 kg)   BMI 22.95 kg/m  VS noted,  Constitutional: Pt appears in NAD HENT: Head: NCAT.  Right Ear: External ear normal.  Left Ear: External ear normal.  Eyes: . Pupils are equal, round, and reactive to light. Conjunctivae and EOM are normal Nose: without d/c or deformity Neck: Neck supple. Gross normal ROM Cardiovascular: Normal rate and regular rhythm.   Pulmonary/Chest: Effort normal and breath sounds without rales  or wheezing.  Abd:  Soft, NT, ND, + BS, no organomegaly Neurological: Pt is alert. At baseline orientation, motor grossly intact Skin: Skin is warm. No rashes, other new lesions, no LE edema Psychiatric: Pt behavior is normal without agitation  All otherwise neg per pt Lab Results  Component Value Date   WBC 2.9 (L) 12/23/2019   HGB 10.9 (L) 12/23/2019   HCT 32.4 (L) 12/23/2019   PLT 77.0 (L) 12/23/2019   GLUCOSE 85 12/23/2019   CHOL 144 06/17/2019   TRIG 65.0 06/17/2019   HDL 56.90 06/17/2019   LDLCALC 74 06/17/2019   ALT 5 06/17/2019   AST 11 06/17/2019   NA 141 12/23/2019   K 4.7 12/23/2019   CL 110  12/23/2019   CREATININE 2.43 (H) 12/23/2019   BUN 33 (H) 12/23/2019   CO2 25 12/23/2019   TSH 4.04 06/17/2019   PSA 0.63 05/12/2010          Assessment & Plan:

## 2019-12-23 NOTE — Assessment & Plan Note (Addendum)
stable overall by history and exam, recent data reviewed with pt, and pt to continue medical treatment as before,  to f/u any worsening symptoms or concerns  I spent 31 minutes in addition to time for wellness examination in preparing to see the patient by review of recent labs, imaging and procedures, obtaining and reviewing separately obtained history, communicating with the patient and family or caregiver, ordering medications, tests or procedures, and documenting clinical information in the EHR including the differential Dx, treatment, and any further evaluation and other management of HTN, CKD, vit d and b12 deficiency

## 2019-12-24 ENCOUNTER — Encounter: Payer: Self-pay | Admitting: Internal Medicine

## 2020-01-18 ENCOUNTER — Other Ambulatory Visit: Payer: Self-pay | Admitting: Internal Medicine

## 2020-01-18 NOTE — Telephone Encounter (Signed)
Please refill as per office routine med refill policy (all routine meds refilled for 3 mo or monthly per pt preference up to one year from last visit, then month to month grace period for 3 mo, then further med refills will have to be denied)  

## 2020-03-19 ENCOUNTER — Encounter (HOSPITAL_COMMUNITY): Payer: Self-pay | Admitting: Emergency Medicine

## 2020-03-19 ENCOUNTER — Emergency Department (HOSPITAL_COMMUNITY): Payer: Medicare Other

## 2020-03-19 ENCOUNTER — Other Ambulatory Visit: Payer: Self-pay

## 2020-03-19 ENCOUNTER — Emergency Department (HOSPITAL_COMMUNITY)
Admission: EM | Admit: 2020-03-19 | Discharge: 2020-03-19 | Disposition: A | Discharge status: Home / Self Care | Payer: Medicare Other | Attending: Emergency Medicine | Admitting: Emergency Medicine

## 2020-03-19 DIAGNOSIS — M25462 Effusion, left knee: Secondary | ICD-10-CM | POA: Diagnosis not present

## 2020-03-19 DIAGNOSIS — M10062 Idiopathic gout, left knee: Secondary | ICD-10-CM | POA: Diagnosis not present

## 2020-03-19 DIAGNOSIS — Z7982 Long term (current) use of aspirin: Secondary | ICD-10-CM | POA: Insufficient documentation

## 2020-03-19 DIAGNOSIS — R079 Chest pain, unspecified: Secondary | ICD-10-CM | POA: Diagnosis not present

## 2020-03-19 DIAGNOSIS — N189 Chronic kidney disease, unspecified: Secondary | ICD-10-CM | POA: Insufficient documentation

## 2020-03-19 DIAGNOSIS — R0789 Other chest pain: Secondary | ICD-10-CM | POA: Diagnosis not present

## 2020-03-19 DIAGNOSIS — M109 Gout, unspecified: Secondary | ICD-10-CM

## 2020-03-19 DIAGNOSIS — M25562 Pain in left knee: Secondary | ICD-10-CM | POA: Diagnosis not present

## 2020-03-19 DIAGNOSIS — Z87891 Personal history of nicotine dependence: Secondary | ICD-10-CM | POA: Insufficient documentation

## 2020-03-19 DIAGNOSIS — Z79899 Other long term (current) drug therapy: Secondary | ICD-10-CM | POA: Insufficient documentation

## 2020-03-19 DIAGNOSIS — R0602 Shortness of breath: Secondary | ICD-10-CM | POA: Diagnosis not present

## 2020-03-19 DIAGNOSIS — R1084 Generalized abdominal pain: Secondary | ICD-10-CM | POA: Diagnosis not present

## 2020-03-19 DIAGNOSIS — I129 Hypertensive chronic kidney disease with stage 1 through stage 4 chronic kidney disease, or unspecified chronic kidney disease: Secondary | ICD-10-CM | POA: Diagnosis not present

## 2020-03-19 LAB — BASIC METABOLIC PANEL
Anion gap: 12 (ref 5–15)
BUN: 32 mg/dL — ABNORMAL HIGH (ref 8–23)
CO2: 18 mmol/L — ABNORMAL LOW (ref 22–32)
Calcium: 9.3 mg/dL (ref 8.9–10.3)
Chloride: 112 mmol/L — ABNORMAL HIGH (ref 98–111)
Creatinine, Ser: 2.58 mg/dL — ABNORMAL HIGH (ref 0.61–1.24)
GFR calc Af Amer: 24 mL/min — ABNORMAL LOW (ref >60)
GFR calc non Af Amer: 21 mL/min — ABNORMAL LOW (ref >60)
Glucose, Bld: 91 mg/dL (ref 70–99)
Potassium: 4.8 mmol/L (ref 3.5–5.1)
Sodium: 142 mmol/L (ref 135–145)

## 2020-03-19 LAB — CBC WITH DIFFERENTIAL/PLATELET
Abs Immature Granulocytes: 0.01 10*3/uL (ref 0.00–0.07)
Basophils Absolute: 0.1 10*3/uL (ref 0.0–0.1)
Basophils Relative: 1 %
Eosinophils Absolute: 0 10*3/uL (ref 0.0–0.5)
Eosinophils Relative: 1 %
HCT: 34.6 % — ABNORMAL LOW (ref 39.0–52.0)
Hemoglobin: 10.7 g/dL — ABNORMAL LOW (ref 13.0–17.0)
Immature Granulocytes: 0 %
Lymphocytes Relative: 14 %
Lymphs Abs: 0.7 10*3/uL (ref 0.7–4.0)
MCH: 30.2 pg (ref 26.0–34.0)
MCHC: 30.9 g/dL (ref 30.0–36.0)
MCV: 97.7 fL (ref 80.0–100.0)
Monocytes Absolute: 0.6 10*3/uL (ref 0.1–1.0)
Monocytes Relative: 12 %
Neutro Abs: 3.5 10*3/uL (ref 1.7–7.7)
Neutrophils Relative %: 72 %
Platelets: 72 10*3/uL — ABNORMAL LOW (ref 150–400)
RBC: 3.54 MIL/uL — ABNORMAL LOW (ref 4.22–5.81)
RDW: 15 % (ref 11.5–15.5)
WBC: 4.8 10*3/uL (ref 4.0–10.5)
nRBC: 0 % (ref 0.0–0.2)

## 2020-03-19 LAB — SYNOVIAL CELL COUNT + DIFF, W/ CRYSTALS
Eosinophils-Synovial: 0 % (ref 0–1)
Lymphocytes-Synovial Fld: 5 % (ref 0–20)
Monocyte-Macrophage-Synovial Fluid: 6 % — ABNORMAL LOW (ref 50–90)
Neutrophil, Synovial: 89 % — ABNORMAL HIGH (ref 0–25)
WBC, Synovial: 25000 /mm3 — ABNORMAL HIGH (ref 0–200)

## 2020-03-19 LAB — CBG MONITORING, ED: Glucose-Capillary: 82 mg/dL (ref 70–99)

## 2020-03-19 MED ORDER — PREDNISONE 5 MG PO TABS: ORAL_TABLET | ORAL | 0 refills | Status: DC

## 2020-03-19 MED ORDER — LIDOCAINE-EPINEPHRINE (PF) 2 %-1:200000 IJ SOLN
10.0000 mL | Freq: Once | INTRAMUSCULAR | Status: AC
  Administered 2020-03-19: 10 mL via INTRADERMAL
  Filled 2020-03-19: qty 20

## 2020-03-19 NOTE — ED Provider Notes (Signed)
Signout note  86 year old atraumatic left knee pain and swelling.  Clinically most likely gout suspected, arthrocentesis performed to rule out septic knee.  At time of signout arthrocentesis results pending.  Received signout from Dr. Sherry Ruffing  I reviewed results, crystals present, 25,000 WBCs, no bacteria noted on Gram stain, these findings are most consistent with gout.  Culture has been sent.  Discussed treatment options with pharmacy given patient's CKD which would preclude NSAIDs.  Recommended course of steroids.  Updated patient on results, recommended recheck with his primary doctor this coming week. Remains well appearing, pain is well controlled.   Lucrezia Starch, MD 03/19/20 1924

## 2020-03-19 NOTE — ED Triage Notes (Signed)
Pt arrives via [facility] for c/o L knee pain with swelling present to knee and lower leg, denies any known injury. Reports pain has been ongoing x1 week. Pt a/ox4, resp e/u, nad.

## 2020-03-19 NOTE — ED Provider Notes (Signed)
[facility] Provider Note   CSN: [id number] Arrival date & time: 03/19/20  1233     History Chief Complaint  Patient presents with  . Knee Pain    Devin Hogan is a 86 y.o. male.  The history is provided by the patient, medical records and a relative. No language interpreter was used.  Knee Pain Location:  Knee Time since incident:  1 week Injury: no   Knee location:  L knee Pain details:    Quality:  Aching   Radiates to:  Does not radiate   Severity:  Severe   Onset quality:  Gradual   Duration:  1 week   Timing:  Constant   Progression:  Waxing and waning Chronicity:  New Dislocation: no   Foreign body present:  No foreign bodies Tetanus status:  Unknown Prior injury to area:  No Relieved by:  Nothing Worsened by:  Extension, flexion and bearing weight Ineffective treatments:  None tried Associated symptoms: swelling   Associated symptoms: no back pain, no fatigue, no fever, no itching, no muscle weakness, no neck pain, no numbness and no stiffness        Past Medical History:  Diagnosis Date  . Acute gouty arthropathy 12/13/2010  . ALLERGIC RHINITIS 05/22/2007  . BENIGN PROSTATIC HYPERTROPHY 05/22/2007  . COLONIC POLYPS, HX OF 11/19/2007  . ERECTILE DYSFUNCTION 05/22/2007  . Gout 05/17/2011  . HYPERLIPIDEMIA 05/22/2007  . HYPERTENSION 05/22/2007  . RENAL INSUFFICIENCY 11/18/2009  . SKIN LESION 11/16/2010    Patient Active Problem List   Diagnosis Date Noted  . Vitamin D deficiency 12/23/2019  . Vitamin B12 deficiency 12/23/2019  . Pancytopenia ([place]) 12/16/2018  . Cough 12/11/2017  . Fall 12/11/2017  . MGUS (monoclonal gammopathy of unknown significance) 06/06/2017  . Dizziness 03/27/2017  . Pyuria 12/07/2016  . Preventative health care 12/09/2015  . UTI (lower urinary tract infection) 05/31/2012  . Nocturia 05/31/2012  . Thrombocytopenia ([place]) 12/05/2011  . Gout 05/17/2011  . CKD (chronic kidney disease)  11/18/2009  . COLONIC POLYPS, HX OF 11/19/2007  . Hyperlipidemia 05/22/2007  . ERECTILE DYSFUNCTION 05/22/2007  . Essential hypertension 05/22/2007  . ALLERGIC RHINITIS 05/22/2007  . BENIGN PROSTATIC HYPERTROPHY 05/22/2007    History reviewed. No pertinent surgical history.     Family History  Problem Relation Age of Onset  . Lung cancer Brother   . Stroke Sister     Social History   Tobacco Use  . Smoking status: Former Research scientist (life sciences)  . Smokeless tobacco: Never Used  . Tobacco comment: quit when he was very yourg  Substance Use Topics  . Alcohol use: Yes    Alcohol/week: 17.0 standard drinks    Types: 4 Cans of beer, 1 Glasses of wine, 12 Shots of liquor per week    Comment: "couple of drinks per day" takes drink on the weekend  . Drug use: No    Home Medications Prior to Admission medications   Medication Sig Start Date End Date Taking? Authorizing Provider  albuterol (PROVENTIL HFA;VENTOLIN HFA) 108 (90 Base) MCG/ACT inhaler Inhale 2 puffs into the lungs every 6 (six) hours as needed for wheezing or shortness of breath. Patient not taking: Reported on 12/23/2019 12/11/17   Biagio Borg, MD  aspirin 81 MG EC tablet Take 81 mg by mouth daily.      [provider]  atenolol (TENORMIN) 25 MG tablet Take 1 tablet by mouth once daily 01/18/20   Biagio Borg, MD  CARTIA XT 240 MG 24 hr capsule Take 1 capsule by mouth once daily 09/14/19   Biagio Borg, MD  colchicine 0.6 MG tablet Take 1 tablet by mouth at the onset of flair (0.6 mg) then 1/2 tablet (0.3 mg) 1 hour later - do not repeat for 3 days. 08/24/15   Gold[place] Circle, FNP  Cyanocobalamin (B-12) 1000 MCG TABS Take 1 tablet by mouth daily. 06/17/19   Biagio Borg, MD  Hypromellose (ARTIFICIAL TEARS OP) Place 1 drop into both eyes daily as needed (dry eyes).    [provider]  lovastatin (MEVACOR) 20 MG tablet Take 1 tablet by mouth once daily 11/12/19   Biagio Borg, MD  Vitamin D, Ergocalciferol, (DRISDOL)  1.25 MG (50000 UT) CAPS capsule Take 1 capsule (50,000 Units total) by mouth every 7 (seven) days. 06/17/19   Biagio Borg, MD  diltiazem (DILACOR XR) 240 MG 24 hr capsule Take 1 capsule (240 mg total) by mouth daily. 08/13/11 09/12/12  Biagio Borg, MD    Allergies    Patient has no known allergies.  Review of Systems   Review of Systems  Constitutional: Negative for chills, diaphoresis, fatigue and fever.  HENT: Negative for congestion.   Respiratory: Negative for cough, chest tightness, shortness of breath, wheezing and stridor.   Cardiovascular: Negative for chest pain, palpitations and leg swelling.  Gastrointestinal: Negative for abdominal distention, constipation, diarrhea, nausea and vomiting.  Genitourinary: Negative for dysuria, flank pain and frequency.  Musculoskeletal: Positive for joint swelling. Negative for back pain, neck pain, neck stiffness and stiffness.  Skin: Negative for itching, rash and wound.  Neurological: Negative for light-headedness and headaches.  Psychiatric/Behavioral: Negative for agitation and confusion.  All other systems reviewed and are negative.   Physical Exam Updated Vital Signs BP (!) 152/73 (BP Location: Left Arm)   Pulse (!) 56   Temp 99 F (37.2 C) (Oral)   Resp 14   Ht 5\' 8"  (1.727 m)   Wt 76.7 kg   SpO2 98%   BMI 25.70 kg/m   Physical Exam Vitals and nursing note reviewed.  Constitutional:      General: He is not in acute distress.    Appearance: He is well-developed. He is not ill-appearing, toxic-appearing or diaphoretic.  HENT:     Head: Normocephalic and atraumatic.     Nose: No congestion or rhinorrhea.     Mouth/Throat:     Mouth: Mucous membranes are moist.     Pharynx: No oropharyngeal exudate or posterior oropharyngeal erythema.  Eyes:     Conjunctiva/sclera: Conjunctivae normal.     Pupils: Pupils are equal, round, and reactive to light.  Cardiovascular:     Rate and Rhythm: Normal rate and regular rhythm.      Heart sounds: No murmur.  Pulmonary:     Effort: Pulmonary effort is normal. No respiratory distress.     Breath sounds: Normal breath sounds.  Abdominal:     General: Abdomen is flat.     Palpations: Abdomen is soft.     Tenderness: There is no abdominal tenderness. There is no right CVA tenderness, left CVA tenderness, guarding or rebound.  Musculoskeletal:        General: Swelling and tenderness present. No deformity.     Cervical back: Neck supple.     Left knee: Swelling and effusion present. No erythema, ecchymosis or lacerations. Normal range of motion. Tenderness present.     Right lower leg: No edema.  Left lower leg: No edema.     Comments: Normal pulse, sensation and strength distal to knee.   Knee tenderness, swelling, and warmth but no erythema.   Skin:    General: Skin is warm and dry.     Findings: No erythema or rash.  Neurological:     General: No focal deficit present.     Mental Status: He is alert.     Sensory: No sensory deficit.     Motor: No weakness.  Psychiatric:        Mood and Affect: Mood normal.     ED Results / Procedures / Treatments   Labs (all labs ordered are listed, but only abnormal results are displayed) Labs Reviewed  CBC WITH DIFFERENTIAL/PLATELET - Abnormal; Notable for the following components:      Result Value   RBC 3.54 (*)    Hemoglobin 10.7 (*)    HCT 34.6 (*)    Platelets 72 (*)    All other components within normal limits  BASIC METABOLIC PANEL - Abnormal; Notable for the following components:   Chloride 112 (*)    CO2 18 (*)    BUN 32 (*)    Creatinine, Ser 2.58 (*)    GFR calc non Af Amer 21 (*)    GFR calc Af Amer 24 (*)    All other components within normal limits  BODY FLUID CULTURE  SYNOVIAL CELL COUNT + DIFF, W/ CRYSTALS  GLUCOSE, BODY FLUID OTHER  CBG MONITORING, ED    EKG None  Radiology DG Knee Complete 4 Views Left  Result Date: 03/19/2020 CLINICAL DATA:  Left knee pain for 1 week. No  known injury EXAM: LEFT KNEE - COMPLETE 4+ VIEW COMPARISON:  None. FINDINGS: No evidence of fracture or dislocation. Moderate knee joint effusion. Joint spaces are preserved. No evidence of arthropathy or other focal bone abnormality. Soft tissues are unremarkable. IMPRESSION: 1. Moderate left knee joint effusion, nonspecific. 2. No acute osseous abnormality. Electronically Signed   By: Davina Poke D.O.   On: 03/19/2020 13:23    Procedures .Joint Aspiration/Arthrocentesis  Date/Time: 03/19/2020 3:47 PM Performed by: Courtney Paris, MD Authorized by: Courtney Paris, MD   Consent:    Consent obtained:  Verbal   Consent given by:  Patient   Risks discussed:  Bleeding, infection, incomplete drainage, pain and nerve damage Location:    Location:  Knee   Knee:  L knee Anesthesia (see MAR for exact dosages):    Anesthesia method:  Local infiltration   Local anesthetic:  Lidocaine 1% WITH epi Procedure details:    Preparation: Patient was prepped and draped in usual sterile fashion     Needle gauge:  18 G   Ultrasound guidance: no     Approach:  Lateral   Aspirate amount:  100   Aspirate characteristics:  Yellow   Steroid injected: no     Specimen collected: yes   Post-procedure details:    Dressing:  Adhesive bandage   Patient tolerance of procedure:  Tolerated well, no immediate complications   (including critical care time)  Medications Ordered in ED Medications  lidocaine-EPINEPHrine (XYLOCAINE W/EPI) 2 %-1:200000 (PF) injection 10 mL (has no administration in time range)    ED Course  I have reviewed the triage vital signs and the nursing notes.  Pertinent labs & imaging results that were available during my care of the patient were reviewed by me and considered in my medical decision making (see chart for details).  MDM Rules/Calculators/A&P                      Devin Hogan is a 86 y.o. male with a past medical history significant for  hypertension, hyperlipidemia, remote history of gout, MGUS, and previous pancytopenia who presents with left knee swelling and pain.  Patient reports over the last week, he has had severe pain and swelling in his left knee without any trauma.  He denies fevers, chills, chest pain, shortness of breath.  He denies cough, urinary symptoms or GI symptoms.  He denies any other symptoms aside from severe left knee pain.  Is worse when he tries to ambulate or bend it.  He thinks it may feel slightly warm but it is not looking erythematous.  He reports he had gout in his hand years ago but has never had any symptoms.  His vital signs on arrival show temperature of 99 but he is otherwise not having tachycardia or tachypnea.  He otherwise appears well.  On exam, lungs are clear and chest is nontender.  Abdomen is nontender.  Patient is tenderness and swelling of the left knee with a marked effusion seen on exam.  There is some warmth but there is no erythema seen.  Good sensation, strength, and pulses distally.  He is tender on the knee and has pain with bending both passively and actively.  Patient had x-ray in triage showing no fracture dislocation but did show a joint effusion of unclear etiology.  Had a long decision-making conversation with patient and family and we agreed to do a knee aspiration to look for gout versus septic arthritis versus inflammatory arthritis.  Aspiration took place without difficulty and a yellow fluid was removed.  Patient had approximately 100 cc of fluid removed from his knee with significant provement in his pain after aspiration.  A sterile cath was utilized and the aspirate was sent to the lab for evaluation.  Patient also had some screening labs otherwise.  Care transferred to Dr. Roslynn Amble while waiting for results of knee aspirate.  If there is septic arthritis, would likely start antibiotics and admit.  If there is just gout or other noninfected inflammatory throat is,  anticipate discharge home.  Care transferred in stable condition and patient has significantly improvement in pain after aspiration.       Final Clinical Impression(s) / ED Diagnoses Final diagnoses:  Acute pain of left knee  Effusion of left knee    Clinical Impression: 1. Acute pain of left knee   2. Effusion of left knee     Disposition: Care transferred to Dr. Roslynn Amble while waiting for results of knee aspirate.  This note was prepared with assistance of Systems analyst. Occasional wrong-word or sound-a-like substitutions may have occurred due to the inherent limitations of voice recognition software.     Zacarias Krauter, Gwenyth Allegra, MD 03/19/20 1623

## 2020-03-19 NOTE — Discharge Instructions (Signed)
Please schedule follow-up appointment for recheck with your primary doctor this coming week.  Please take the steroids as prescribed for your gout flare.  If you develop worsening swelling, redness, especially any fever, please return to the emergency room for reevaluation at that time.  Continue tylenol as needed for further pain.

## 2020-03-21 LAB — GLUCOSE, BODY FLUID OTHER: Glucose, Body Fluid Other: 69 mg/dL

## 2020-03-22 LAB — BODY FLUID CULTURE: Culture: NO GROWTH

## 2020-03-24 ENCOUNTER — Ambulatory Visit: Payer: Medicare Other

## 2020-03-24 ENCOUNTER — Encounter: Payer: Self-pay | Admitting: Internal Medicine

## 2020-03-24 ENCOUNTER — Telehealth: Payer: Self-pay | Admitting: Internal Medicine

## 2020-03-24 ENCOUNTER — Other Ambulatory Visit: Payer: Self-pay

## 2020-03-24 ENCOUNTER — Ambulatory Visit (INDEPENDENT_AMBULATORY_CARE_PROVIDER_SITE_OTHER): Payer: Medicare Other | Admitting: Internal Medicine

## 2020-03-24 VITALS — BP 110/60 | HR 49 | Temp 98.4°F | Ht 68.0 in

## 2020-03-24 DIAGNOSIS — M25562 Pain in left knee: Secondary | ICD-10-CM

## 2020-03-24 DIAGNOSIS — I10 Essential (primary) hypertension: Secondary | ICD-10-CM | POA: Diagnosis not present

## 2020-03-24 DIAGNOSIS — M25462 Effusion, left knee: Secondary | ICD-10-CM | POA: Insufficient documentation

## 2020-03-24 DIAGNOSIS — N189 Chronic kidney disease, unspecified: Secondary | ICD-10-CM

## 2020-03-24 MED ORDER — HYDROCODONE-ACETAMINOPHEN 5-325 MG PO TABS: 1.0000 | ORAL_TABLET | Freq: Four times a day (QID) | ORAL | 0 refills | Status: DC | PRN

## 2020-03-24 NOTE — Patient Instructions (Signed)
Please take all new medication as prescribed - the pain medication  Please continue all other medications as before, and refills have been done if requested.  Please have the pharmacy call with any other refills you may need.  Please keep your appointments with your specialists as you may have planned  You will be contacted regarding the referral for: Sports Medicine

## 2020-03-24 NOTE — Progress Notes (Addendum)
Subjective:    Patient ID: Devin Hogan, male    DOB: 1927/12/16, 86 y.o.   MRN: [id number]  HPI  Here to f/u; overall doing ok,  Pt denies chest pain, increasing sob or doe, wheezing, orthopnea, PND, increased LE swelling, palpitations, dizziness or syncope.  Pt denies new neurological symptoms such as new headache, or facial or extremity weakness or numbness.  Pt denies polydipsia, polyuria, or low sugar episode.  Pt states overall good compliance with meds, mostly trying to follow appropriate diet, with wt overall stable,  But has had worsening left knee pain and swelling without fever, trauma. Seen in ED with tx with prednisone but has not helped last 4 days.   Past Medical History:  Diagnosis Date  . Acute gouty arthropathy 12/13/2010  . ALLERGIC RHINITIS 05/22/2007  . BENIGN PROSTATIC HYPERTROPHY 05/22/2007  . COLONIC POLYPS, HX OF 11/19/2007  . ERECTILE DYSFUNCTION 05/22/2007  . Gout 05/17/2011  . HYPERLIPIDEMIA 05/22/2007  . HYPERTENSION 05/22/2007  . RENAL INSUFFICIENCY 11/18/2009  . SKIN LESION 11/16/2010   History reviewed. No pertinent surgical history.  reports that he has quit smoking. He has never used smokeless tobacco. He reports current alcohol use of about 17.0 standard drinks of alcohol per week. He reports that he does not use drugs. family history includes Lung cancer in his brother; Stroke in his sister. No Known Allergies Current Outpatient Medications on File Prior to Visit  Medication Sig Dispense Refill  . aspirin 81 MG EC tablet Take 81 mg by mouth daily.      [name] atenolol (TENORMIN) 25 MG tablet Take 1 tablet by mouth once daily 90 tablet 0  . CARTIA XT 240 MG 24 hr capsule Take 1 capsule by mouth once daily 90 capsule 1  . colchicine 0.6 MG tablet Take 1 tablet by mouth at the onset of flair (0.6 mg) then 1/2 tablet (0.3 mg) 1 hour later - do not repeat for 3 days. 2 tablet 0  . Cyanocobalamin (B-12) 1000 MCG TABS Take 1 tablet by mouth daily. 90 tablet 3  .  Hypromellose (ARTIFICIAL TEARS OP) Place 1 drop into both eyes daily as needed (dry eyes).    [name] lovastatin (MEVACOR) 20 MG tablet Take 1 tablet by mouth once daily 90 tablet 2  . predniSONE (DELTASONE) 5 MG tablet Take 6 tabs by mouth daily for 1 day, then 5 tabs by mouth daily for 1 day,  then 4 tabs by mouth daily for 1 day, then 3 tabs by mouth daily for 1 day,  then 2 tabs by mouth daily for 1 day, then 1 tab by mouth daily for 1 day 21 tablet 0  . Vitamin D, Ergocalciferol, (DRISDOL) 1.25 MG (50000 UT) CAPS capsule Take 1 capsule (50,000 Units total) by mouth every 7 (seven) days. 12 capsule 0  . [DISCONTINUED] diltiazem (DILACOR XR) 240 MG 24 hr capsule Take 1 capsule (240 mg total) by mouth daily. 30 capsule 11   No current facility-administered medications on file prior to visit.   Review of Systems All otherwise neg per pt    Objective:   Physical Exam BP 110/60 (BP Location: Left Arm, Patient Position: Sitting, Cuff Size: Large)   Pulse (!) 49   Temp 98.4 F (36.9 C) (Oral)   Ht 5\' 8"  (1.727 m)   SpO2 98%   BMI 25.70 kg/m  VS noted,  Constitutional: Pt appears in NAD HENT: Head: NCAT.  Right Ear: External ear normal.  Left Ear:  External ear normal.  Eyes: . Pupils are equal, round, and reactive to light. Conjunctivae and EOM are normal Nose: without d/c or deformity Neck: Neck supple. Gross normal ROM Cardiovascular: Normal rate and regular rhythm.   Pulmonary/Chest: Effort normal and breath sounds without rales or wheezing.  Left knee with 2+ effusion, warm but mild tender at best Neurological: Pt is alert. At baseline orientation, motor grossly intact Skin: Skin is warm. No rashes, other new lesions, no LE edema Psychiatric: Pt behavior is normal without agitation  All otherwise neg per pt Lab Results  Component Value Date   WBC 4.8 03/19/2020   HGB 10.7 (L) 03/19/2020   HCT 34.6 (L) 03/19/2020   PLT 72 (L) 03/19/2020   GLUCOSE 91 03/19/2020   CHOL 144  06/17/2019   TRIG 65.0 06/17/2019   HDL 56.90 06/17/2019   LDLCALC 74 06/17/2019   ALT 5 06/17/2019   AST 11 06/17/2019   NA 142 03/19/2020   K 4.8 03/19/2020   CL 112 (H) 03/19/2020   CREATININE 2.58 (H) 03/19/2020   BUN 32 (H) 03/19/2020   CO2 18 (L) 03/19/2020   TSH 4.04 06/17/2019   PSA 0.63 05/12/2010      Assessment & Plan:

## 2020-03-24 NOTE — Assessment & Plan Note (Addendum)
Not impoved with steroid, culture neg, difficutl to walk, high risk fall - for hydrocodone prn, refer sports med for possible knee u/s and cortisone  I spent 31 minutes in preparing to see the patient by review of recent labs, imaging and procedures, obtaining and reviewing separately obtained history, communicating with the patient and family or caregiver, ordering medications, tests or procedures, and documenting clinical information in the EHR including the differential Dx, treatment, and any further evaluation and other management of left knee pain swelling, ckd, htn

## 2020-03-24 NOTE — Telephone Encounter (Signed)
   [facility] calling to inform staff for the first order of HYDROcodone-acetaminophen (NORCO/VICODIN) 5-325 MG tablet, they only do a 5 day supply

## 2020-03-26 ENCOUNTER — Encounter: Payer: Self-pay | Admitting: Internal Medicine

## 2020-03-26 NOTE — Assessment & Plan Note (Signed)
stable overall by history and exam, recent data reviewed with pt, and pt to continue medical treatment as before,  to f/u any worsening symptoms or concerns  

## 2020-03-31 ENCOUNTER — Ambulatory Visit: Payer: Self-pay

## 2020-03-31 ENCOUNTER — Ambulatory Visit: Payer: Medicare Other | Admitting: Family Medicine

## 2020-03-31 ENCOUNTER — Encounter: Payer: Self-pay | Admitting: Family Medicine

## 2020-03-31 ENCOUNTER — Other Ambulatory Visit: Payer: Self-pay

## 2020-03-31 VITALS — BP 120/68 | HR 52 | Ht 68.0 in | Wt 169.0 lb

## 2020-03-31 DIAGNOSIS — M10062 Idiopathic gout, left knee: Secondary | ICD-10-CM

## 2020-03-31 DIAGNOSIS — M25562 Pain in left knee: Secondary | ICD-10-CM

## 2020-03-31 LAB — URIC ACID: Uric Acid, Serum: 10 mg/dL — ABNORMAL HIGH (ref 4.0–7.8)

## 2020-03-31 MED ORDER — HYDROCODONE-ACETAMINOPHEN 5-325 MG PO TABS: 1.0000 | ORAL_TABLET | Freq: Four times a day (QID) | ORAL | 0 refills | Status: DC | PRN

## 2020-03-31 MED ORDER — ALLOPURINOL 100 MG PO TABS
50.0000 mg | ORAL_TABLET | Freq: Every day | ORAL | 1 refills | Status: DC
Start: 2020-03-31 — End: 2020-04-06

## 2020-03-31 MED ORDER — COLCHICINE 0.6 MG PO TABS: 0.3000 mg | ORAL_TABLET | Freq: Every day | ORAL | 2 refills | Status: AC | PRN

## 2020-03-31 NOTE — Patient Instructions (Signed)
Thank you for coming in today. Take the colchicine 1/2 pill daily as needed for gout.  I think this medicine will be safer than other medicines for your.  Ok to use hydrocodone for severe pain.  Get labs today. I may be adding a medicine to take daily to lower uric acid and prevent gout entirely.     Gout  Gout is painful swelling of your joints. Gout is a type of arthritis. It is caused by having too much uric acid in your body. Uric acid is a chemical that is made when your body breaks down substances called purines. If your body has too much uric acid, sharp crystals can form and build up in your joints. This causes pain and swelling. Gout attacks can happen quickly and be very painful (acute gout). Over time, the attacks can affect more joints and happen more often (chronic gout). What are the causes?  Too much uric acid in your blood. This can happen because: ? Your kidneys do not remove enough uric acid from your blood. ? Your body makes too much uric acid. ? You eat too many foods that are high in purines. These foods include organ meats, some seafood, and beer.  Trauma or stress. What increases the risk?  Having a family history of gout.  Being male and middle-aged.  Being male and having gone through menopause.  Being very overweight (obese).  Drinking alcohol, especially beer.  Not having enough water in the body (being dehydrated).  Losing weight too quickly.  Having an organ transplant.  Having lead poisoning.  Taking certain medicines.  Having kidney disease.  Having a skin condition called psoriasis. What are the signs or symptoms? An attack of acute gout usually happens in just one joint. The most common place is the big toe. Attacks often start at night. Other joints that may be affected include joints of the feet, ankle, knee, fingers, wrist, or elbow. Symptoms of an attack may include:  Very bad pain.  Warmth.  Swelling.  Stiffness.  Shiny,  red, or purple skin.  Tenderness. The affected joint may be very painful to touch.  Chills and fever. Chronic gout may cause symptoms more often. More joints may be involved. You may also have white or yellow lumps (tophi) on your hands or feet or in other areas near your joints. How is this treated?  Treatment for this condition has two phases: treating an acute attack and preventing future attacks.  Acute gout treatment may include: ? NSAIDs. ? Steroids. These are taken by mouth or injected into a joint. ? Colchicine. This medicine relieves pain and swelling. It can be given by mouth or through an IV tube.  Preventive treatment may include: ? Taking small doses of NSAIDs or colchicine daily. ? Using a medicine that reduces uric acid levels in your blood. ? Making changes to your diet. You may need to see a food expert (dietitian) about what to eat and drink to prevent gout. Follow these instructions at home: During a gout attack   If told, put ice on the painful area: ? Put ice in a plastic bag. ? Place a towel between your skin and the bag. ? Leave the ice on for 20 minutes, 2-3 times a day.  Raise (elevate) the painful joint above the level of your heart as often as you can.  Rest the joint as much as possible. If the joint is in your leg, you may be given crutches.  Follow  instructions from your doctor about what you cannot eat or drink. Avoiding future gout attacks  Eat a low-purine diet. Avoid foods and drinks such as: ? Liver. ? Kidney. ? Anchovies. ? Asparagus. ? Herring. ? Mushrooms. ? Mussels. ? Beer.  Stay at a healthy weight. If you want to lose weight, talk with your doctor. Do not lose weight too fast.  Start or continue an exercise plan as told by your doctor. Eating and drinking  Drink enough fluids to keep your pee (urine) pale yellow.  If you drink alcohol: ? Limit how much you use to:  0-1 drink a day for women.  0-2 drinks a day for  men. ? Be aware of how much alcohol is in your drink. In the [place] one drink equals one 12 oz bottle of beer (355 mL), one 5 oz glass of wine (148 mL), or one 1 oz glass of hard liquor (44 mL). General instructions  Take over-the-counter and prescription medicines only as told by your doctor.  Do not drive or use heavy machinery while taking prescription pain medicine.  Return to your normal activities as told by your doctor. Ask your doctor what activities are safe for you.  Keep all follow-up visits as told by your doctor. This is important. Contact a doctor if:  You have another gout attack.  You still have symptoms of a gout attack after 10 days of treatment.  You have problems (side effects) because of your medicines.  You have chills or a fever.  You have burning pain when you pee (urinate).  You have pain in your lower back or belly. Get help right away if:  You have very bad pain.  Your pain cannot be controlled.  You cannot pee. Summary  Gout is painful swelling of the joints.  The most common site of pain is the big toe, but it can affect other joints.  Medicines and avoiding some foods can help to prevent and treat gout attacks. This information is not intended to replace advice given to you by your health care provider. Make sure you discuss any questions you have with your health care provider. Document Revised: 05/14/2018 Document Reviewed: 05/14/2018 Elsevier Patient Education  [employer].

## 2020-03-31 NOTE — Progress Notes (Signed)
Uric acid is very elevated at 10.  I will start allopurinol.  50 mg daily to lower uric acid.

## 2020-03-31 NOTE — Progress Notes (Signed)
Subjective:    CC: L knee pain and swelling  I, Molly Weber, LAT, ATC, am serving as scribe for Dr. Lynne Leader.  HPI: Pt is an 86 y/o male presenting w/ c/o L knee pain and swelling x approximately one month.  He was initially seen at the [facility] ED w/ these symptoms on 03/19/20 and had labs and a L knee XR.  His labs revealed that he has intracellular monosodium urate crystals and he was prescribed prednisone and advised to f/u w/ PCP.  Since then, pt reports he con't to have L knee pain and swelling.  Radiating pain: yes into his L lower leg L knee swelling: Yes L knee mechanical symptoms: No Aggravating factors: walking and weight bearing through the L LE Treatments tried: prednisone; Blue Emu; hydrocodone  Diagnostic testing: L knee XR- 03/19/20  Pertinent review of Systems: No fevers or chills  Relevant historical information: MGUS history of gout, pancytopenia, CKD    Objective:    Vitals:   03/31/20 1105  BP: 120/68  Pulse: (!) 52  SpO2: 98%   General: Well Developed, well nourished, and in no acute distress.   MSK: Left knee mild effusion otherwise normal-appearing nontender with no erythema.  Decreased range of motion.  Lab and Radiology Results DG Knee Complete 4 Views Left  Result Date: 03/19/2020 CLINICAL DATA:  Left knee pain for 1 week. No known injury EXAM: LEFT KNEE - COMPLETE 4+ VIEW COMPARISON:  None. FINDINGS: No evidence of fracture or dislocation. Moderate knee joint effusion. Joint spaces are preserved. No evidence of arthropathy or other focal bone abnormality. Soft tissues are unremarkable. IMPRESSION: 1. Moderate left knee joint effusion, nonspecific. 2. No acute osseous abnormality. Electronically Signed   By: Davina Poke D.O.   On: 03/19/2020 13:23   I, Lynne Leader, personally (independently) visualized and performed the interpretation of the images attached in this note.  Recent Results (from the past 2160 hour(s))  Body fluid  culture     Status: None   Collection Time: 03/19/20  2:23 PM   Specimen: Synovium; Body Fluid  Result Value Ref Range   Specimen Description SYNOVIAL FLUID    Special Requests LEFT KNEE    Gram Stain      FEW WBC PRESENT, PREDOMINANTLY PMN NO ORGANISMS SEEN    Culture      NO GROWTH Performed at [facility], [address] [place], [place] [address]    Report Status 03/22/2020 FINAL   CBC with Differential     Status: Abnormal   Collection Time: 03/19/20  2:45 PM  Result Value Ref Range   WBC 4.8 4.0 - 10.5 K/uL   RBC 3.54 (L) 4.22 - 5.81 MIL/uL   Hemoglobin 10.7 (L) 13.0 - 17.0 g/dL   HCT 34.6 (L) 39.0 - 52.0 %   MCV 97.7 80.0 - 100.0 fL   MCH 30.2 26.0 - 34.0 pg   MCHC 30.9 30.0 - 36.0 g/dL   RDW 15.0 11.5 - 15.5 %   Platelets 72 (L) 150 - 400 K/uL    Comment: SPECIMEN CHECKED FOR CLOTS Immature Platelet Fraction may be clinically indicated, consider ordering this additional test [id number] PLATELET COUNT CONFIRMED BY SMEAR REPEATED TO VERIFY    nRBC 0.0 0.0 - 0.2 %   Neutrophils Relative % 72 %   Neutro Abs 3.5 1.7 - 7.7 K/uL   Lymphocytes Relative 14 %   Lymphs Abs 0.7 0.7 - 4.0 K/uL   Monocytes  Relative 12 %   Monocytes Absolute 0.6 0.1 - 1.0 K/uL   Eosinophils Relative 1 %   Eosinophils Absolute 0.0 0.0 - 0.5 K/uL   Basophils Relative 1 %   Basophils Absolute 0.1 0.0 - 0.1 K/uL   Immature Granulocytes 0 %   Abs Immature Granulocytes 0.01 0.00 - 0.07 K/uL    Comment: Performed at [facility] [address], [place], [place] [address]  Basic metabolic panel     Status: Abnormal   Collection Time: 03/19/20  2:45 PM  Result Value Ref Range   Sodium 142 135 - 145 mmol/L   Potassium 4.8 3.5 - 5.1 mmol/L   Chloride 112 (H) 98 - 111 mmol/L   CO2 18 (L) 22 - 32 mmol/L   Glucose, Bld 91 70 - 99 mg/dL    Comment: Glucose reference range applies only to samples taken after fasting for at least 8 hours.   BUN 32 (H) 8 - 23 mg/dL   Creatinine, Ser  2.58 (H) 0.61 - 1.24 mg/dL   Calcium 9.3 8.9 - 10.3 mg/dL   GFR calc non Af Amer 21 (L) >60 mL/min   GFR calc Af Amer 24 (L) >60 mL/min   Anion gap 12 5 - 15    Comment: Performed at [facility] [address], [place], [place] [address]  CBG monitoring, ED     Status: None   Collection Time: 03/19/20  2:45 PM  Result Value Ref Range   Glucose-Capillary 82 70 - 99 mg/dL    Comment: Glucose reference range applies only to samples taken after fasting for at least 8 hours.   Comment 1 Notify RN    Comment 2 Document in Chart   Cell count + diff,  w/ cryst-synvl fld     Status: Abnormal   Collection Time: 03/19/20  3:48 PM  Result Value Ref Range   Color, Synovial YELLOW YELLOW   Appearance-Synovial TURBID (A) CLEAR   Crystals, Fluid INTRACELLULAR MONOSODIUM URATE CRYSTALS    WBC, Synovial 25,000 (H) 0 - 200 /cu mm   Neutrophil, Synovial 89 (H) 0 - 25 %   Lymphocytes-Synovial Fld 5 0 - 20 %   Monocyte-Macrophage-Synovial Fluid 6 (L) 50 - 90 %   Eosinophils-Synovial 0 0 - 1 %    Comment: Performed at [facility] [address], [place], [place] [address]  Glucose, Body Fluid Other     Status: None   Collection Time: 03/19/20  3:48 PM  Result Value Ref Range   Glucose, Body Fluid Other 69 mg/dL    Comment: (NOTE)                            _________________________________________                           : BODY FLUID TYPE :        GLUCOSE        :                           :_________________:_______________________:                           : Amniotic Fluid  :        45 - 76        :                           :  _________________:_______________________:                           : Bile, Clear     :            < 5        :                           :_________________:_______________________:                           : Bile, Yellow    :            < 8        :                           :_________________:_______________________:                           : Lymph            :        48 - 200       :                           :_________________:_______________________:                           : Nasal Secretion :            < 10       :                           :_________________:_______________________:                           : Pleural Fluid   :        65 -  99       :                            :_________________:_______________________:                           : Saliva          :            <  2       :                           : (Mixed Glands)  :                       :                           :_________________:_______________________:                           : Sweat           :            <  7       :                           :  _________________:_______________________:                           : Synovial Fluid  :        65 -  99       :                           :_________________:_______________________:                           : Tears           :        76 - 288       :                           :_________________:_______________________:                            Louanne Skye, Ehrhardt V. Reference Intervals                            for Adults and Children 2008. Ninth                            edition (V9.1) [place],                            [place]; [place]: July 2009. The reference intervals and  other method performance specifications have not been established for this test. The test result should be integrated into the clinical context for interpretation. Performed At: [facility] [place], [place] [id number] Rush Farmer MD XB:[id number]    Source of Sample SYN     Comment: Performed at [facility], [place] [address], [place], [place] [address]       Impression and Recommendations:    Assessment and Plan: 86 y.o. male with left knee pain and effusion.  Joint aspirate in emergency room indicates gout as cause of pain.  He still having some dysfunction and pain.  Plan to  transition to colchicine and continue hydrocodone.  Will decrease colchicine dose to 0.3 mg daily as needed given CKD.  We will go ahead and check uric acid as well today.  Likely will be adding allopurinol after uric acid results are back.  Offered joint steroid injection today patient declined opting to try colchicine first.  If not improving or unable to access colchicine will proceed with joint injection which should help.[name]  PDMP reviewed during this encounter. Orders Placed This Encounter  Procedures  . Uric acid    Standing Status:   Future    Number of Occurrences:   1    Standing Expiration Date:   03/31/2021   Meds ordered this encounter  Medications  . colchicine 0.6 MG tablet    Sig: Take 0.5 tablets (0.3 mg total) by mouth daily as needed (gout flair).    Dispense:  30 tablet    Refill:  2  . HYDROcodone-acetaminophen (NORCO/VICODIN) 5-325 MG tablet    Sig: Take 1 tablet by mouth every 6 (six) hours as needed for moderate pain.    Dispense:  15 tablet    Refill:  0    Discussed warning signs or symptoms. Please see discharge instructions. Patient expresses understanding.   The above documentation has been reviewed and is accurate and complete Lynne Leader, M.D.

## 2020-03-31 NOTE — Addendum Note (Signed)
Addended by: Gregor Hams on: 03/31/2020 04:37 PM   Modules accepted: Orders

## 2020-04-06 ENCOUNTER — Telehealth: Payer: Self-pay | Admitting: Family Medicine

## 2020-04-06 ENCOUNTER — Other Ambulatory Visit: Payer: Self-pay | Admitting: *Deleted

## 2020-04-06 MED ORDER — ALLOPURINOL 100 MG PO TABS: 50.0000 mg | ORAL_TABLET | Freq: Every day | ORAL | 1 refills | Status: DC

## 2020-04-06 NOTE — Telephone Encounter (Signed)
Patient's wife called stating that the pharmacy had not received the prescription for allopurinol (ZYLOPRIM) 100 MG tablet. Can this be resent please?  Pharmacy: [place], [place]

## 2020-04-06 NOTE — Telephone Encounter (Signed)
Resent allopurinol into pharmacy.

## 2020-04-07 MED ORDER — ALLOPURINOL 100 MG PO TABS: 50.0000 mg | ORAL_TABLET | Freq: Every day | ORAL | 1 refills | Status: AC

## 2020-04-07 NOTE — Telephone Encounter (Signed)
Medication sent again to [facility]

## 2020-04-07 NOTE — Telephone Encounter (Signed)
Pt wife called, said her children went to [facility], but no meds. I called Ryan at the Pharmacy and confirmed the Allopurinol was picked up on 5/27 and that 2 others meds were ready for pickup. Confirmed pharmacy location with pharmacy and patient, pt wife will try again.

## 2020-04-07 NOTE — Telephone Encounter (Signed)
Informed patients wife

## 2020-04-17 ENCOUNTER — Other Ambulatory Visit: Payer: Self-pay | Admitting: Internal Medicine

## 2020-04-17 NOTE — Telephone Encounter (Signed)
Please refill as per office routine med refill policy (all routine meds refilled for 3 mo or monthly per pt preference up to one year from last visit, then month to month grace period for 3 mo, then further med refills will have to be denied)  

## 2020-05-03 ENCOUNTER — Ambulatory Visit (INDEPENDENT_AMBULATORY_CARE_PROVIDER_SITE_OTHER): Payer: Medicare Other | Admitting: Family

## 2020-05-03 ENCOUNTER — Encounter: Payer: Self-pay | Admitting: Family

## 2020-05-03 ENCOUNTER — Other Ambulatory Visit: Payer: Self-pay

## 2020-05-03 VITALS — BP 108/58 | HR 54 | Temp 98.9°F | Ht 68.0 in | Wt 148.0 lb

## 2020-05-03 DIAGNOSIS — R634 Abnormal weight loss: Secondary | ICD-10-CM

## 2020-05-03 DIAGNOSIS — D649 Anemia, unspecified: Secondary | ICD-10-CM

## 2020-05-03 DIAGNOSIS — R600 Localized edema: Secondary | ICD-10-CM | POA: Diagnosis not present

## 2020-05-03 DIAGNOSIS — R194 Change in bowel habit: Secondary | ICD-10-CM

## 2020-05-03 LAB — CBC WITH DIFFERENTIAL/PLATELET
Basophils Absolute: 0.1 10*3/uL (ref 0.0–0.1)
Basophils Relative: 2.2 % (ref 0.0–3.0)
Eosinophils Absolute: 0.1 10*3/uL (ref 0.0–0.7)
Eosinophils Relative: 2.6 % (ref 0.0–5.0)
HCT: 29 % — ABNORMAL LOW (ref 39.0–52.0)
Hemoglobin: 9.8 g/dL — ABNORMAL LOW (ref 13.0–17.0)
Lymphocytes Relative: 27.7 % (ref 12.0–46.0)
Lymphs Abs: 0.8 10*3/uL (ref 0.7–4.0)
MCHC: 33.6 g/dL (ref 30.0–36.0)
MCV: 93.7 fl (ref 78.0–100.0)
Monocytes Absolute: 0.4 10*3/uL (ref 0.1–1.0)
Monocytes Relative: 12.6 % — ABNORMAL HIGH (ref 3.0–12.0)
Neutro Abs: 1.6 10*3/uL (ref 1.4–7.7)
Neutrophils Relative %: 54.9 % (ref 43.0–77.0)
Platelets: 76 10*3/uL — ABNORMAL LOW (ref 150.0–400.0)
RBC: 3.1 Mil/uL — ABNORMAL LOW (ref 4.22–5.81)
RDW: 16.2 % — ABNORMAL HIGH (ref 11.5–15.5)
WBC: 3 10*3/uL — ABNORMAL LOW (ref 4.0–10.5)

## 2020-05-03 LAB — COMPREHENSIVE METABOLIC PANEL
ALT: 4 U/L (ref 0–53)
AST: 11 U/L (ref 0–37)
Albumin: 3.7 g/dL (ref 3.5–5.2)
Alkaline Phosphatase: 34 U/L — ABNORMAL LOW (ref 39–117)
BUN: 31 mg/dL — ABNORMAL HIGH (ref 6–23)
CO2: 24 mEq/L (ref 19–32)
Calcium: 9.2 mg/dL (ref 8.4–10.5)
Chloride: 108 mEq/L (ref 96–112)
Creatinine, Ser: 2.07 mg/dL — ABNORMAL HIGH (ref 0.40–1.50)
GFR: 36.68 mL/min — ABNORMAL LOW (ref >60.00)
Glucose, Bld: 83 mg/dL (ref 70–99)
Potassium: 3.8 mEq/L (ref 3.5–5.1)
Sodium: 139 mEq/L (ref 135–145)
Total Bilirubin: 0.6 mg/dL (ref 0.2–1.2)
Total Protein: 6.5 g/dL (ref 6.0–8.3)

## 2020-05-03 LAB — BRAIN NATRIURETIC PEPTIDE: Pro B Natriuretic peptide (BNP): 376 pg/mL — ABNORMAL HIGH (ref 0.0–100.0)

## 2020-05-03 NOTE — Progress Notes (Signed)
Devin Hogan is a 86 y.o. male with the following history as recorded in EpicCare:  Patient Active Problem List   Diagnosis Date Noted  . Pain and swelling of left knee 03/24/2020  . Vitamin D deficiency 12/23/2019  . Vitamin B12 deficiency 12/23/2019  . Pancytopenia ([place]) 12/16/2018  . Cough 12/11/2017  . Fall 12/11/2017  . MGUS (monoclonal gammopathy of unknown significance) 06/06/2017  . Dizziness 03/27/2017  . Pyuria 12/07/2016  . Preventative health care 12/09/2015  . UTI (lower urinary tract infection) 05/31/2012  . Nocturia 05/31/2012  . Thrombocytopenia ([place]) 12/05/2011  . Gout 05/17/2011  . CKD (chronic kidney disease) 11/18/2009  . COLONIC POLYPS, HX OF 11/19/2007  . Hyperlipidemia 05/22/2007  . ERECTILE DYSFUNCTION 05/22/2007  . Essential hypertension 05/22/2007  . ALLERGIC RHINITIS 05/22/2007  . BENIGN PROSTATIC HYPERTROPHY 05/22/2007    Current Outpatient Medications  Medication Sig Dispense Refill  . allopurinol (ZYLOPRIM) 100 MG tablet Take 0.5 tablets (50 mg total) by mouth daily. 90 tablet 1  . aspirin 81 MG EC tablet Take 81 mg by mouth daily.      [name] atenolol (TENORMIN) 25 MG tablet Take 1 tablet by mouth once daily 90 tablet 0  . colchicine 0.6 MG tablet Take 0.5 tablets (0.3 mg total) by mouth daily as needed (gout flair). 30 tablet 2  . Cyanocobalamin (B-12) 1000 MCG TABS Take 1 tablet by mouth daily. 90 tablet 3  . diltiazem (CARDIZEM CD) 240 MG 24 hr capsule Take 1 capsule (240 mg total) by mouth daily. Annual appt due in August must see provider for future refills 90 capsule 0  . HYDROcodone-acetaminophen (NORCO/VICODIN) 5-325 MG tablet Take 1 tablet by mouth every 6 (six) hours as needed for moderate pain. 15 tablet 0  . Hypromellose (ARTIFICIAL TEARS OP) Place 1 drop into both eyes daily as needed (dry eyes).    [name] lovastatin (MEVACOR) 20 MG tablet Take 1 tablet by mouth once daily 90 tablet 2  . Vitamin D, Ergocalciferol, (DRISDOL) 1.25 MG (50000 UT)  CAPS capsule Take 1 capsule (50,000 Units total) by mouth every 7 (seven) days. 12 capsule 0   No current facility-administered medications for this visit.    Allergies: Patient has no known allergies.  Past Medical History:  Diagnosis Date  . Acute gouty arthropathy 12/13/2010  . ALLERGIC RHINITIS 05/22/2007  . BENIGN PROSTATIC HYPERTROPHY 05/22/2007  . COLONIC POLYPS, HX OF 11/19/2007  . ERECTILE DYSFUNCTION 05/22/2007  . Gout 05/17/2011  . HYPERLIPIDEMIA 05/22/2007  . HYPERTENSION 05/22/2007  . RENAL INSUFFICIENCY 11/18/2009  . SKIN LESION 11/16/2010    History reviewed. No pertinent surgical history.  Family History  Problem Relation Age of Onset  . Lung cancer Brother   . Stroke Sister     Social History   Tobacco Use  . Smoking status: Former Research scientist (life sciences)  . Smokeless tobacco: Never Used  . Tobacco comment: quit when he was very yourg  Substance Use Topics  . Alcohol use: Yes    Alcohol/week: 17.0 standard drinks    Types: 4 Cans of beer, 1 Glasses of wine, 12 Shots of liquor per week    Comment: "couple of drinks per day" takes drink on the weekend    Subjective:  Accompanied by daughter; Patient presents with concerns for changes in bowel movements; in the past month, feels he has been having increased problems with constipation/ can have some diarrhea; has been taking Prune Lax, prune juice and stool softener;  Weight is noted to be  down 20 pounds since OV in May 2021; Per patient, he does feel like his appetite is normal; denies any nausea or vomiting or night sweats; Per daughter, there is swelling in his feet in the past 2 weeks which she has never noticed before;       Objective:  Vitals:   05/03/20 1224  BP: (!) 108/58  Pulse: (!) 54  Temp: 98.9 F (37.2 C)  TempSrc: Oral  SpO2: 97%  Weight: 148 lb (67.1 kg)  Height: 5' 8"  (1.727 m)    General: Well developed, well nourished, in no acute distress  Skin : Warm and dry.  Head: Normocephalic and atraumatic   Eyes: Sclera and conjunctiva clear; pupils round and reactive to light; extraocular movements intact  Ears: External normal; canals clear; tympanic membranes normal  Oropharynx: Pink, supple. No suspicious lesions  Neck: Supple without thyromegaly, adenopathy  Lungs: Respirations unlabored; clear to auscultation bilaterally without wheeze, rales, rhonchi  CVS exam: normal rate and regular rhythm.  Abdomen: Soft; nontender; nondistended; normoactive bowel sounds; no masses or hepatosplenomegaly- limited palpation as patient is in wheelchair Musculoskeletal: No deformities; no active joint inflammation  Extremities: bilateral pitting edema, cyanosis, clubbing  Vessels: Symmetric bilaterally  Neurologic: Alert and oriented; speech intact; face symmetrical; moves all extremities well; CNII-XII intact without focal deficit   Assessment:  1. Pedal edema   2. Anemia, unspecified type   3. Weight loss, abnormal   4. Change in bowel habits     Plan:  Numerous concerning changes today; will update labs today- patient was mildly anemic when labs were checked in May 2021; will most likely need CT without contrast due to renal functions; follow-up to be determined; Discussed trial of fluid pill but patient is concerned about needing to make it to the restroom quickly and notes that the swelling is not bothering him today;  This visit occurred during the SARS-CoV-2 public health emergency.  Safety protocols were in place, including screening questions prior to the visit, additional usage of staff PPE, and extensive cleaning of exam room while observing appropriate contact time as indicated for disinfecting solutions.     No follow-ups on file.  Orders Placed This Encounter  Procedures  . CBC with Differential/Platelet  . Iron, TIBC and Ferritin Panel  . B Nat Peptide  . Comp Met (CMET)    Requested Prescriptions    No prescriptions requested or ordered in this encounter

## 2020-05-04 LAB — IRON,TIBC AND FERRITIN PANEL
%SAT: 31 % (calc) (ref 20–48)
Ferritin: 101 ng/mL (ref 24–380)
Iron: 63 ug/dL (ref 50–180)
TIBC: 206 mcg/dL (calc) — ABNORMAL LOW (ref 250–425)

## 2020-05-05 ENCOUNTER — Telehealth: Payer: Self-pay | Admitting: Internal Medicine

## 2020-05-05 NOTE — Telephone Encounter (Signed)
New message:   Pt's wife is calling and states they would like a call about the lab results. Please advise.

## 2020-05-06 ENCOUNTER — Other Ambulatory Visit: Payer: Self-pay | Admitting: Family

## 2020-05-06 DIAGNOSIS — R634 Abnormal weight loss: Secondary | ICD-10-CM

## 2020-05-06 DIAGNOSIS — R194 Change in bowel habit: Secondary | ICD-10-CM

## 2020-05-06 NOTE — Progress Notes (Signed)
Reviewed results with patient's wife per patient's request.  1) Discussed elevated BNP and the swelling in his legs. He does not want to take a fluid pill at this time. He wants to discuss further with his PCP at his follow-up in August. 2) With weight loss/ changes in bowel habits, I want to get CT ( without contrast due to kidney functions) and refer to GI. Wife in agreement.

## 2020-05-06 NOTE — Telephone Encounter (Signed)
Pt spouse informed that we do not have interpretation results. Spouse stated understanding.

## 2020-05-06 NOTE — Telephone Encounter (Signed)
LVM for pt to call back as soon as possible.   

## 2020-05-13 ENCOUNTER — Other Ambulatory Visit: Payer: Self-pay | Admitting: Internal Medicine

## 2020-05-13 ENCOUNTER — Encounter: Payer: Self-pay | Admitting: Gastroenterology

## 2020-05-13 NOTE — Telephone Encounter (Signed)
Please refill as per office routine med refill policy (all routine meds refilled for 3 mo or monthly per pt preference up to one year from last visit, then month to month grace period for 3 mo, then further med refills will have to be denied)  

## 2020-05-23 DIAGNOSIS — H40033 Anatomical narrow angle, bilateral: Secondary | ICD-10-CM | POA: Diagnosis not present

## 2020-05-23 DIAGNOSIS — H43393 Other vitreous opacities, bilateral: Secondary | ICD-10-CM | POA: Diagnosis not present

## 2020-05-25 ENCOUNTER — Ambulatory Visit
Admission: RE | Admit: 2020-05-25 | Discharge: 2020-05-25 | Disposition: A | Discharge status: Home / Self Care | Payer: Medicare Other | Source: Ambulatory Visit | Attending: Family | Admitting: Family

## 2020-05-25 ENCOUNTER — Other Ambulatory Visit: Payer: Self-pay

## 2020-05-25 DIAGNOSIS — R634 Abnormal weight loss: Secondary | ICD-10-CM

## 2020-05-25 DIAGNOSIS — I700 Atherosclerosis of aorta: Secondary | ICD-10-CM | POA: Diagnosis not present

## 2020-05-25 DIAGNOSIS — J986 Disorders of diaphragm: Secondary | ICD-10-CM | POA: Diagnosis not present

## 2020-05-25 DIAGNOSIS — R194 Change in bowel habit: Secondary | ICD-10-CM

## 2020-06-22 ENCOUNTER — Other Ambulatory Visit: Payer: Self-pay

## 2020-06-22 ENCOUNTER — Ambulatory Visit (INDEPENDENT_AMBULATORY_CARE_PROVIDER_SITE_OTHER): Payer: Medicare Other | Admitting: Internal Medicine

## 2020-06-22 ENCOUNTER — Encounter: Payer: Self-pay | Admitting: Internal Medicine

## 2020-06-22 VITALS — BP 124/78 | HR 53 | Temp 98.5°F | Ht 68.0 in

## 2020-06-22 DIAGNOSIS — E559 Vitamin D deficiency, unspecified: Secondary | ICD-10-CM

## 2020-06-22 DIAGNOSIS — R269 Unspecified abnormalities of gait and mobility: Secondary | ICD-10-CM | POA: Diagnosis not present

## 2020-06-22 DIAGNOSIS — R7989 Other specified abnormal findings of blood chemistry: Secondary | ICD-10-CM | POA: Diagnosis not present

## 2020-06-22 DIAGNOSIS — Z0000 Encounter for general adult medical examination without abnormal findings: Secondary | ICD-10-CM | POA: Diagnosis not present

## 2020-06-22 DIAGNOSIS — Z0001 Encounter for general adult medical examination with abnormal findings: Secondary | ICD-10-CM

## 2020-06-22 DIAGNOSIS — E538 Deficiency of other specified B group vitamins: Secondary | ICD-10-CM

## 2020-06-22 DIAGNOSIS — N189 Chronic kidney disease, unspecified: Secondary | ICD-10-CM

## 2020-06-22 LAB — COMPLETE METABOLIC PANEL WITH GFR
AG Ratio: 1.5 (calc) (ref 1.0–2.5)
ALT: 5 U/L — ABNORMAL LOW (ref 9–46)
AST: 11 U/L (ref 10–35)
Albumin: 3.5 g/dL — ABNORMAL LOW (ref 3.6–5.1)
Alkaline phosphatase (APISO): 37 U/L (ref 35–144)
BUN/Creatinine Ratio: 13 (calc) (ref 6–22)
BUN: 21 mg/dL (ref 7–25)
CO2: 24 mmol/L (ref 20–32)
Calcium: 8.4 mg/dL — ABNORMAL LOW (ref 8.6–10.3)
Chloride: 109 mmol/L (ref 98–110)
Creat: 1.68 mg/dL — ABNORMAL HIGH (ref 0.70–1.11)
GFR, Est African American: 41 mL/min/{1.73_m2} — ABNORMAL LOW (ref >=60)
GFR, Est Non African American: 35 mL/min/{1.73_m2} — ABNORMAL LOW (ref >=60)
Globulin: 2.4 g/dL (calc) (ref 1.9–3.7)
Glucose, Bld: 76 mg/dL (ref 65–99)
Potassium: 3.8 mmol/L (ref 3.5–5.3)
Sodium: 140 mmol/L (ref 135–146)
Total Bilirubin: 0.5 mg/dL (ref 0.2–1.2)
Total Protein: 5.9 g/dL — ABNORMAL LOW (ref 6.1–8.1)

## 2020-06-22 NOTE — Progress Notes (Signed)
Subjective:    Patient ID: Devin Hogan, male    DOB: 01-Apr-1928, 86 y.o.   MRN: [id number]  HPI  Here for wellness and f/u;  Overall doing ok;  Pt denies Chest pain, worsening SOB, DOE, wheezing, orthopnea, PND, palpitations, dizziness or syncope, except has several weeks of worsening leg swelling and wt increase.   Pt denies neurological change such as new headache, facial or extremity weakness.  Pt denies polydipsia, polyuria, or low sugar symptoms. Pt states overall good compliance with treatment and medications, good tolerability, and has been trying to follow appropriate diet.  Pt denies worsening depressive symptoms, suicidal ideation or panic. No fever, night sweats, wt loss, loss of appetite, or other constitutional symptoms.  Pt states good ability with ADL's, has low fall risk, home safety reviewed and adequate, no other significant changes in hearing or vision, and not active with exercise, uses walker at home, and several falls recently.  Recent BNP mild elevated, pt has been declining diuretic.  Has recent worsening renal fxn as well Past Medical History:  Diagnosis Date  . Acute gouty arthropathy 12/13/2010  . ALLERGIC RHINITIS 05/22/2007  . BENIGN PROSTATIC HYPERTROPHY 05/22/2007  . COLONIC POLYPS, HX OF 11/19/2007  . ERECTILE DYSFUNCTION 05/22/2007  . Gout 05/17/2011  . HYPERLIPIDEMIA 05/22/2007  . HYPERTENSION 05/22/2007  . RENAL INSUFFICIENCY 11/18/2009  . SKIN LESION 11/16/2010   No past surgical history on file.  reports that he has quit smoking. He has never used smokeless tobacco. He reports current alcohol use of about 17.0 standard drinks of alcohol per week. He reports that he does not use drugs. family history includes Lung cancer in his brother; Stroke in his sister. No Known Allergies Current Outpatient Medications on File Prior to Visit  Medication Sig Dispense Refill  . allopurinol (ZYLOPRIM) 100 MG tablet Take 0.5 tablets (50 mg total) by mouth daily. 90 tablet 1  .  aspirin 81 MG EC tablet Take 81 mg by mouth daily.      [name] atenolol (TENORMIN) 25 MG tablet Take 1 tablet by mouth once daily 90 tablet 0  . colchicine 0.6 MG tablet Take 0.5 tablets (0.3 mg total) by mouth daily as needed (gout flair). 30 tablet 2  . Cyanocobalamin (B-12) 1000 MCG TABS Take 1 tablet by mouth daily. 90 tablet 3  . diltiazem (CARDIZEM CD) 240 MG 24 hr capsule Take 1 capsule (240 mg total) by mouth daily. Annual appt due in August must see provider for future refills 90 capsule 0  . HYDROcodone-acetaminophen (NORCO/VICODIN) 5-325 MG tablet Take 1 tablet by mouth every 6 (six) hours as needed for moderate pain. 15 tablet 0  . Hypromellose (ARTIFICIAL TEARS OP) Place 1 drop into both eyes daily as needed (dry eyes).    [name] lovastatin (MEVACOR) 20 MG tablet Take 1 tablet by mouth once daily 90 tablet 2  . Vitamin D, Ergocalciferol, (DRISDOL) 1.25 MG (50000 UT) CAPS capsule Take 1 capsule (50,000 Units total) by mouth every 7 (seven) days. 12 capsule 0  . [DISCONTINUED] diltiazem (DILACOR XR) 240 MG 24 hr capsule Take 1 capsule (240 mg total) by mouth daily. 30 capsule 11   No current facility-administered medications on file prior to visit.   Review of Systems All otherwise neg per pt    Objective:   Physical Exam BP 124/78   Pulse (!) 53   Temp 98.5 F (36.9 C) (Oral)   Ht 5\' 8"  (1.727 m)   SpO2 96%  BMI 22.50 kg/m  VS noted,  Constitutional: Pt appears in NAD HENT: Head: NCAT.  Right Ear: External ear normal.  Left Ear: External ear normal.  Eyes: . Pupils are equal, round, and reactive to light. Conjunctivae and EOM are normal Nose: without d/c or deformity Neck: Neck supple. Gross normal ROM Cardiovascular: Normal rate and regular rhythm.   Pulmonary/Chest: Effort normal and breath sounds without rales or wheezing.  Abd:  Soft, NT, ND, + BS, no organomegaly Neurological: Pt is alert. At baseline orientation, motor grossly intact Skin: Skin is warm. No rashes, other  new lesions, 2+ bilat LE edema to knees Psychiatric: Pt behavior is normal without agitation  All otherwise neg per pt Lab Results  Component Value Date   WBC 3.0 (L) 06/22/2020   HGB 9.2 (L) 06/22/2020   HCT 28.6 (L) 06/22/2020   PLT 104 (L) 06/22/2020   GLUCOSE 76 06/22/2020   CHOL 136 06/22/2020   TRIG 57 06/22/2020   HDL 55 06/22/2020   LDLCALC 68 06/22/2020   ALT 5 (L) 06/22/2020   AST 11 06/22/2020   NA 140 06/22/2020   K 3.8 06/22/2020   CL 109 06/22/2020   CREATININE 1.68 (H) 06/22/2020   BUN 21 06/22/2020   CO2 24 06/22/2020   TSH 3.55 06/22/2020   PSA 0.63 05/12/2010          Assessment & Plan:

## 2020-06-22 NOTE — Patient Instructions (Signed)
Please continue all other medications as before, and refills have been done if requested.  Please have the pharmacy call with any other refills you may need.  Please continue your efforts at being more active, low cholesterol diet, and weight control.  You are otherwise up to date with prevention measures today.  Please keep your appointments with your specialists as you may have planned  You will be contacted regarding the referral for: Nephrology (kidney doctor)  Please go to the LAB at the blood drawing area for the tests to be done  You will be contacted by phone if any changes need to be made immediately.  Otherwise, you will receive a letter about your results with an explanation, but please check with MyChart first.  Please remember to sign up for MyChart if you have not done so, as this will be important to you in the future with finding out test results, communicating by private email, and scheduling acute appointments online when needed.  Please make an Appointment to return in 6 months, or sooner if needed

## 2020-06-23 LAB — URINALYSIS, ROUTINE W REFLEX MICROSCOPIC
Bilirubin Urine: NEGATIVE
Glucose, UA: NEGATIVE
Hyaline Cast: NONE SEEN /LPF
Ketones, ur: NEGATIVE
Nitrite: POSITIVE — AB
Specific Gravity, Urine: 1.013 (ref 1.001–1.03)
Squamous Epithelial / HPF: NONE SEEN /HPF (ref <=5)
pH: <=5 (ref 5.0–8.0)

## 2020-06-23 LAB — CBC WITH DIFFERENTIAL/PLATELET
Absolute Monocytes: 456 cells/uL (ref 200–950)
Basophils Absolute: 21 cells/uL (ref 0–200)
Basophils Relative: 0.7 %
Eosinophils Absolute: 90 cells/uL (ref 15–500)
Eosinophils Relative: 3 %
HCT: 28.6 % — ABNORMAL LOW (ref 38.5–50.0)
Hemoglobin: 9.2 g/dL — ABNORMAL LOW (ref 13.2–17.1)
Lymphs Abs: 735 cells/uL — ABNORMAL LOW (ref 850–3900)
MCH: 31.5 pg (ref 27.0–33.0)
MCHC: 32.2 g/dL (ref 32.0–36.0)
MCV: 97.9 fL (ref 80.0–100.0)
MPV: 12.3 fL (ref 7.5–12.5)
Monocytes Relative: 15.2 %
Neutro Abs: 1698 cells/uL (ref 1500–7800)
Neutrophils Relative %: 56.6 %
Platelets: 104 10*3/uL — ABNORMAL LOW (ref 140–400)
RBC: 2.92 10*6/uL — ABNORMAL LOW (ref 4.20–5.80)
RDW: 14.2 % (ref 11.0–15.0)
Total Lymphocyte: 24.5 %
WBC: 3 10*3/uL — ABNORMAL LOW (ref 3.8–10.8)

## 2020-06-23 LAB — LIPID PANEL
Cholesterol: 136 mg/dL (ref <200)
HDL: 55 mg/dL (ref >=40)
LDL Cholesterol (Calc): 68 mg/dL (calc)
Non-HDL Cholesterol (Calc): 81 mg/dL (calc) (ref <130)
Total CHOL/HDL Ratio: 2.5 (calc) (ref <5.0)
Triglycerides: 57 mg/dL (ref <150)

## 2020-06-23 LAB — TSH: TSH: 3.55 mIU/L (ref 0.40–4.50)

## 2020-06-23 LAB — VITAMIN D 25 HYDROXY (VIT D DEFICIENCY, FRACTURES): Vit D, 25-Hydroxy: 25 ng/mL — ABNORMAL LOW (ref 30–100)

## 2020-06-23 LAB — VITAMIN B12: Vitamin B-12: 1261 pg/mL — ABNORMAL HIGH (ref 200–1100)

## 2020-06-23 LAB — BRAIN NATRIURETIC PEPTIDE: Brain Natriuretic Peptide: 732 pg/mL — ABNORMAL HIGH (ref <100)

## 2020-06-24 ENCOUNTER — Other Ambulatory Visit: Payer: Self-pay | Admitting: Internal Medicine

## 2020-06-24 ENCOUNTER — Encounter: Payer: Self-pay | Admitting: Internal Medicine

## 2020-06-24 DIAGNOSIS — I509 Heart failure, unspecified: Secondary | ICD-10-CM

## 2020-06-24 MED ORDER — FUROSEMIDE 40 MG PO TABS
40.0000 mg | ORAL_TABLET | Freq: Every day | ORAL | 3 refills | Status: DC
Start: 2020-06-24 — End: 2020-07-12

## 2020-06-25 ENCOUNTER — Encounter: Payer: Self-pay | Admitting: Internal Medicine

## 2020-06-25 NOTE — Assessment & Plan Note (Signed)
Worsening, for renal referral

## 2020-06-25 NOTE — Assessment & Plan Note (Signed)

## 2020-06-25 NOTE — Assessment & Plan Note (Signed)
Needs PT but declines outpt referral

## 2020-06-25 NOTE — Assessment & Plan Note (Addendum)
?   Chf, declines diuretic today for now, for repeat labs  I spent 31 minutes in preparing to see the patient by review of recent labs, imaging and procedures, obtaining and reviewing separately obtained history, communicating with the patient and family or caregiver, ordering medications, tests or procedures, and documenting clinical information in the EHR including the differential Dx, treatment, and any further evaluation and other management of elev bnp, vit d and b12 deficiency, gait disorder, ckd

## 2020-06-25 NOTE — Assessment & Plan Note (Signed)
Cont oral replacement 

## 2020-06-30 ENCOUNTER — Other Ambulatory Visit: Payer: Self-pay

## 2020-06-30 ENCOUNTER — Ambulatory Visit: Payer: Medicare Other | Admitting: Cardiovascular Disease

## 2020-06-30 ENCOUNTER — Encounter: Payer: Self-pay | Admitting: Cardiovascular Disease

## 2020-06-30 VITALS — BP 138/68 | HR 56 | Ht 69.0 in | Wt 167.0 lb

## 2020-06-30 DIAGNOSIS — R011 Cardiac murmur, unspecified: Secondary | ICD-10-CM | POA: Diagnosis not present

## 2020-06-30 DIAGNOSIS — R609 Edema, unspecified: Secondary | ICD-10-CM | POA: Diagnosis not present

## 2020-06-30 DIAGNOSIS — I10 Essential (primary) hypertension: Secondary | ICD-10-CM

## 2020-06-30 DIAGNOSIS — N1832 Chronic kidney disease, stage 3b: Secondary | ICD-10-CM | POA: Diagnosis not present

## 2020-06-30 DIAGNOSIS — I509 Heart failure, unspecified: Secondary | ICD-10-CM

## 2020-06-30 HISTORY — DX: Heart failure, unspecified: I50.9

## 2020-06-30 HISTORY — DX: Cardiac murmur, unspecified: R01.1

## 2020-06-30 NOTE — Progress Notes (Signed)
Cardiology Office Note   Date:  06/30/2020   ID:  Devin Hogan, Devin Hogan Sep 17, 1928, MRN [id number]  PCP:  Biagio Borg, MD  Cardiologist:   Skeet Latch, MD   No chief complaint on file.    History of Present Illness: Devin Hogan is a 86 y.o. male with hypertension, hyperlipidemia, and CKD III who is being seen today for the evaluation of edema at the request of Biagio Borg, MD.  Devin Hogan reports a couple weeks of increasing lower extremity edema.  BNP was elevated to 732.  It was recommended that he start taking furosemide.  However he has been hesitant to do so.  He saw Dr. Jenny Reichmann on 06/2020 and an echo was ordered but has not yet been performed.  He did send a prescription for furosemide but Devin Hogan has not yet started to take it.  Given his worsening kidney disease he was also referred to nephrology but has not seen them yet.  Overall he has felt well.  He has noted the edema but thinks it gets better by the morning.  He has no orthopnea or PND.  He denies any chest pain or pressure.  He does not get much exercise.  He uses a wheelchair when he has to walk long distances because his leg sometimes give out.  He lives with his daughter right now.  Typically he and his wife live at home but they have had some plumbing issues.  His wife has multiple sclerosis but is otherwise well.  He drinks about 3 waters daily.  He does not have many other beverages.  His daughter does try to limit the sodium intake in his food.  He does add salt to the food she cooks at times.   Past Medical History:  Diagnosis Date  . Acute gouty arthropathy 12/13/2010  . ALLERGIC RHINITIS 05/22/2007  . BENIGN PROSTATIC HYPERTROPHY 05/22/2007  . COLONIC POLYPS, HX OF 11/19/2007  . ERECTILE DYSFUNCTION 05/22/2007  . Gout 05/17/2011  . Heart failure, type unknown ([place]) 06/30/2020  . HYPERLIPIDEMIA 05/22/2007  . HYPERTENSION 05/22/2007  . RENAL INSUFFICIENCY 11/18/2009  . SKIN LESION 11/16/2010  . Systolic murmur  6/38/7564    No past surgical history on file.   Current Outpatient Medications  Medication Sig Dispense Refill  . allopurinol (ZYLOPRIM) 100 MG tablet Take 0.5 tablets (50 mg total) by mouth daily. 90 tablet 1  . aspirin 81 MG EC tablet Take 81 mg by mouth daily.      [name] atenolol (TENORMIN) 25 MG tablet Take 1 tablet by mouth once daily 90 tablet 0  . colchicine 0.6 MG tablet Take 0.5 tablets (0.3 mg total) by mouth daily as needed (gout flair). 30 tablet 2  . Cyanocobalamin (B-12) 1000 MCG TABS Take 1 tablet by mouth daily. 90 tablet 3  . diltiazem (CARDIZEM CD) 240 MG 24 hr capsule Take 1 capsule (240 mg total) by mouth daily. Annual appt due in August must see provider for future refills 90 capsule 0  . furosemide (LASIX) 40 MG tablet Take 1 tablet (40 mg total) by mouth daily. 90 tablet 3  . HYDROcodone-acetaminophen (NORCO/VICODIN) 5-325 MG tablet Take 1 tablet by mouth every 6 (six) hours as needed for moderate pain. 15 tablet 0  . Hypromellose (ARTIFICIAL TEARS OP) Place 1 drop into both eyes daily as needed (dry eyes).    [name] lovastatin (MEVACOR) 20 MG tablet Take 1 tablet by mouth once daily 90 tablet 2  .  Vitamin D, Ergocalciferol, (DRISDOL) 1.25 MG (50000 UT) CAPS capsule Take 1 capsule (50,000 Units total) by mouth every 7 (seven) days. 12 capsule 0   No current facility-administered medications for this visit.    Allergies:   Patient has no known allergies.    Social History:  The patient  reports that he has quit smoking. He has never used smokeless tobacco. He reports current alcohol use of about 17.0 standard drinks of alcohol per week. He reports that he does not use drugs.   Family History:  The patient's family history includes Lung cancer in his brother; Stroke in his sister.    ROS:  Please see the history of present illness.   Otherwise, review of systems are positive for none.   All other systems are reviewed and negative.    PHYSICAL EXAM: VS:  BP 138/68    Pulse (!) 56   Ht 5\' 9"  (1.753 m)   Wt 167 lb (75.8 kg)   SpO2 97%   BMI 24.66 kg/m  , BMI Body mass index is 24.66 kg/m. GENERAL:  Well appearing HEENT:  Pupils equal round and reactive, fundi not visualized, oral mucosa unremarkable NECK:  No jugular venous distention, waveform within normal limits, carotid upstroke brisk and symmetric, no bruits, no thyromegaly LYMPHATICS:  No cervical adenopathy LUNGS:  Clear to auscultation bilaterally HEART:  RRR.  PMI not displaced or sustained,S1 and S2 within normal limits, no S3, no S4, no clicks, no rubs, III/VI holosystolic murmur at the apex ABD:  Flat, positive bowel sounds normal in frequency in pitch, no bruits, no rebound, no guarding, no midline pulsatile mass, no hepatomegaly, no splenomegaly EXT:  2 plus pulses throughout, 2+ pitting edema to the upper tibia bilaterally, no cyanosis no clubbing SKIN:  No rashes no nodules NEURO:  Cranial nerves II through XII grossly intact, motor grossly intact throughout PSYCH:  Cognitively intact, oriented to person place and time   EKG:  EKG is ordered today. The ekg ordered today demonstrates sinus bradycardia.  Rate 56 bpm.  LAFB.  Right bundle branch block.   Recent Labs: 05/03/2020: Pro B Natriuretic peptide (BNP) 376.0 06/22/2020: ALT 5; Brain Natriuretic Peptide 732; BUN 21; Creat 1.68; Hemoglobin 9.2; Platelets 104; Potassium 3.8; Sodium 140; TSH 3.55    Lipid Panel    Component Value Date/Time   CHOL 136 06/22/2020 1136   TRIG 57 06/22/2020 1136   HDL 55 06/22/2020 1136   CHOLHDL 2.5 06/22/2020 1136   VLDL 13.0 06/17/2019 1015   LDLCALC 68 06/22/2020 1136      Wt Readings from Last 3 Encounters:  06/30/20 167 lb (75.8 kg)  05/03/20 148 lb (67.1 kg)  03/31/20 169 lb (76.7 kg)      ASSESSMENT AND PLAN:  # Heart failure, type unknown: # Essential hypertension:  # Bradycardia:  Devin Hogan is clearly volume overloaded.  He has significant edema.  I agree with Dr. Jenny Reichmann that  he should be on a diuretic.  He is amenable to filling his Lasix prescription.  He will also get an echocardiogram.  We will try to arrange for this to be done next week.  He can have a repeat basic metabolic panel checked at that time.  He does also have a systolic murmur in the apical position concerning for mitral regurgitation.  We will see what the echo shows.  If there is evidence of structural heart disease we may need to make changes to his medications.  Given his chronic  kidney disease and age, would like to get him off atenolol if possible.  He has been bradycardic, though this has been ongoing for many years and he seems to be asymptomatic.  Continue atenolol and diltiazem.  # Hyperlipidemia: Continue lovastatin. Lipids are well-controlled.  # CKD:  Creatinine has ranged from 1.6-2.6.  It seems to be improving lately.  I do think that starting Lasix is a good idea given his degree of volume overload.  We will recheck a basic metabolic panel next week as above.  Avoid nephrotoxins.  Current medicines are reviewed at length with the patient today.  The patient does not have concerns regarding medicines.  The following changes have been made:  no change  Labs/ tests ordered today include:   Orders Placed This Encounter  Procedures  . EKG 12-Lead  . ECHOCARDIOGRAM COMPLETE     Disposition:   FU with Elowyn Raupp C. Oval Linsey, MD, [facility] or APP in 1 month.     Signed, Amarri Satterly C. Oval Linsey, MD, [facility]  06/30/2020 1:37 PM    [facility]

## 2020-06-30 NOTE — Patient Instructions (Signed)
Medication Instructions:  PICK UP YOUR FUROSEMIDE AND START DAILY   *If you need a refill on your cardiac medications before your next appointment, please call your pharmacy*   Lab Work: BMET IN 1 [place] ECHO  If you have labs (blood work) drawn today and your tests are completely normal, you will receive your results only by: [name] MyChart Message (if you have MyChart) OR . A paper copy in the mail If you have any lab test that is abnormal or we need to change your treatment, we will call you to review the results.   Testing/Procedures: Your physician has requested that you have an echocardiogram. Echocardiography is a painless test that uses sound waves to create images of your heart. It provides your doctor with information about the size and shape of your heart and how well your heart's chambers and valves are working. This procedure takes approximately one hour. There are no restrictions for this procedure. END OF NEXT WEEK [facility] [address]   Follow-Up: At [facility], you and your health needs are our priority.  As part of our continuing mission to provide you with exceptional heart care, we have created designated Provider Care Teams.  These Care Teams include your primary Cardiologist (physician) and Advanced Practice Providers (APPs -  Physician Assistants and Nurse Practitioners) who all work together to provide you with the care you need, when you need it.  We recommend signing up for the patient portal called "MyChart".  Sign up information is provided on this After Visit Summary.  MyChart is used to connect with patients for Virtual Visits (Telemedicine).  Patients are able to view lab/test results, encounter notes, upcoming appointments, etc.  Non-urgent messages can be sent to your provider as well.   To learn more about what you can do with MyChart, go to [email or web address].    Your next appointment:   4 week(s)  The format  for your next appointment:   In Person  Provider:   You may see DR [facility] or one of the following Advanced Practice Providers on your designated Care Team:    Kerin Ransom, PA-C  [place], [place]  Coletta Memos, [place]

## 2020-07-08 ENCOUNTER — Other Ambulatory Visit: Payer: Self-pay | Admitting: *Deleted

## 2020-07-08 ENCOUNTER — Other Ambulatory Visit: Payer: Medicare Other | Admitting: *Deleted

## 2020-07-08 ENCOUNTER — Ambulatory Visit (HOSPITAL_COMMUNITY): Payer: Medicare Other | Attending: Cardiology

## 2020-07-08 ENCOUNTER — Other Ambulatory Visit: Payer: Self-pay

## 2020-07-08 DIAGNOSIS — N1832 Chronic kidney disease, stage 3b: Secondary | ICD-10-CM | POA: Diagnosis not present

## 2020-07-08 DIAGNOSIS — I10 Essential (primary) hypertension: Secondary | ICD-10-CM

## 2020-07-08 DIAGNOSIS — R011 Cardiac murmur, unspecified: Secondary | ICD-10-CM | POA: Diagnosis not present

## 2020-07-08 DIAGNOSIS — R609 Edema, unspecified: Secondary | ICD-10-CM | POA: Diagnosis not present

## 2020-07-08 LAB — ECHOCARDIOGRAM COMPLETE
Area-P 1/2: 1.94 cm2
S' Lateral: 2.6 cm

## 2020-07-08 NOTE — Progress Notes (Signed)
bmp 

## 2020-07-09 LAB — BASIC METABOLIC PANEL
BUN/Creatinine Ratio: 16 (ref 10–24)
BUN: 30 mg/dL (ref 10–36)
CO2: 24 mmol/L (ref 20–29)
Calcium: 8.4 mg/dL — ABNORMAL LOW (ref 8.6–10.2)
Chloride: 107 mmol/L — ABNORMAL HIGH (ref 96–106)
Creatinine, Ser: 1.86 mg/dL — ABNORMAL HIGH (ref 0.76–1.27)
GFR calc Af Amer: 36 mL/min/{1.73_m2} — ABNORMAL LOW (ref >59)
GFR calc non Af Amer: 31 mL/min/{1.73_m2} — ABNORMAL LOW (ref >59)
Glucose: 97 mg/dL (ref 65–99)
Potassium: 3.6 mmol/L (ref 3.5–5.2)
Sodium: 144 mmol/L (ref 134–144)

## 2020-07-09 LAB — SPECIMEN STATUS REPORT

## 2020-07-12 ENCOUNTER — Ambulatory Visit (INDEPENDENT_AMBULATORY_CARE_PROVIDER_SITE_OTHER): Payer: Medicare Other | Admitting: Internal Medicine

## 2020-07-12 ENCOUNTER — Encounter: Payer: Self-pay | Admitting: Internal Medicine

## 2020-07-12 ENCOUNTER — Other Ambulatory Visit: Payer: Self-pay

## 2020-07-12 VITALS — BP 130/70 | HR 85 | Temp 98.0°F | Ht 69.0 in

## 2020-07-12 DIAGNOSIS — I5033 Acute on chronic diastolic (congestive) heart failure: Secondary | ICD-10-CM

## 2020-07-12 DIAGNOSIS — I10 Essential (primary) hypertension: Secondary | ICD-10-CM | POA: Diagnosis not present

## 2020-07-12 DIAGNOSIS — I509 Heart failure, unspecified: Secondary | ICD-10-CM | POA: Diagnosis not present

## 2020-07-12 DIAGNOSIS — N1832 Chronic kidney disease, stage 3b: Secondary | ICD-10-CM

## 2020-07-12 MED ORDER — FUROSEMIDE 40 MG PO TABS: 40.0000 mg | ORAL_TABLET | Freq: Two times a day (BID) | ORAL | 3 refills | Status: DC

## 2020-07-12 NOTE — Progress Notes (Signed)
Subjective:    Patient ID: Devin Hogan, male    DOB: [birth year], 86 y.o.   MRN: [id number]  HPI  Here to f/u; overall doing ok,  Pt denies chest pain, increasing sob or doe, wheezing, orthopnea, PND, palpitations, dizziness or syncope, but has improved le swelling.  Pt denies new neurological symptoms such as new headache, or facial or extremity weakness or numbness.  Pt denies polydipsia, polyuria, or low sugar episode.  Pt states overall good compliance with meds, mostly trying to follow appropriate diet, with wt overall stable, Wt Readings from Last 3 Encounters:  06/30/20 167 lb (75.8 kg)  05/03/20 148 lb (67.1 kg)  03/31/20 169 lb (76.7 kg)   Past Medical History:  Diagnosis Date  . Acute gouty arthropathy 12/13/2010  . ALLERGIC RHINITIS 05/22/2007  . BENIGN PROSTATIC HYPERTROPHY 05/22/2007  . COLONIC POLYPS, HX OF 11/19/2007  . ERECTILE DYSFUNCTION 05/22/2007  . Gout 05/17/2011  . Heart failure, type unknown ([place]) 06/30/2020  . HYPERLIPIDEMIA 05/22/2007  . HYPERTENSION 05/22/2007  . RENAL INSUFFICIENCY 11/18/2009  . SKIN LESION 11/16/2010  . Systolic murmur 07/02/33   History reviewed. No pertinent surgical history.  reports that he has quit smoking. He has never used smokeless tobacco. He reports current alcohol use of about 17.0 standard drinks of alcohol per week. He reports that he does not use drugs. family history includes Lung cancer in his brother; Stroke in his sister. No Known Allergies Current Outpatient Medications on File Prior to Visit  Medication Sig Dispense Refill  . allopurinol (ZYLOPRIM) 100 MG tablet Take 0.5 tablets (50 mg total) by mouth daily. 90 tablet 1  . aspirin 81 MG EC tablet Take 81 mg by mouth daily.      [name] atenolol (TENORMIN) 25 MG tablet Take 1 tablet by mouth once daily 90 tablet 0  . colchicine 0.6 MG tablet Take 0.5 tablets (0.3 mg total) by mouth daily as needed (gout flair). 30 tablet 2  . Cyanocobalamin (B-12) 1000 MCG TABS Take 1 tablet by  mouth daily. 90 tablet 3  . diltiazem (CARDIZEM CD) 240 MG 24 hr capsule Take 1 capsule (240 mg total) by mouth daily. Annual appt due in August must see provider for future refills 90 capsule 0  . HYDROcodone-acetaminophen (NORCO/VICODIN) 5-325 MG tablet Take 1 tablet by mouth every 6 (six) hours as needed for moderate pain. 15 tablet 0  . Hypromellose (ARTIFICIAL TEARS OP) Place 1 drop into both eyes daily as needed (dry eyes).    [name] lovastatin (MEVACOR) 20 MG tablet Take 1 tablet by mouth once daily 90 tablet 2  . [DISCONTINUED] diltiazem (DILACOR XR) 240 MG 24 hr capsule Take 1 capsule (240 mg total) by mouth daily. 30 capsule 11   No current facility-administered medications on file prior to visit.   Review of Systems All otherwise neg per pt    Objective:   Physical Exam BP 130/70 (BP Location: Left Arm, Patient Position: Sitting, Cuff Size: Large)   Pulse 85   Temp 98 F (36.7 C) (Oral)   Ht 5\' 9"  (1.753 m)   SpO2 96%   BMI 24.66 kg/m  VS noted,  Constitutional: Pt appears in NAD HENT: Head: NCAT.  Right Ear: External ear normal.  Left Ear: External ear normal.  Eyes: . Pupils are equal, round, and reactive to light. Conjunctivae and EOM are normal Nose: without d/c or deformity Neck: Neck supple. Gross normal ROM Cardiovascular: Normal rate and regular rhythm.   Pulmonary/Chest:  Effort normal and breath sounds without rales or wheezing.  Neurological: Pt is alert. At baseline orientation, motor grossly intact Skin: Skin is warm. No rashes, other new lesions, trace bilat LE edema Psychiatric: Pt behavior is normal without agitation  All otherwise neg per pt Lab Results  Component Value Date   WBC 3.0 (L) 06/22/2020   HGB 9.2 (L) 06/22/2020   HCT 28.6 (L) 06/22/2020   PLT 104 (L) 06/22/2020   GLUCOSE 109 (H) 07/12/2020   CHOL 136 06/22/2020   TRIG 57 06/22/2020   HDL 55 06/22/2020   LDLCALC 68 06/22/2020   ALT 5 (L) 06/22/2020   AST 11 06/22/2020   NA 143  07/12/2020   K 3.8 07/12/2020   CL 109 07/12/2020   CREATININE 2.14 (H) 07/12/2020   BUN 26 (H) 07/12/2020   CO2 26 07/12/2020   TSH 3.55 06/22/2020   PSA 0.63 05/12/2010      Assessment & Plan:

## 2020-07-12 NOTE — Patient Instructions (Addendum)
Ok to increase the lasix (furosemide) fluid pill to 40 mg twice per day  Please make a goal to get off 10 lbs of fluid only for now; in other words you should check your weight every day in the morning, and when you get to 10 lbs less than today at home, you can cut back again to the fluid pills just once in the AM, at least until you see Cardiology  You will be contacted regarding the referral for: Home Health  Please continue all other medications as before, and refills have been done if requested.  Please have the pharmacy call with any other refills you may need.  Please continue your efforts at being more active, low cholesterol diet, and weight control.  Please keep your appointments with your specialists as you may have planned - sept 24 with cardiology  Please go to the LAB at the blood drawing area for the tests to be done  You will be contacted by phone if any changes need to be made immediately.  Otherwise, you will receive a letter about your results with an explanation, but please check with MyChart first.  Please remember to sign up for MyChart if you have not done so, as this will be important to you in the future with finding out test results, communicating by private email, and scheduling acute appointments online when needed.

## 2020-07-13 ENCOUNTER — Encounter: Payer: Self-pay | Admitting: Internal Medicine

## 2020-07-13 LAB — BASIC METABOLIC PANEL WITH GFR
BUN/Creatinine Ratio: 12 (calc) (ref 6–22)
BUN: 26 mg/dL — ABNORMAL HIGH (ref 7–25)
CO2: 26 mmol/L (ref 20–32)
Calcium: 9 mg/dL (ref 8.6–10.3)
Chloride: 109 mmol/L (ref 98–110)
Creat: 2.14 mg/dL — ABNORMAL HIGH (ref 0.70–1.11)
GFR, Est African American: 30 mL/min/{1.73_m2} — ABNORMAL LOW (ref >=60)
GFR, Est Non African American: 26 mL/min/{1.73_m2} — ABNORMAL LOW (ref >=60)
Glucose, Bld: 109 mg/dL — ABNORMAL HIGH (ref 65–99)
Potassium: 3.8 mmol/L (ref 3.5–5.3)
Sodium: 143 mmol/L (ref 135–146)

## 2020-07-17 ENCOUNTER — Encounter: Payer: Self-pay | Admitting: Internal Medicine

## 2020-07-17 NOTE — Assessment & Plan Note (Signed)
For f/u bmp, stable overall by history and exam, recent data reviewed with pt, and pt to continue medical treatment as before,  to f/u any worsening symptoms or concerns

## 2020-07-17 NOTE — Assessment & Plan Note (Signed)
stable overall by history and exam, recent data reviewed with pt, and pt to continue medical treatment as before,  to f/u any worsening symptoms or concerns  

## 2020-07-17 NOTE — Assessment & Plan Note (Addendum)
Suspect diastolic chf, improved, for increased lasix 40 bid, daily wts, check bmp today, and f/u cards sept 24 as planned, for home health referral  I spent 31 minutes in preparing to see the patient by review of recent labs, imaging and procedures, obtaining and reviewing separately obtained history, communicating with the patient and family or caregiver, ordering medications, tests or procedures, and documenting clinical information in the EHR including the differential Dx, treatment, and any further evaluation and other management of chf, ckd, htn

## 2020-07-19 ENCOUNTER — Telehealth: Payer: Self-pay | Admitting: Internal Medicine

## 2020-07-19 NOTE — Telephone Encounter (Signed)
diltiazem (CARDIZEM CD) 240 MG 24 hr capsule  [facility] [address], [place] - [address] Phone:  [phone]  Fax:  [phone]     Last appt: 9.7.21 Next appt: 12/27/20

## 2020-07-20 ENCOUNTER — Encounter: Payer: Self-pay | Admitting: Gastroenterology

## 2020-07-20 ENCOUNTER — Ambulatory Visit: Payer: Medicare Other | Admitting: Gastroenterology

## 2020-07-20 VITALS — BP 112/52 | HR 46 | Ht 69.0 in | Wt 154.0 lb

## 2020-07-20 DIAGNOSIS — Z86010 Personal history of colonic polyps: Secondary | ICD-10-CM | POA: Diagnosis not present

## 2020-07-20 DIAGNOSIS — K5904 Chronic idiopathic constipation: Secondary | ICD-10-CM

## 2020-07-20 NOTE — Progress Notes (Signed)
History of Present Illness: This is a 86 year old male referred by Biagio Borg, MD for the evaluation of constipation for the past year or two. He is accompanied by his daughter.  His last colonoscopy was in August 2006 showing only internal hemorrhoids. He has chronic anemia in setting of CKD with normal ferritin.  He has a bowel movement about every 2 to 3 days and most bowel movements require mild laxative.  Prunelax qd and Colace qd 2-3 times per week have been effective in treating his constipation for many months.  No change in his bowel pattern for many months.  His weight has fluctuated treating volume overload.  Denies abdominal pain, diarrhea, change in stool caliber, melena, hematochezia, nausea, vomiting, dysphagia, reflux symptoms, chest pain.    No Known Allergies Outpatient Medications Prior to Visit  Medication Sig Dispense Refill  . allopurinol (ZYLOPRIM) 100 MG tablet Take 0.5 tablets (50 mg total) by mouth daily. 90 tablet 1  . aspirin 81 MG EC tablet Take 81 mg by mouth daily.      [name] atenolol (TENORMIN) 25 MG tablet Take 1 tablet by mouth once daily 90 tablet 0  . colchicine 0.6 MG tablet Take 0.5 tablets (0.3 mg total) by mouth daily as needed (gout flair). 30 tablet 2  . Cyanocobalamin (B-12) 1000 MCG TABS Take 1 tablet by mouth daily. 90 tablet 3  . diltiazem (CARDIZEM CD) 240 MG 24 hr capsule Take 1 capsule (240 mg total) by mouth daily. Annual appt due in August must see provider for future refills 90 capsule 0  . furosemide (LASIX) 40 MG tablet Take 1 tablet (40 mg total) by mouth 2 (two) times daily. 180 tablet 3  . HYDROcodone-acetaminophen (NORCO/VICODIN) 5-325 MG tablet Take 1 tablet by mouth every 6 (six) hours as needed for moderate pain. 15 tablet 0  . Hypromellose (ARTIFICIAL TEARS OP) Place 1 drop into both eyes daily as needed (dry eyes).    [name] lovastatin (MEVACOR) 20 MG tablet Take 1 tablet by mouth once daily 90 tablet 2  . diltiazem (DILACOR XR) 240 MG  24 hr capsule Take 1 capsule (240 mg total) by mouth daily. 30 capsule 11   No facility-administered medications prior to visit.   Past Medical History:  Diagnosis Date  . Acute gouty arthropathy 12/13/2010  . ALLERGIC RHINITIS 05/22/2007  . BENIGN PROSTATIC HYPERTROPHY 05/22/2007  . COLONIC POLYPS, HX OF 11/19/2007  . ERECTILE DYSFUNCTION 05/22/2007  . Gout 05/17/2011  . Heart failure, type unknown ([place]) 06/30/2020  . HYPERLIPIDEMIA 05/22/2007  . HYPERTENSION 05/22/2007  . RENAL INSUFFICIENCY 11/18/2009  . SKIN LESION 11/16/2010  . Systolic murmur 4/69/6295   Past Surgical History:  Procedure Laterality Date  . none     Social History   Socioeconomic History  . Marital status: Married    Spouse name: Not on file  . Number of children: 5  . Years of education: Not on file  . Highest education level: Not on file  Occupational History  . Occupation: retired Research scientist (medical): RETIRED  Tobacco Use  . Smoking status: Former Research scientist (life sciences)  . Smokeless tobacco: Never Used  . Tobacco comment: quit when he was very yourg  Vaping Use  . Vaping Use: Never used  Substance and Sexual Activity  . Alcohol use: Not Currently    Alcohol/week: 17.0 standard drinks    Types: 1 Glasses of wine, 4 Cans of beer, 12 Shots of liquor per week  Comment: "couple of drinks per day" takes drink on the weekend; no longer drinks alcohol as of around 2019 (per pt 07/20/20)  . Drug use: No  . Sexual activity: Not on file  Other Topics Concern  . Not on file  Social History Narrative  . Not on file   Social Determinants of Health   Financial Resource Strain:   . Difficulty of Paying Living Expenses: Not on file  Food Insecurity:   . Worried About Charity fundraiser in the Last Year: Not on file  . Ran Out of Food in the Last Year: Not on file  Transportation Needs:   . Lack of Transportation (Medical): Not on file  . Lack of Transportation (Non-Medical): Not on file  Physical Activity:   . Days  of Exercise per Week: Not on file  . Minutes of Exercise per Session: Not on file  Stress:   . Feeling of Stress : Not on file  Social Connections:   . Frequency of Communication with Friends and Family: Not on file  . Frequency of Social Gatherings with Friends and Family: Not on file  . Attends Religious Services: Not on file  . Active Member of Clubs or Organizations: Not on file  . Attends Archivist Meetings: Not on file  . Marital Status: Not on file   Family History  Problem Relation Age of Onset  . Lung cancer Brother   . Stroke Sister   . Pancreatic cancer Neg Hx   . Stomach cancer Neg Hx   . Colon cancer Neg Hx   . Esophageal cancer Neg Hx       Review of Systems: Pertinent positive and negative review of systems were noted in the above HPI section. All other review of systems were otherwise negative.   Physical Exam: General: Well developed, well nourished, elderly, no acute distress, in a wheelchair Head: Normocephalic and atraumatic Eyes:  sclerae anicteric, EOMI Ears: Normal auditory acuity Mouth: Not examined, mask on during Covid-19 pandemic Neck: Supple, no masses or thyromegaly Lungs: Clear throughout to auscultation Heart: Regular rate and rhythm; no murmurs, rubs or bruits Abdomen: Soft, non tender and non distended. No masses, hepatosplenomegaly or hernias noted. Normal Bowel sounds Rectal: No lesions, no tenderness, brown heme negative stool Musculoskeletal: Symmetrical with no gross deformities  Skin: No lesions on visible extremities Pulses:  Normal pulses noted Extremities: No clubbing, cyanosis, edema or deformities noted Neurological: Alert oriented x 4, grossly nonfocal Cervical Nodes:  No significant cervical adenopathy Inguinal Nodes: No significant inguinal adenopathy Psychological:  Alert and cooperative. Normal mood and affect   Assessment and Recommendations:  1.  Chronic idiopathic constipation.  Adequate daily fiber and  water intake.  Continue Prunelax qd prn, Colace qd prn.  No further evaluation at this time.  Follow-up with PCP.  2. Personal history of adenomatous colon polyps.  He is no longer in a surveillance program due to age.   cc: Biagio Borg, MD [address] [place],  [place] [address]

## 2020-07-20 NOTE — Patient Instructions (Signed)
Continue current laxatives.   Follow up as needed.   Thank you for choosing me and [facility].  Pricilla Riffle. Dagoberto Ligas., MD., [facility]

## 2020-07-21 ENCOUNTER — Other Ambulatory Visit: Payer: Self-pay

## 2020-07-21 MED ORDER — DILTIAZEM HCL ER COATED BEADS 240 MG PO CP24: 240.0000 mg | ORAL_CAPSULE | Freq: Every day | ORAL | 0 refills | Status: DC

## 2020-07-25 DIAGNOSIS — N400 Enlarged prostate without lower urinary tract symptoms: Secondary | ICD-10-CM | POA: Diagnosis not present

## 2020-07-25 DIAGNOSIS — Z79891 Long term (current) use of opiate analgesic: Secondary | ICD-10-CM | POA: Diagnosis not present

## 2020-07-25 DIAGNOSIS — I130 Hypertensive heart and chronic kidney disease with heart failure and stage 1 through stage 4 chronic kidney disease, or unspecified chronic kidney disease: Secondary | ICD-10-CM | POA: Diagnosis not present

## 2020-07-25 DIAGNOSIS — Z87891 Personal history of nicotine dependence: Secondary | ICD-10-CM | POA: Diagnosis not present

## 2020-07-25 DIAGNOSIS — M109 Gout, unspecified: Secondary | ICD-10-CM | POA: Diagnosis not present

## 2020-07-25 DIAGNOSIS — N529 Male erectile dysfunction, unspecified: Secondary | ICD-10-CM | POA: Diagnosis not present

## 2020-07-25 DIAGNOSIS — E785 Hyperlipidemia, unspecified: Secondary | ICD-10-CM | POA: Diagnosis not present

## 2020-07-25 DIAGNOSIS — I5033 Acute on chronic diastolic (congestive) heart failure: Secondary | ICD-10-CM | POA: Diagnosis not present

## 2020-07-25 DIAGNOSIS — N189 Chronic kidney disease, unspecified: Secondary | ICD-10-CM | POA: Diagnosis not present

## 2020-07-25 DIAGNOSIS — J309 Allergic rhinitis, unspecified: Secondary | ICD-10-CM | POA: Diagnosis not present

## 2020-07-25 DIAGNOSIS — Z7982 Long term (current) use of aspirin: Secondary | ICD-10-CM | POA: Diagnosis not present

## 2020-07-26 ENCOUNTER — Telehealth: Payer: Self-pay | Admitting: Cardiovascular Disease

## 2020-07-26 ENCOUNTER — Telehealth: Payer: Self-pay | Admitting: Internal Medicine

## 2020-07-26 NOTE — Telephone Encounter (Signed)
Sent to Dr. John. 

## 2020-07-26 NOTE — Telephone Encounter (Signed)
    STAT if HR is under 50 or over 120 (normal HR is 60-100 beats per minute)  1) What is your heart rate? 57  2) Do you have a log of your heart rate readings (document readings)? 44 at rest/57 with activity   3) Do you have any other symptoms? no  Home Health PT Called. The PT was at the patient's house today for an evaluation. The Home Health PT wanted to let our office know about the patient's low HR because the patient will be seen on Friday. The patient was not having any symptoms.  Please contact the wife if the office needs any further information is needed. The wife was present during the [place] PT Evaluation

## 2020-07-26 NOTE — Telephone Encounter (Signed)
Please call to let pt know to stop the tenormin (atenolol) 25 mg, as even the low dose is causing too low HR apparently when taking with the cardizem, make sure to keep appt with cardiology

## 2020-07-26 NOTE — Telephone Encounter (Signed)
   Jerl Mina from [facility] [phone] to report patient no longer needs home PT  She is also reporting HR Resting 44 After walking 57 BP 90/60 Patient has no symptoms, denied any pain Appointment with Cardiology 9/24

## 2020-07-26 NOTE — Telephone Encounter (Signed)
Attempted to patient/wife to follow up on message from [facility] PT Phone rang with no answer

## 2020-07-27 NOTE — Telephone Encounter (Signed)
Left message to call back  

## 2020-07-27 NOTE — Telephone Encounter (Signed)
Was able to speak with pts wife and inform her of Dr. Gwynn Burly instructions. Pt has an apptmnt on Friday with Cardiology.

## 2020-07-28 NOTE — Telephone Encounter (Signed)
Daughter, Thayer Headings, is returning call

## 2020-07-28 NOTE — Telephone Encounter (Signed)
Called patient's daughter back and informed her of patient's HR being 91 unless he is having symptoms. Patient's daughter stated he was not having symptoms. She stated he had an appointment tomorrow. Encouraged her to have patient keep his appointment tomorrow.

## 2020-07-28 NOTE — Telephone Encounter (Signed)
OK with HR 57 unless he is having symptoms.

## 2020-07-29 ENCOUNTER — Encounter: Payer: Self-pay | Admitting: Cardiology

## 2020-07-29 ENCOUNTER — Ambulatory Visit: Payer: Medicare Other | Admitting: Cardiology

## 2020-07-29 ENCOUNTER — Other Ambulatory Visit: Payer: Self-pay

## 2020-07-29 VITALS — BP 134/62 | HR 53 | Temp 97.0°F | Resp 20 | Ht 69.0 in | Wt 156.0 lb

## 2020-07-29 DIAGNOSIS — N184 Chronic kidney disease, stage 4 (severe): Secondary | ICD-10-CM

## 2020-07-29 DIAGNOSIS — N1832 Chronic kidney disease, stage 3b: Secondary | ICD-10-CM | POA: Diagnosis not present

## 2020-07-29 DIAGNOSIS — I5033 Acute on chronic diastolic (congestive) heart failure: Secondary | ICD-10-CM | POA: Diagnosis not present

## 2020-07-29 DIAGNOSIS — I509 Heart failure, unspecified: Secondary | ICD-10-CM | POA: Diagnosis not present

## 2020-07-29 DIAGNOSIS — I10 Essential (primary) hypertension: Secondary | ICD-10-CM

## 2020-07-29 DIAGNOSIS — I5032 Chronic diastolic (congestive) heart failure: Secondary | ICD-10-CM | POA: Insufficient documentation

## 2020-07-29 NOTE — Assessment & Plan Note (Addendum)
GFR 30.  The patient is followed at [facility] and I asked the family to reach out to them for a f/u appointment.  Check BMP today and consider adjusted Lasix based on these results.

## 2020-07-29 NOTE — Assessment & Plan Note (Signed)
Controlled- echo 07/08/2020- normal LVF, grade 2 DD

## 2020-07-29 NOTE — Patient Instructions (Signed)
Medication Instructions:  Your physician recommends that you continue on your current medications as directed. Please refer to the Current Medication list given to you today.  *If you need a refill on your cardiac medications before your next appointment, please call your pharmacy*   Lab Work: Your physician recommends that you return for lab work today: BMET  If you have labs (blood work) drawn today and your tests are completely normal, you will receive your results only by: [name] MyChart Message (if you have MyChart) OR . A paper copy in the mail If you have any lab test that is abnormal or we need to change your treatment, we will call you to review the results.   Follow-Up: At [facility], you and your health needs are our priority.  As part of our continuing mission to provide you with exceptional heart care, we have created designated Provider Care Teams.  These Care Teams include your primary Cardiologist (physician) and Advanced Practice Providers (APPs -  Physician Assistants and Nurse Practitioners) who all work together to provide you with the care you need, when you need it.  We recommend signing up for the patient portal called "MyChart".  Sign up information is provided on this After Visit Summary.  MyChart is used to connect with patients for Virtual Visits (Telemedicine).  Patients are able to view lab/test results, encounter notes, upcoming appointments, etc.  Non-urgent messages can be sent to your provider as well.   To learn more about what you can do with MyChart, go to [email or web address].    Your next appointment:   3 month(s)  The format for your next appointment:   In Person  Provider:   Skeet Latch, MD   Other Instructions Please review the [place] [place] sheet given to you today.  Please call [facility] for a follow-up appointment.

## 2020-07-29 NOTE — Progress Notes (Signed)
Cardiology Office Note:    Date:  07/29/2020   ID:  Devin Hogan, DOB [birth year], MRN [id number]  PCP:  Devin Borg, MD  Cardiologist:  No primary care provider on file.  Electrophysiologist:  None   Referring MD: Devin Borg, MD   Chief Complaint  Patient presents with  . Follow-up    4wk follow up     History of Present Illness:    Devin Hogan is a 86 y.o. male with a hx of hypertension with hypertensive cardiovascular disease, diastolic dysfunction, chronic renal insufficiency stage 4, chronic bradycardia, and hypertension.  He saw Dr. Oval Hogan 06/30/2020 in the office.  He had been taken off his diuretics because of renal insufficiency.  The patient had increasing lower extremity edema and Dr. Oval Hogan had him resume his furosemide 40 mg twice daily. BMP done 07/12/2020 showed a SCr of 2.14 (GFR 30).    He presents to the office today for follow-up.  Is accompanied by his family.  The patient and the family both say that his lower extremity edema has improved.  He denies any shortness of breath.  Overall he is pretty frail, he is in a wheelchair.  Also since we last saw him his Tenormin 25 mg daily has been stopped because of bradycardia.  The patient has not had syncope or presyncope, he did have documented bradycardia noted by a physical therapist.  Past Medical History:  Diagnosis Date  . Acute gouty arthropathy 12/13/2010  . ALLERGIC RHINITIS 05/22/2007  . BENIGN PROSTATIC HYPERTROPHY 05/22/2007  . COLONIC POLYPS, HX OF 11/19/2007  . ERECTILE DYSFUNCTION 05/22/2007  . Gout 05/17/2011  . Heart failure, type unknown ([place]) 06/30/2020  . HYPERLIPIDEMIA 05/22/2007  . HYPERTENSION 05/22/2007  . RENAL INSUFFICIENCY 11/18/2009  . SKIN LESION 11/16/2010  . Systolic murmur 12/19/863    Past Surgical History:  Procedure Laterality Date  . none      Current Medications: Current Meds  Medication Sig  . allopurinol (ZYLOPRIM) 100 MG tablet Take 0.5 tablets (50 mg total) by mouth  daily.  . AMBULATORY NON FORMULARY MEDICATION Medication Name: prune lax 2-3 x week  . aspirin 81 MG EC tablet Take 81 mg by mouth daily.    . colchicine 0.6 MG tablet Take 0.5 tablets (0.3 mg total) by mouth daily as needed (gout flair).  . Cyanocobalamin (B-12) 1000 MCG TABS Take 1 tablet by mouth daily.  [name] diltiazem (CARDIZEM CD) 240 MG 24 hr capsule Take 1 capsule (240 mg total) by mouth daily. Annual appt due in August must see provider for future refills  . docusate sodium (COLACE) 100 MG capsule Take 100 mg by mouth 2 (two) times a week.  . furosemide (LASIX) 40 MG tablet Take 1 tablet (40 mg total) by mouth 2 (two) times daily. (Patient taking differently: Take 40 mg by mouth 2 (two) times daily. Take 2 tablets twice a day)  . HYDROcodone-acetaminophen (NORCO/VICODIN) 5-325 MG tablet Take 1 tablet by mouth every 6 (six) hours as needed for moderate pain.  . Hypromellose (ARTIFICIAL TEARS OP) Place 1 drop into both eyes daily as needed (dry eyes).  [name] lovastatin (MEVACOR) 20 MG tablet Take 1 tablet by mouth once daily     Allergies:   Patient has no known allergies.   Social History   Socioeconomic History  . Marital status: Married    Spouse name: Not on file  . Number of children: 5  . Years of education: Not on file  .  Highest education level: Not on file  Occupational History  . Occupation: retired Research scientist (medical): RETIRED  Tobacco Use  . Smoking status: Former Research scientist (life sciences)  . Smokeless tobacco: Never Used  . Tobacco comment: quit when he was very yourg  Vaping Use  . Vaping Use: Never used  Substance and Sexual Activity  . Alcohol use: Not Currently    Alcohol/week: 17.0 standard drinks    Types: 1 Glasses of wine, 4 Cans of beer, 12 Shots of liquor per week    Comment: "couple of drinks per day" takes drink on the weekend; no longer drinks alcohol as of around 2019 (per pt 07/20/20)  . Drug use: No  . Sexual activity: Not on file  Other Topics Concern  . Not on  file  Social History Narrative  . Not on file   Social Determinants of Health   Financial Resource Strain:   . Difficulty of Paying Living Expenses: Not on file  Food Insecurity:   . Worried About Charity fundraiser in the Last Year: Not on file  . Ran Out of Food in the Last Year: Not on file  Transportation Needs:   . Lack of Transportation (Medical): Not on file  . Lack of Transportation (Non-Medical): Not on file  Physical Activity:   . Days of Exercise per Week: Not on file  . Minutes of Exercise per Session: Not on file  Stress:   . Feeling of Stress : Not on file  Social Connections:   . Frequency of Communication with Friends and Family: Not on file  . Frequency of Social Gatherings with Friends and Family: Not on file  . Attends Religious Services: Not on file  . Active Member of Clubs or Organizations: Not on file  . Attends Archivist Meetings: Not on file  . Marital Status: Not on file     Family History: The patient's family history includes Lung cancer in his brother; Stroke in his sister. There is no history of Pancreatic cancer, Stomach cancer, Colon cancer, or Esophageal cancer.  ROS:   Please see the history of present illness.     All other systems reviewed and are negative.  EKGs/Labs/Other Studies Reviewed:    The following studies were reviewed today: Echo 9/03/20201- IMPRESSIONS    1. Left ventricular ejection fraction, by estimation, is 55 to 60%. The  left ventricle has normal function. The left ventricle has no regional  wall motion abnormalities. There is mild left ventricular hypertrophy.  Left ventricular diastolic parameters  are consistent with Grade II diastolic dysfunction (pseudonormalization).  Elevated left atrial pressure.  2. Right ventricular systolic function is normal. The right ventricular  size is normal.  3. Left atrial size was severely dilated.  4. The mitral valve is normal in structure. Mild mitral valve   regurgitation. No evidence of mitral stenosis.  5. The aortic valve is tricuspid. Aortic valve regurgitation is mild.  Mild to moderate aortic valve sclerosis/calcification is present, without  any evidence of aortic stenosis.  6. Aortic dilatation noted. There is mild dilatation of the ascending  aorta measuring 39 mm.   EKG:  EKG is not ordered today.  The ekg ordered 06/30/2020 demonstrates NSR, SB-HR 56, RBBB  Recent Labs: 05/03/2020: Pro B Natriuretic peptide (BNP) 376.0 06/22/2020: ALT 5; Brain Natriuretic Peptide 732; Hemoglobin 9.2; Platelets 104; TSH 3.55 07/12/2020: BUN 26; Creat 2.14; Potassium 3.8; Sodium 143  Recent Lipid Panel    Component Value Date/Time  CHOL 136 06/22/2020 1136   TRIG 57 06/22/2020 1136   HDL 55 06/22/2020 1136   CHOLHDL 2.5 06/22/2020 1136   VLDL 13.0 06/17/2019 1015   LDLCALC 68 06/22/2020 1136    Physical Exam:    VS:  BP 134/62 (BP Location: Left Arm, Patient Position: Sitting, Cuff Size: Normal)   Pulse (!) 53   Temp (!) 97 F (36.1 C) (Tympanic)   Resp 20   Ht 5\' 9"  (1.753 m)   Wt 156 lb (70.8 kg)   SpO2 98%   BMI 23.04 kg/m     Wt Readings from Last 3 Encounters:  07/29/20 156 lb (70.8 kg)  07/20/20 154 lb (69.9 kg)  06/30/20 167 lb (75.8 kg)     GEN: Frail AA male in wheel chair, well developed in no acute distress HEENT: Normal NECK: No JVD; No carotid bruits CARDIAC: RRR, no murmurs, rubs, gallops RESPIRATORY:  Clear to auscultation without rales, wheezing or rhonchi  ABDOMEN: Soft, non-tender, non-distended MUSCULOSKELETAL:  trace edema; No deformity  SKIN: Warm and dry NEUROLOGIC:  Alert and oriented x 3 PSYCHIATRIC:  Normal affect   ASSESSMENT:    Acute on chronic diastolic heart failure (HCC) Improved with the resumption of Lasix 06/30/2020  Essential hypertension Controlled- echo 07/08/2020- normal LVF, grade 2 DD  CRI (chronic renal insufficiency), stage 4 (severe) (HCC) GFR 30.  The patient is followed at  [facility] and I asked the family to reach out to them for a f/u appointment.  Check BMP today and consider adjusted Lasix based on these results.  PLAN:    Check BMP-consider adjusting Lasix if needed. I encouraged f/u with [facility] as well.    Medication Adjustments/Labs and Tests Ordered: Current medicines are reviewed at length with the patient today.  Concerns regarding medicines are outlined above.  Orders Placed This Encounter  Procedures  . Basic metabolic panel   No orders of the defined types were placed in this encounter.   Patient Instructions  Medication Instructions:  Your physician recommends that you continue on your current medications as directed. Please refer to the Current Medication list given to you today.  *If you need a refill on your cardiac medications before your next appointment, please call your pharmacy*   Lab Work: Your physician recommends that you return for lab work today: BMET  If you have labs (blood work) drawn today and your tests are completely normal, you will receive your results only by: [name] MyChart Message (if you have MyChart) OR . A paper copy in the mail If you have any lab test that is abnormal or we need to change your treatment, we will call you to review the results.   Follow-Up: At [facility], you and your health needs are our priority.  As part of our continuing mission to provide you with exceptional heart care, we have created designated Provider Care Teams.  These Care Teams include your primary Cardiologist (physician) and Advanced Practice Providers (APPs -  Physician Assistants and Nurse Practitioners) who all work together to provide you with the care you need, when you need it.  We recommend signing up for the patient portal called "MyChart".  Sign up information is provided on this After Visit Summary.  MyChart is used to connect with patients for Virtual Visits (Telemedicine).  Patients are able  to view lab/test results, encounter notes, upcoming appointments, etc.  Non-urgent messages can be sent to your provider as well.   To learn  more about what you can do with MyChart, go to [email or web address].    Your next appointment:   3 month(s)  The format for your next appointment:   In Person  Provider:   Skeet Latch, MD   Other Instructions Please review the [facility] sheet given to you today.  Please call [facility] for a follow-up appointment.     Angelena Form, PA-C  07/29/2020 10:42 AM    [facility]

## 2020-07-29 NOTE — Assessment & Plan Note (Signed)
Improved with the resumption of Lasix 06/30/2020

## 2020-07-30 LAB — BASIC METABOLIC PANEL
BUN/Creatinine Ratio: 20 (ref 10–24)
BUN: 46 mg/dL — ABNORMAL HIGH (ref 10–36)
CO2: 23 mmol/L (ref 20–29)
Calcium: 9.3 mg/dL (ref 8.6–10.2)
Chloride: 107 mmol/L — ABNORMAL HIGH (ref 96–106)
Creatinine, Ser: 2.26 mg/dL — ABNORMAL HIGH (ref 0.76–1.27)
GFR calc Af Amer: 28 mL/min/{1.73_m2} — ABNORMAL LOW (ref >59)
GFR calc non Af Amer: 25 mL/min/{1.73_m2} — ABNORMAL LOW (ref >59)
Glucose: 86 mg/dL (ref 65–99)
Potassium: 3.7 mmol/L (ref 3.5–5.2)
Sodium: 147 mmol/L — ABNORMAL HIGH (ref 134–144)

## 2020-08-03 ENCOUNTER — Telehealth: Payer: Self-pay | Admitting: Cardiovascular Disease

## 2020-08-03 ENCOUNTER — Telehealth: Payer: Self-pay | Admitting: *Deleted

## 2020-08-03 NOTE — Telephone Encounter (Signed)
Spoke with pt daughter(DPR) about pt blood work and medication changes, to decrease lasix to 40 mg daily.

## 2020-08-03 NOTE — Telephone Encounter (Signed)
Patient's daughter states they were told the patient needs to see his kidney doctor at [place], but they said since he was last seen in 2019 they will need labs faxed to their office.

## 2020-08-03 NOTE — Telephone Encounter (Signed)
Pt's daughter aware last lab values faxed to Dr Hollie Salk at [facility]

## 2020-08-03 NOTE — Telephone Encounter (Signed)
-----   Message from Erlene Quan, [place] sent at 08/01/2020  5:16 PM EDT ----- Please let the patient know his labs looked OK- he has chronic renal insufficiency.  I think he should decrease the Lasix to 40 mg daily and f/u with [facility] as we discussed.  Kerin Ransom PA-C 08/01/2020 5:16 PM

## 2020-08-16 IMAGING — CT CT ABD-PELV W/O CM
1 of 2 series · 14 of 32 positions shown, 19 images · non-contrast
Comparison: None.

CLINICAL DATA: Unintended weight loss

EXAM:
CT ABDOMEN AND PELVIS WITHOUT CONTRAST
TECHNIQUE: Multidetector CT imaging of the abdomen and pelvis was performed
following the standard protocol without IV contrast. Oral enteric
contrast was administered.

[Series 3: abd/pelvis w/(date) · axial · 0.76mm/px · z∈[-582,-152]mm · 14 of 98 slices shown, 19 images]
[im 6/98  soft-tissue]
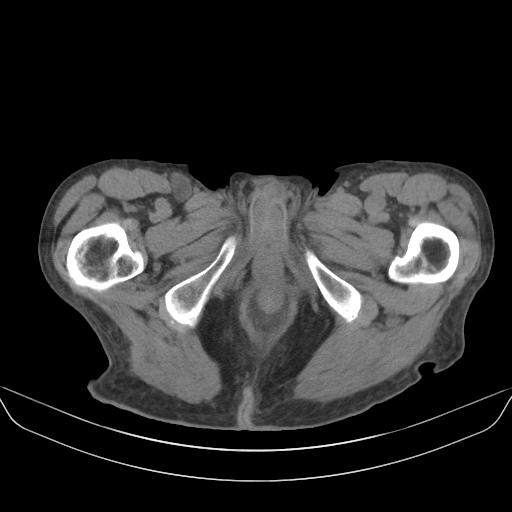
[im 6/98  bone]
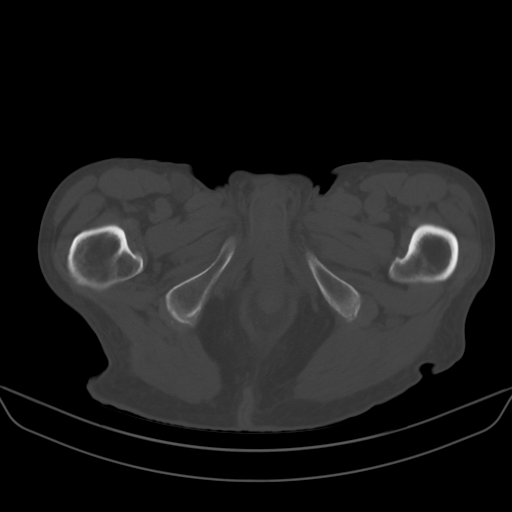
[im 12/98  soft-tissue]
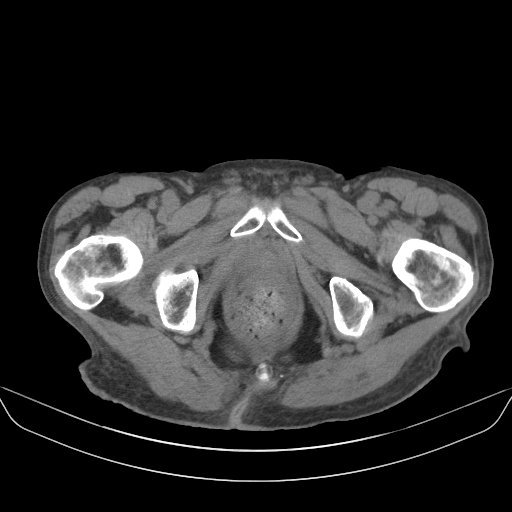
[im 23/98  soft-tissue]
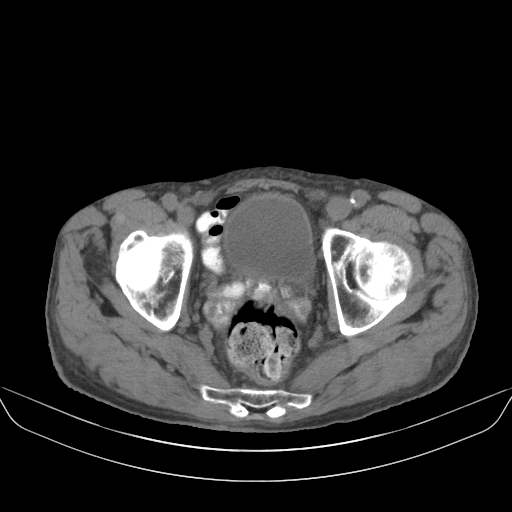
[im 29/98  soft-tissue]
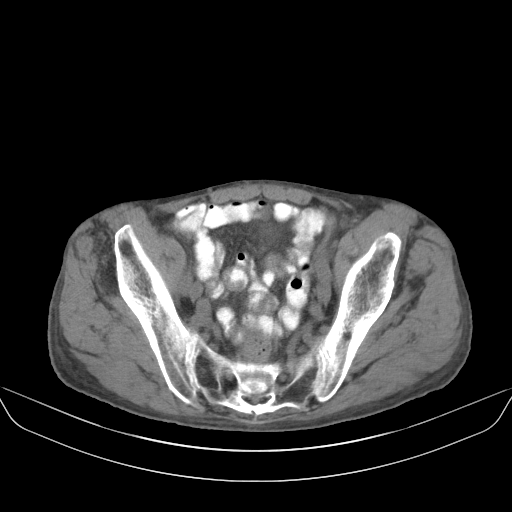
[im 35/98  soft-tissue]
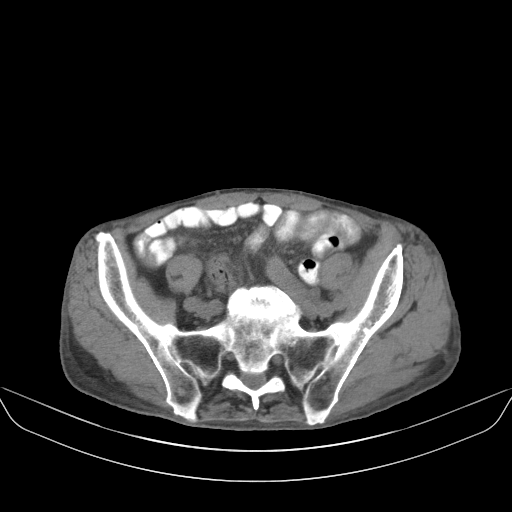
[im 40/98  soft-tissue]
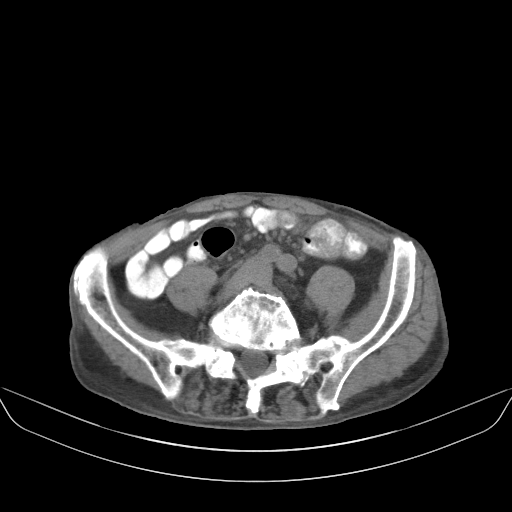
[im 52/98  soft-tissue]
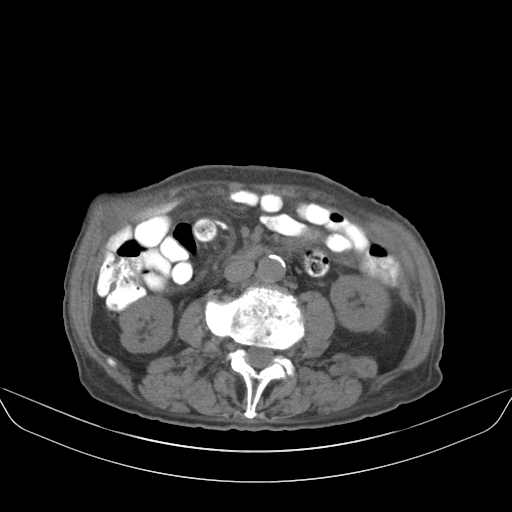
[im 58/98  soft-tissue]
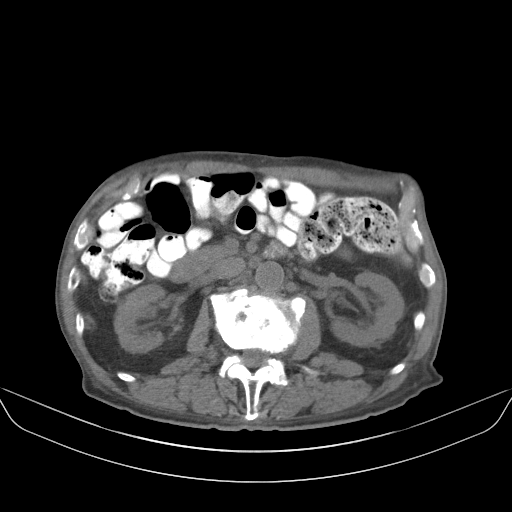
[im 63/98  soft-tissue]
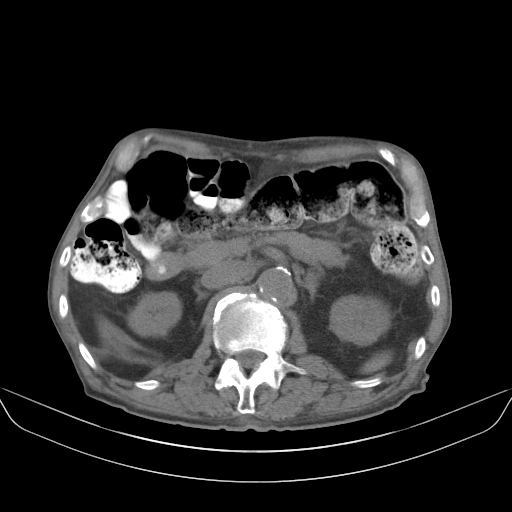
[im 63/98  bone]
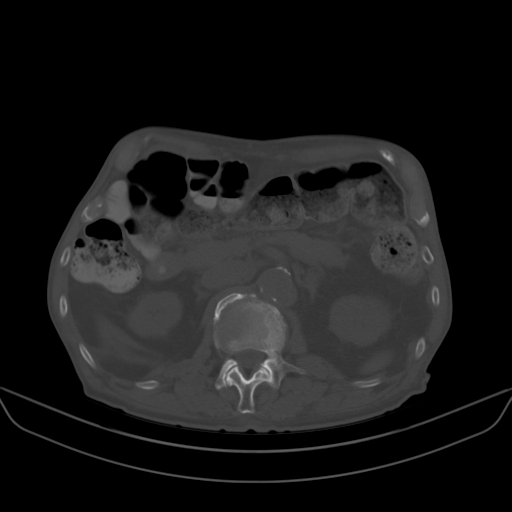
[im 69/98  soft-tissue]
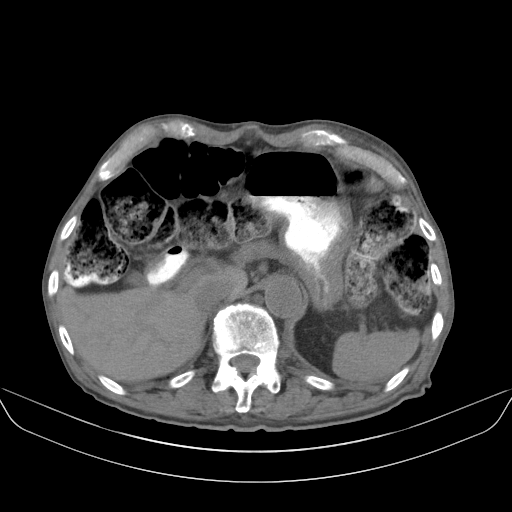
[im 75/98  soft-tissue]
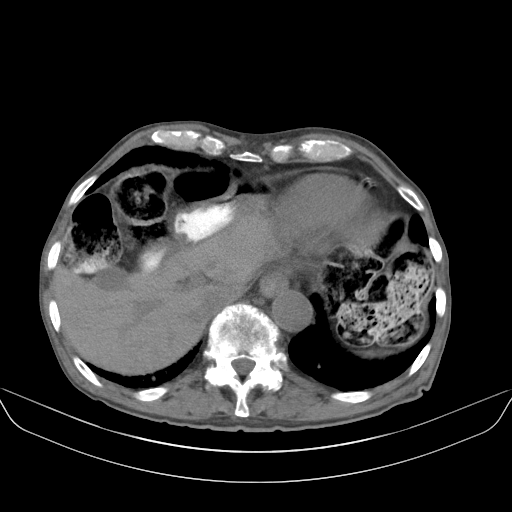
[im 75/98  lung]
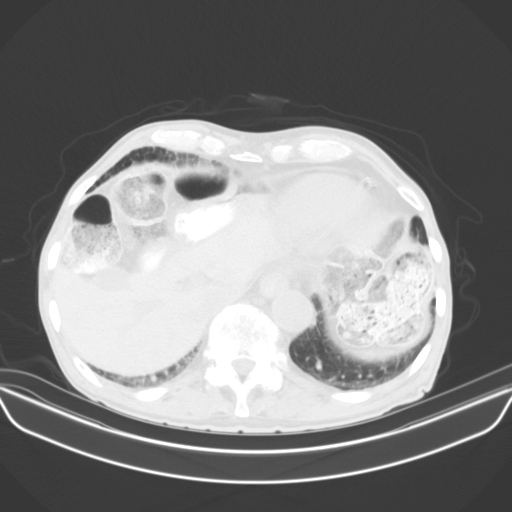
[im 80/98  lung]
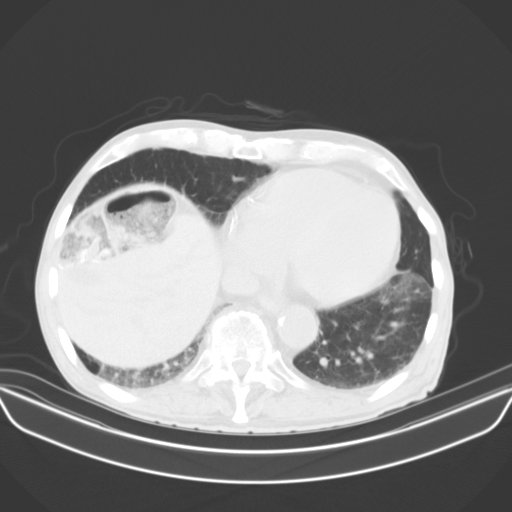
[im 86/98  soft-tissue]
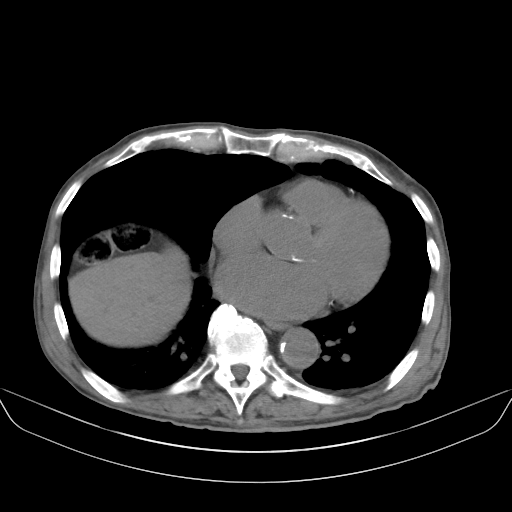
[im 86/98  lung]
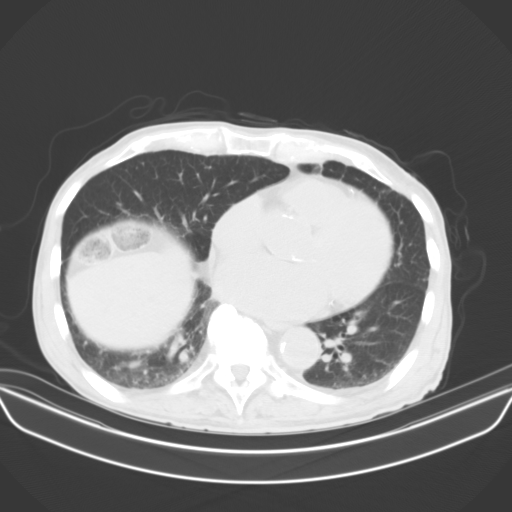
[im 92/98  soft-tissue]
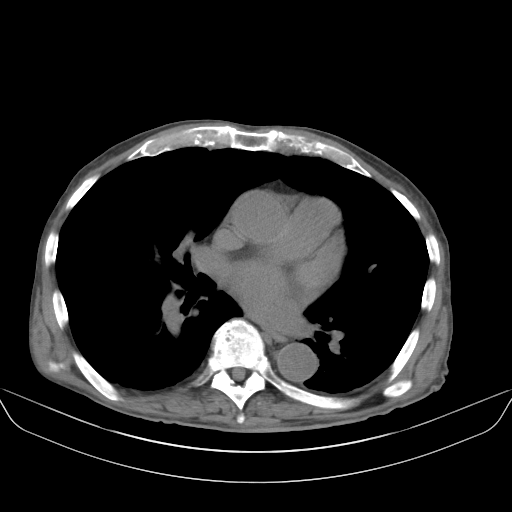
[im 92/98  lung]
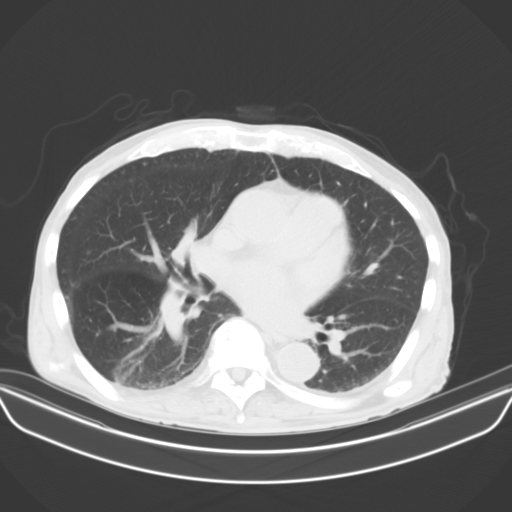

[14 of 32 positions shown; findings below may reference images not displayed]

FINDINGS: Lower chest: No acute abnormality. Bibasilar scarring with elevation
of the right hemidiaphragm. Three-vessel coronary artery
calcifications and/or stents.

Hepatobiliary: No solid liver abnormality is seen. No gallstones,
gallbladder wall thickening, or biliary dilatation.

Pancreas: Unremarkable. No pancreatic ductal dilatation or
surrounding inflammatory changes.

Spleen: Normal in size without significant abnormality.

Adrenals/Urinary Tract: Adrenal glands are unremarkable. Kidneys are
normal, without renal calculi, solid lesion, or hydronephrosis.
Bladder is unremarkable.

Stomach/Bowel: Stomach is within normal limits. Appendix appears
normal. No evidence of bowel wall thickening, distention, or
inflammatory changes. Generally large burden of stool throughout the
colon with stool balls in the rectum.

Vascular/Lymphatic: Aortic atherosclerosis. No enlarged abdominal or
pelvic lymph nodes.

Reproductive: No mass or other significant abnormality.

Other: No abdominal wall hernia or abnormality. No abdominopelvic
ascites.

Musculoskeletal: No acute or significant osseous findings.
IMPRESSION: 1. No acute noncontrast CT findings of the abdomen or pelvis to
explain weight loss.
2. Generally large burden of stool throughout the colon with stool
balls in the rectum.
3. Coronary artery disease.  Aortic Atherosclerosis ([id number]).

## 2020-08-24 DIAGNOSIS — Z79891 Long term (current) use of opiate analgesic: Secondary | ICD-10-CM | POA: Diagnosis not present

## 2020-08-24 DIAGNOSIS — N189 Chronic kidney disease, unspecified: Secondary | ICD-10-CM | POA: Diagnosis not present

## 2020-08-24 DIAGNOSIS — I130 Hypertensive heart and chronic kidney disease with heart failure and stage 1 through stage 4 chronic kidney disease, or unspecified chronic kidney disease: Secondary | ICD-10-CM | POA: Diagnosis not present

## 2020-08-24 DIAGNOSIS — N400 Enlarged prostate without lower urinary tract symptoms: Secondary | ICD-10-CM | POA: Diagnosis not present

## 2020-08-24 DIAGNOSIS — N529 Male erectile dysfunction, unspecified: Secondary | ICD-10-CM | POA: Diagnosis not present

## 2020-08-24 DIAGNOSIS — M109 Gout, unspecified: Secondary | ICD-10-CM | POA: Diagnosis not present

## 2020-08-24 DIAGNOSIS — Z87891 Personal history of nicotine dependence: Secondary | ICD-10-CM | POA: Diagnosis not present

## 2020-08-24 DIAGNOSIS — I5033 Acute on chronic diastolic (congestive) heart failure: Secondary | ICD-10-CM | POA: Diagnosis not present

## 2020-08-24 DIAGNOSIS — Z7982 Long term (current) use of aspirin: Secondary | ICD-10-CM | POA: Diagnosis not present

## 2020-08-24 DIAGNOSIS — J309 Allergic rhinitis, unspecified: Secondary | ICD-10-CM | POA: Diagnosis not present

## 2020-08-24 DIAGNOSIS — E785 Hyperlipidemia, unspecified: Secondary | ICD-10-CM | POA: Diagnosis not present

## 2020-08-31 DIAGNOSIS — I129 Hypertensive chronic kidney disease with stage 1 through stage 4 chronic kidney disease, or unspecified chronic kidney disease: Secondary | ICD-10-CM | POA: Diagnosis not present

## 2020-08-31 DIAGNOSIS — D472 Monoclonal gammopathy: Secondary | ICD-10-CM | POA: Diagnosis not present

## 2020-08-31 DIAGNOSIS — N1832 Chronic kidney disease, stage 3b: Secondary | ICD-10-CM | POA: Diagnosis not present

## 2020-08-31 DIAGNOSIS — I5032 Chronic diastolic (congestive) heart failure: Secondary | ICD-10-CM | POA: Diagnosis not present

## 2020-09-01 DIAGNOSIS — Z7982 Long term (current) use of aspirin: Secondary | ICD-10-CM

## 2020-09-01 DIAGNOSIS — N189 Chronic kidney disease, unspecified: Secondary | ICD-10-CM

## 2020-09-01 DIAGNOSIS — E785 Hyperlipidemia, unspecified: Secondary | ICD-10-CM

## 2020-09-01 DIAGNOSIS — I130 Hypertensive heart and chronic kidney disease with heart failure and stage 1 through stage 4 chronic kidney disease, or unspecified chronic kidney disease: Secondary | ICD-10-CM

## 2020-09-01 DIAGNOSIS — Z87891 Personal history of nicotine dependence: Secondary | ICD-10-CM

## 2020-09-01 DIAGNOSIS — M109 Gout, unspecified: Secondary | ICD-10-CM

## 2020-09-01 DIAGNOSIS — Z79891 Long term (current) use of opiate analgesic: Secondary | ICD-10-CM

## 2020-09-01 DIAGNOSIS — N400 Enlarged prostate without lower urinary tract symptoms: Secondary | ICD-10-CM

## 2020-09-01 DIAGNOSIS — N529 Male erectile dysfunction, unspecified: Secondary | ICD-10-CM

## 2020-09-01 DIAGNOSIS — I5033 Acute on chronic diastolic (congestive) heart failure: Secondary | ICD-10-CM

## 2020-09-01 DIAGNOSIS — J309 Allergic rhinitis, unspecified: Secondary | ICD-10-CM

## 2020-09-22 ENCOUNTER — Other Ambulatory Visit: Payer: Self-pay | Admitting: Internal Medicine

## 2020-09-22 NOTE — Telephone Encounter (Signed)
Please refill as per office routine med refill policy (all routine meds refilled for 3 mo or monthly per pt preference up to one year from last visit, then month to month grace period for 3 mo, then further med refills will have to be denied)  

## 2020-10-10 ENCOUNTER — Other Ambulatory Visit: Payer: Self-pay | Admitting: Internal Medicine

## 2020-10-10 NOTE — Telephone Encounter (Signed)
Please refill as per office routine med refill policy (all routine meds refilled for 3 mo or monthly per pt preference up to one year from last visit, then month to month grace period for 3 mo, then further med refills will have to be denied)  

## 2020-10-25 ENCOUNTER — Other Ambulatory Visit: Payer: Self-pay

## 2020-10-25 ENCOUNTER — Ambulatory Visit: Payer: Medicare Other | Admitting: Cardiovascular Disease

## 2020-10-25 ENCOUNTER — Encounter: Payer: Self-pay | Admitting: Cardiovascular Disease

## 2020-10-25 VITALS — BP 120/50 | HR 60 | Ht 69.0 in | Wt 151.2 lb

## 2020-10-25 DIAGNOSIS — I10 Essential (primary) hypertension: Secondary | ICD-10-CM | POA: Diagnosis not present

## 2020-10-25 DIAGNOSIS — I5032 Chronic diastolic (congestive) heart failure: Secondary | ICD-10-CM | POA: Diagnosis not present

## 2020-10-25 NOTE — Patient Instructions (Signed)
Medication Instructions:  Your physician recommends that you continue on your current medications as directed. Please refer to the Current Medication list given to you today.  *If you need a refill on your cardiac medications before your next appointment, please call your pharmacy*  Lab Work: NONE  Testing/Procedures: NONE  Follow-Up: At [facility], you and your health needs are our priority.  As part of our continuing mission to provide you with exceptional heart care, we have created designated Provider Care Teams.  These Care Teams include your primary Cardiologist (physician) and Advanced Practice Providers (APPs -  Physician Assistants and Nurse Practitioners) who all work together to provide you with the care you need, when you need it.  We recommend signing up for the patient portal called "MyChart".  Sign up information is provided on this After Visit Summary.  MyChart is used to connect with patients for Virtual Visits (Telemedicine).  Patients are able to view lab/test results, encounter notes, upcoming appointments, etc.  Non-urgent messages can be sent to your provider as well.   To learn more about what you can do with MyChart, go to [email or web address].    Your next appointment:   6 month(s)  The format for your next appointment:   In Person  Provider:   You may see DR [facility]  or one of the following Advanced Practice Providers on your designated Care Team:    Kerin Ransom, PA-C  [place], [place]  Coletta Memos, [place]

## 2020-10-25 NOTE — Progress Notes (Signed)
Cardiology Office Note   Date:  10/25/2020   ID:  Devin Hogan, DOB 09-23-1928, MRN [id number]  PCP:  Biagio Borg, MD  Cardiologist:   Skeet Latch, MD   No chief complaint on file.    History of Present Illness: Devin Hogan is a 86 y.o. male with hypertension, hyperlipidemia, mild ascending aortic aneurysm, MGUS, and CKD IV here for follow-up.  He was initially seen 07/2020 for the evaluation of edema.  Devin Hogan reported a couple weeks of increasing lower extremity edema.  BNP was elevated to 732.  It was recommended that he start taking furosemide.  However he was hesitant to do so.  Given his worsening kidney disease he was also referred to nephrology but has not seen them yet.  Overall he has felt well.  He has noted the edema but thinks it gets better by the morning.  He has no orthopnea or PND.  He denies any chest pain or pressure.  He does not get much exercise.  He uses a wheelchair when he has to walk long distances because his leg sometimes give out.  He lives with his daughter right now.  Typically he and his wife live at home but they have had some plumbing issues.  His wife has multiple sclerosis but is otherwise well.  He drinks about 3 waters daily.  He does not have many other beverages.  His daughter does try to limit the sodium intake in his food.  He does add salt to the food she cooks at times.  At his last appointment Devin Hogan was volume overloaded.  He was started on furosemide.  The dose was reduced due to worsening renal function.  He also had an echocardiogram 07/2020 that revealed LVEF 55 to 60% with grade 2 diastolic dysfunction.  There is mild much regurgitation and aortic valve sclerosis.  He was noted to have a mild ascending aortic aneurysm.  He followed up with Kerin Ransom, PA-C later that month and his edema had improved.  Atenolol has been discontinued due to bradycardia. He denies any lightheadedness or dizziness. He saw his nephrologist 09/2020.  At  that time creatinine was 2.34.  He was continued on furosemide 40 mg daily. His edema has been very stable. He has no orthopnea or PND. Overall he is without complaints today.  Past Medical History:  Diagnosis Date  . Acute gouty arthropathy 12/13/2010  . ALLERGIC RHINITIS 05/22/2007  . BENIGN PROSTATIC HYPERTROPHY 05/22/2007  . COLONIC POLYPS, HX OF 11/19/2007  . ERECTILE DYSFUNCTION 05/22/2007  . Gout 05/17/2011  . Heart failure, type unknown ([place]) 06/30/2020  . HYPERLIPIDEMIA 05/22/2007  . HYPERTENSION 05/22/2007  . RENAL INSUFFICIENCY 11/18/2009  . SKIN LESION 11/16/2010  . Systolic murmur 7/37/1062    Past Surgical History:  Procedure Laterality Date  . none       Current Outpatient Medications  Medication Sig Dispense Refill  . allopurinol (ZYLOPRIM) 100 MG tablet Take 0.5 tablets (50 mg total) by mouth daily. 90 tablet 1  . AMBULATORY NON FORMULARY MEDICATION Medication Name: prune lax 2-3 x week    . aspirin 81 MG EC tablet Take 81 mg by mouth daily.    . colchicine 0.6 MG tablet Take 0.5 tablets (0.3 mg total) by mouth daily as needed (gout flair). 30 tablet 2  . Cyanocobalamin (B-12) 1000 MCG TABS Take 1 tablet by mouth daily. 90 tablet 3  . diltiazem (CARDIZEM CD) 240 MG 24 hr capsule TAKE  1 CAPSULE BY MOUTH ONCE DAILY . APPOINTMENT REQUIRED FOR FUTURE REFILLS 90 capsule 0  . docusate sodium (COLACE) 100 MG capsule Take 100 mg by mouth 2 (two) times a week.    . furosemide (LASIX) 40 MG tablet Take 40 mg by mouth daily.    [name] HYDROcodone-acetaminophen (NORCO/VICODIN) 5-325 MG tablet Take 1 tablet by mouth every 6 (six) hours as needed for moderate pain. 15 tablet 0  . Hypromellose (ARTIFICIAL TEARS OP) Place 1 drop into both eyes daily as needed (dry eyes).    [name] lovastatin (MEVACOR) 20 MG tablet Take 1 tablet by mouth once daily 90 tablet 0   No current facility-administered medications for this visit.    Allergies:   Patient has no known allergies.    Social History:  The  patient  reports that he has quit smoking. He has never used smokeless tobacco. He reports previous alcohol use of about 17.0 standard drinks of alcohol per week. He reports that he does not use drugs.   Family History:  The patient's family history includes Lung cancer in his brother; Stroke in his sister.    ROS:  Please see the history of present illness.   Otherwise, review of systems are positive for none.   All other systems are reviewed and negative.    PHYSICAL EXAM: VS:  BP (!) 120/50   Pulse 60   Ht 5\' 9"  (1.753 m)   Wt 151 lb 3.2 oz (68.6 kg)   SpO2 97%   BMI 22.33 kg/m  , BMI Body mass index is 22.33 kg/m. GENERAL:  Well appearing HEENT: Pupils equal round and reactive, fundi not visualized, oral mucosa unremarkable NECK:  No jugular venous distention, waveform within normal limits, carotid upstroke brisk and symmetric, no bruits LUNGS:  Clear to auscultation bilaterally HEART:  RRR.  PMI not displaced or sustained,S1 and S2 within normal limits, no S3, no S4, no clicks, no rubs, no murmurs ABD:  Flat, positive bowel sounds normal in frequency in pitch, no bruits, no rebound, no guarding, no midline pulsatile mass, no hepatomegaly, no splenomegaly EXT:  2 plus pulses throughout, no edema, no cyanosis no clubbing SKIN:  No rashes no nodules NEURO:  Cranial nerves II through XII grossly intact, motor grossly intact throughout PSYCH:  Cognitively intact, oriented to person place and time  EKG:  EKG is ordered today. The ekg ordered today demonstrates sinus bradycardia.  Rate 56 bpm.  LAFB.  Right bundle branch block.  Echo 07/2020: 1. Left ventricular ejection fraction, by estimation, is 55 to 60%. The  left ventricle has normal function. The left ventricle has no regional  wall motion abnormalities. There is mild left ventricular hypertrophy.  Left ventricular diastolic parameters  are consistent with Grade II diastolic dysfunction (pseudonormalization).  Elevated left  atrial pressure.  2. Right ventricular systolic function is normal. The right ventricular  size is normal.  3. Left atrial size was severely dilated.  4. The mitral valve is normal in structure. Mild mitral valve  regurgitation. No evidence of mitral stenosis.  5. The aortic valve is tricuspid. Aortic valve regurgitation is mild.  Mild to moderate aortic valve sclerosis/calcification is present, without  any evidence of aortic stenosis.  6. Aortic dilatation noted. There is mild dilatation of the ascending  aorta measuring 39 mm.   Recent Labs: 05/03/2020: Pro B Natriuretic peptide (BNP) 376.0 06/22/2020: ALT 5; Brain Natriuretic Peptide 732; Hemoglobin 9.2; Platelets 104; TSH 3.55 07/29/2020: BUN 46; Creatinine, Ser 2.26;  Potassium 3.7; Sodium 147    Lipid Panel    Component Value Date/Time   CHOL 136 06/22/2020 1136   TRIG 57 06/22/2020 1136   HDL 55 06/22/2020 1136   CHOLHDL 2.5 06/22/2020 1136   VLDL 13.0 06/17/2019 1015   LDLCALC 68 06/22/2020 1136      Wt Readings from Last 3 Encounters:  10/25/20 151 lb 3.2 oz (68.6 kg)  07/29/20 156 lb (70.8 kg)  07/20/20 154 lb (69.9 kg)      ASSESSMENT AND PLAN:  # Chronic diastolic heart failure: # Essential hypertension:  # Bradycardia:  Volume status is much better. He is doing very well and without complaint. He is euvolemic on exam. Renal function is stable. Continue diltiazem and furosemide. He is no longer on atenolol. Heart rate is improved.  # Ascending aorta aneurysm:   3.9 cm on echo 07/2020. Given his age, no plans for surveillance.  # Hyperlipidemia: Continue lovastatin. Lipids are well-controlled.  # CKD IV:  Creatinine has ranged from 1.6-2.6 and is stable. He follows with nephrology and is deemed a poor candidate for dialysis. Continue furosemide 40 mg daily.  Avoid other nephrotoxins.  Current medicines are reviewed at length with the patient today.  The patient does not have concerns regarding  medicines.  The following changes have been made:  no change  Labs/ tests ordered today include:   No orders of the defined types were placed in this encounter.    Disposition:   FU with Sherae Santino C. Oval Linsey, MD, [facility] orin 6 months..     Signed, Nemesis Rainwater C. Oval Linsey, MD, [facility]  10/25/2020 10:32 AM    [facility]

## 2020-11-16 ENCOUNTER — Inpatient Hospital Stay (HOSPITAL_COMMUNITY)
Admission: EM | Admit: 2020-11-16 | Discharge: 2020-12-06 | DRG: 082 | Disposition: E | Payer: Medicare Other | Attending: Internal Medicine | Admitting: Internal Medicine

## 2020-11-16 DIAGNOSIS — I130 Hypertensive heart and chronic kidney disease with heart failure and stage 1 through stage 4 chronic kidney disease, or unspecified chronic kidney disease: Secondary | ICD-10-CM | POA: Diagnosis present

## 2020-11-16 DIAGNOSIS — Z823 Family history of stroke: Secondary | ICD-10-CM

## 2020-11-16 DIAGNOSIS — N390 Urinary tract infection, site not specified: Secondary | ICD-10-CM | POA: Diagnosis present

## 2020-11-16 DIAGNOSIS — Z66 Do not resuscitate: Secondary | ICD-10-CM | POA: Diagnosis not present

## 2020-11-16 DIAGNOSIS — R402422 Glasgow coma scale score 9-12, at arrival to emergency department: Secondary | ICD-10-CM | POA: Diagnosis present

## 2020-11-16 DIAGNOSIS — D472 Monoclonal gammopathy: Secondary | ICD-10-CM | POA: Diagnosis present

## 2020-11-16 DIAGNOSIS — R0902 Hypoxemia: Secondary | ICD-10-CM | POA: Diagnosis not present

## 2020-11-16 DIAGNOSIS — N184 Chronic kidney disease, stage 4 (severe): Secondary | ICD-10-CM | POA: Diagnosis present

## 2020-11-16 DIAGNOSIS — F0390 Unspecified dementia without behavioral disturbance: Secondary | ICD-10-CM | POA: Diagnosis present

## 2020-11-16 DIAGNOSIS — N179 Acute kidney failure, unspecified: Secondary | ICD-10-CM

## 2020-11-16 DIAGNOSIS — S066X0A Traumatic subarachnoid hemorrhage without loss of consciousness, initial encounter: Secondary | ICD-10-CM

## 2020-11-16 DIAGNOSIS — S066X9A Traumatic subarachnoid hemorrhage with loss of consciousness of unspecified duration, initial encounter: Secondary | ICD-10-CM | POA: Diagnosis not present

## 2020-11-16 DIAGNOSIS — S065X9A Traumatic subdural hemorrhage with loss of consciousness of unspecified duration, initial encounter: Secondary | ICD-10-CM | POA: Diagnosis present

## 2020-11-16 DIAGNOSIS — Y92009 Unspecified place in unspecified non-institutional (private) residence as the place of occurrence of the external cause: Secondary | ICD-10-CM

## 2020-11-16 DIAGNOSIS — R52 Pain, unspecified: Secondary | ICD-10-CM | POA: Diagnosis not present

## 2020-11-16 DIAGNOSIS — M109 Gout, unspecified: Secondary | ICD-10-CM | POA: Diagnosis present

## 2020-11-16 DIAGNOSIS — U071 COVID-19: Secondary | ICD-10-CM | POA: Diagnosis present

## 2020-11-16 DIAGNOSIS — D6959 Other secondary thrombocytopenia: Secondary | ICD-10-CM | POA: Diagnosis present

## 2020-11-16 DIAGNOSIS — I10 Essential (primary) hypertension: Secondary | ICD-10-CM | POA: Diagnosis present

## 2020-11-16 DIAGNOSIS — W19XXXA Unspecified fall, initial encounter: Secondary | ICD-10-CM

## 2020-11-16 DIAGNOSIS — R569 Unspecified convulsions: Secondary | ICD-10-CM | POA: Diagnosis present

## 2020-11-16 DIAGNOSIS — Z515 Encounter for palliative care: Secondary | ICD-10-CM

## 2020-11-16 DIAGNOSIS — I609 Nontraumatic subarachnoid hemorrhage, unspecified: Secondary | ICD-10-CM

## 2020-11-16 DIAGNOSIS — D61818 Other pancytopenia: Secondary | ICD-10-CM | POA: Diagnosis present

## 2020-11-16 DIAGNOSIS — I5032 Chronic diastolic (congestive) heart failure: Secondary | ICD-10-CM | POA: Diagnosis present

## 2020-11-16 DIAGNOSIS — B9620 Unspecified Escherichia coli [E. coli] as the cause of diseases classified elsewhere: Secondary | ICD-10-CM | POA: Diagnosis present

## 2020-11-16 DIAGNOSIS — G9349 Other encephalopathy: Secondary | ICD-10-CM | POA: Diagnosis present

## 2020-11-16 DIAGNOSIS — Z87891 Personal history of nicotine dependence: Secondary | ICD-10-CM

## 2020-11-16 DIAGNOSIS — W010XXA Fall on same level from slipping, tripping and stumbling without subsequent striking against object, initial encounter: Secondary | ICD-10-CM | POA: Diagnosis present

## 2020-11-16 DIAGNOSIS — Z79899 Other long term (current) drug therapy: Secondary | ICD-10-CM

## 2020-11-16 DIAGNOSIS — Z7982 Long term (current) use of aspirin: Secondary | ICD-10-CM

## 2020-11-16 DIAGNOSIS — Z801 Family history of malignant neoplasm of trachea, bronchus and lung: Secondary | ICD-10-CM

## 2020-11-16 NOTE — ED Provider Notes (Signed)
[facility] EMERGENCY DEPARTMENT Provider Note   CSN: [id number] Arrival date & time: 11/08/2020  2349   History Chief Complaint  Patient presents with  . Fall    Devin Hogan is a 87 y.o. male.  The history is provided by the EMS personnel. The history is limited by the condition of the patient (Altered mental status).  He has history of hypertension, hyperlipidemia, renal insufficiency and is brought in by ambulance after having had 2 falls at home today.  Apparently, he had a fall earlier in the day and EMS came out to the home and he refused transport.  This evening, family heard a thump and found him on the floor and was poorly responsive and ambulance was called and brought him into the ED.  EMS reports family states he is normally awake and talkative and is essentially nonverbal.  Past Medical History:  Diagnosis Date  . Acute gouty arthropathy 12/13/2010  . ALLERGIC RHINITIS 05/22/2007  . BENIGN PROSTATIC HYPERTROPHY 05/22/2007  . COLONIC POLYPS, HX OF 11/19/2007  . ERECTILE DYSFUNCTION 05/22/2007  . Gout 05/17/2011  . Heart failure, type unknown ([place]) 06/30/2020  . HYPERLIPIDEMIA 05/22/2007  . HYPERTENSION 05/22/2007  . RENAL INSUFFICIENCY 11/18/2009  . SKIN LESION 11/16/2010  . Systolic murmur 6/81/2751    Patient Active Problem List   Diagnosis Date Noted  . Chronic diastolic heart failure ([place]) 07/29/2020  . Systolic murmur 70/11/7492  . Elevated brain natriuretic peptide (BNP) level 06/22/2020  . Gait disorder 06/22/2020  . Pain and swelling of left knee 03/24/2020  . Vitamin D deficiency 12/23/2019  . Vitamin B12 deficiency 12/23/2019  . Pancytopenia ([place]) 12/16/2018  . Cough 12/11/2017  . Fall 12/11/2017  . MGUS (monoclonal gammopathy of unknown significance) 06/06/2017  . Dizziness 03/27/2017  . Pyuria 12/07/2016  . Encounter for well adult exam with abnormal findings 12/09/2015  . UTI (lower urinary tract infection) 05/31/2012  . Nocturia  05/31/2012  . Thrombocytopenia ([place]) 12/05/2011  . Gout 05/17/2011  . CRI (chronic renal insufficiency), stage 4 (severe) ([place]) 11/18/2009  . COLONIC POLYPS, HX OF 11/19/2007  . Hyperlipidemia 05/22/2007  . ERECTILE DYSFUNCTION 05/22/2007  . Essential hypertension 05/22/2007  . ALLERGIC RHINITIS 05/22/2007  . BENIGN PROSTATIC HYPERTROPHY 05/22/2007    Past Surgical History:  Procedure Laterality Date  . none         Family History  Problem Relation Age of Onset  . Lung cancer Brother   . Stroke Sister   . Pancreatic cancer Neg Hx   . Stomach cancer Neg Hx   . Colon cancer Neg Hx   . Esophageal cancer Neg Hx     Social History   Tobacco Use  . Smoking status: Former Research scientist (life sciences)  . Smokeless tobacco: Never Used  . Tobacco comment: quit when he was very yourg  Vaping Use  . Vaping Use: Never used  Substance Use Topics  . Alcohol use: Not Currently    Alcohol/week: 17.0 standard drinks    Types: 1 Glasses of wine, 4 Cans of beer, 12 Shots of liquor per week    Comment: "couple of drinks per day" takes drink on the weekend; no longer drinks alcohol as of around 2019 (per pt 07/20/20)  . Drug use: No    Home Medications Prior to Admission medications   Medication Sig Start Date End Date Taking? Authorizing Provider  allopurinol (ZYLOPRIM) 100 MG tablet Take 0.5 tablets (50 mg total) by mouth daily. 04/07/20   Lynne Leader  S, MD  AMBULATORY NON FORMULARY MEDICATION Medication Name: prune lax 2-3 x week    [provider]  aspirin 81 MG EC tablet Take 81 mg by mouth daily.    [provider]  colchicine 0.6 MG tablet Take 0.5 tablets (0.3 mg total) by mouth daily as needed (gout flair). 03/31/20   Gregor Hams, MD  Cyanocobalamin (B-12) 1000 MCG TABS Take 1 tablet by mouth daily. 06/17/19   Biagio Borg, MD  diltiazem (CARDIZEM CD) 240 MG 24 hr capsule TAKE 1 CAPSULE BY MOUTH ONCE DAILY . APPOINTMENT REQUIRED FOR FUTURE REFILLS 10/11/20   Biagio Borg, MD   docusate sodium (COLACE) 100 MG capsule Take 100 mg by mouth 2 (two) times a week.    [provider]  furosemide (LASIX) 40 MG tablet Take 40 mg by mouth daily.    [provider]  HYDROcodone-acetaminophen (NORCO/VICODIN) 5-325 MG tablet Take 1 tablet by mouth every 6 (six) hours as needed for moderate pain. 03/31/20   Gregor Hams, MD  Hypromellose (ARTIFICIAL TEARS OP) Place 1 drop into both eyes daily as needed (dry eyes).    [provider]  lovastatin (MEVACOR) 20 MG tablet Take 1 tablet by mouth once daily 09/22/20   Biagio Borg, MD    Allergies    Patient has no known allergies.  Review of Systems   Review of Systems  Unable to perform ROS: Mental status change    Physical Exam Updated Vital Signs There were no vitals taken for this visit.  Physical Exam Vitals and nursing note reviewed.   87 year old male, resting comfortably and in no acute distress. Vital signs are significant for elevated blood pressure. Oxygen saturation is 96%, which is normal. Head is normocephalic.  Small hematoma present on the left side of the forehead.  Pupils 4 mm and reactive. Oropharynx is clear. Neck is immobilized in a stiff cervical collar and is nontender without adenopathy or JVD. Back is nontender and there is no CVA tenderness. Lungs are clear without rales, wheezes, or rhonchi. Chest is nontender. Heart has regular rate and rhythm without murmur. Abdomen is soft, flat, nontender without masses or hepatosplenomegaly and peristalsis is normoactive. Extremities have no cyanosis or edema, full range of motion is present. Skin is warm and dry without rash. Neurologic: Awake and will open eyes to voice but will not follow commands.  Cranial nerves are grossly intact.  Generally increased muscle tone in extremities but moves all extremities equally.  No focal findings.  ED Results / Procedures / Treatments   Labs (all labs ordered are listed, but only abnormal  results are displayed) Labs Reviewed  COMPREHENSIVE METABOLIC PANEL - Abnormal; Notable for the following components:      Result Value   CO2 19 (*)    BUN 48 (*)    Creatinine, Ser 2.91 (*)    GFR, Estimated 20 (*)    All other components within normal limits  CBC WITH DIFFERENTIAL/PLATELET - Abnormal; Notable for the following components:   WBC 2.5 (*)    RBC 3.37 (*)    Hemoglobin 10.6 (*)    HCT 32.5 (*)    RDW 16.3 (*)    Platelets 73 (*)    Neutro Abs 1.2 (*)    All other components within normal limits  SARS CORONAVIRUS 2 (TAT 6-24 HRS)  LACTIC ACID, PLASMA  URINALYSIS, ROUTINE W REFLEX MICROSCOPIC    EKG EKG Interpretation  Date/Time:  Thursday November 17 2020 00:01:14 EST Ventricular Rate:  73 PR Interval:    QRS Duration: 140 QT Interval:  417 QTC Calculation: 460 R Axis:   -46 Text Interpretation: Sinus rhythm IVCD, consider atypical RBBB LVH with IVCD and secondary repol abnrm When compared with ECG of 03/25/2017, No significant change was found Confirmed by Delora Fuel ([id number]) on 11/17/2020 12:05:35 AM   Radiology CT Head Wo Contrast  Result Date: 11/17/2020 CLINICAL DATA:  Found down unresponsive EXAM: CT HEAD WITHOUT CONTRAST CT CERVICAL SPINE WITHOUT CONTRAST TECHNIQUE: Multidetector CT imaging of the head and cervical spine was performed following the standard protocol without intravenous contrast. Multiplanar CT image reconstructions of the cervical spine were also generated. COMPARISON:  None. FINDINGS: CT HEAD FINDINGS Brain: There is moderate volume subarachnoid hemorrhage scattered over both hemispheres, in the right sylvian fissure and in the occipital horn of the left lateral ventricle. There is no midline shift or other mass effect. There is periventricular hypoattenuation compatible with chronic microvascular disease. Vascular: Atherosclerotic calcification of the vertebral and internal carotid arteries at the skull base. No abnormal hyperdensity of the  major intracranial arteries or dural venous sinuses. Skull: The visualized skull base, calvarium and extracranial soft tissues are normal. Sinuses/Orbits: No fluid levels or advanced mucosal thickening of the visualized paranasal sinuses. No mastoid or middle ear effusion. The orbits are normal. CT CERVICAL SPINE FINDINGS Alignment: No acute subluxation. Grade 1 retrolisthesis at C3-4 and C4-5. Skull base and vertebrae: No acute fracture. Soft tissues and spinal canal: No prevertebral fluid or swelling. No visible canal hematoma. Disc levels: Multilevel facet hypertrophy. Generalized disc space loss. No high-grade stenosis. Upper chest: No pneumothorax, pulmonary nodule or pleural effusion. Other: Normal visualized paraspinal cervical soft tissues. IMPRESSION: 1. Moderate volume subarachnoid hemorrhage scattered over both hemispheres, in the right Sylvian fissure and in the occipital horn of the left lateral ventricle. No midline shift or other mass effect. 2. No acute fracture or acute subluxation of the cervical spine. Critical Value/emergent results were called by telephone at the time of interpretation on 11/17/2020 at 1:44 am to provider Kosta Schnitzler [facility] , who verbally acknowledged these results. Electronically Signed   By: Ulyses Jarred M.D.   On: 11/17/2020 01:45   CT Cervical Spine Wo Contrast  Result Date: 11/17/2020 CLINICAL DATA:  Found down unresponsive EXAM: CT HEAD WITHOUT CONTRAST CT CERVICAL SPINE WITHOUT CONTRAST TECHNIQUE: Multidetector CT imaging of the head and cervical spine was performed following the standard protocol without intravenous contrast. Multiplanar CT image reconstructions of the cervical spine were also generated. COMPARISON:  None. FINDINGS: CT HEAD FINDINGS Brain: There is moderate volume subarachnoid hemorrhage scattered over both hemispheres, in the right sylvian fissure and in the occipital horn of the left lateral ventricle. There is no midline shift or other mass effect. There  is periventricular hypoattenuation compatible with chronic microvascular disease. Vascular: Atherosclerotic calcification of the vertebral and internal carotid arteries at the skull base. No abnormal hyperdensity of the major intracranial arteries or dural venous sinuses. Skull: The visualized skull base, calvarium and extracranial soft tissues are normal. Sinuses/Orbits: No fluid levels or advanced mucosal thickening of the visualized paranasal sinuses. No mastoid or middle ear effusion. The orbits are normal. CT CERVICAL SPINE FINDINGS Alignment: No acute subluxation. Grade 1 retrolisthesis at C3-4 and C4-5. Skull base and vertebrae: No acute fracture. Soft tissues and spinal canal: No prevertebral fluid or swelling. No visible canal hematoma. Disc levels: Multilevel facet hypertrophy. Generalized disc space loss. No high-grade  stenosis. Upper chest: No pneumothorax, pulmonary nodule or pleural effusion. Other: Normal visualized paraspinal cervical soft tissues. IMPRESSION: 1. Moderate volume subarachnoid hemorrhage scattered over both hemispheres, in the right Sylvian fissure and in the occipital horn of the left lateral ventricle. No midline shift or other mass effect. 2. No acute fracture or acute subluxation of the cervical spine. Critical Value/emergent results were called by telephone at the time of interpretation on 11/17/2020 at 1:44 am to provider Ananth Fiallos [facility] , who verbally acknowledged these results. Electronically Signed   By: Ulyses Jarred M.D.   On: 11/17/2020 01:45   DG Chest Port 1 View  Result Date: 11/17/2020 CLINICAL DATA:  Fall, altered mental status EXAM: PORTABLE CHEST 1 VIEW COMPARISON:  12/11/2017 FINDINGS: No consolidation or convincing features of edema. No pneumothorax or effusion. Some chronically coarsened interstitial and bronchitic changes are similar to prior. Nonspecific elevation the right hemidiaphragm is unchanged from comparison studies. Some diminished lung volumes and  atelectatic changes are noted. The aorta is calcified. The remaining cardiomediastinal contours are unchanged from prior counting for differences in technique. No acute osseous or soft tissue abnormality. Degenerative changes are present in the imaged spine and shoulders. Telemetry leads overlie the chest. IMPRESSION: 1. Low volumes and atelectasis without other cardiopulmonary or traumatic findings in the chest. 2. Chronically coarsened interstitial and bronchitic changes with unchanged elevation of the right hemidiaphragm. Electronically Signed   By: Lovena Le M.D.   On: 11/17/2020 00:17    Procedures Procedures  CRITICAL CARE Performed by: Delora Fuel Total critical care time: 35 minutes Critical care time was exclusive of separately billable procedures and treating other patients. Critical care was necessary to treat or prevent imminent or life-threatening deterioration. Critical care was time spent personally by me on the following activities: development of treatment plan with patient and/or surrogate as well as nursing, discussions with consultants, evaluation of patient's response to treatment, examination of patient, obtaining history from patient or surrogate, ordering and performing treatments and interventions, ordering and review of laboratory studies, ordering and review of radiographic studies, pulse oximetry and re-evaluation of patient's condition.  Medications Ordered in ED Medications - No data to display  ED Course  I have reviewed the triage vital signs and the nursing notes.  Pertinent labs & imaging results that were available during my care of the patient were reviewed by me and considered in my medical decision making (see chart for details).  MDM Rules/Calculators/A&P 2 falls at home with decreased level of consciousness.  He is not on any anticoagulants.  Old records were reviewed, and he has no relevant past visits.  Baseline creatinine is approximately 2.2.  He  will be sent for CT of head and cervical spine, but will also be evaluated for metabolic causes for altered mentation.  CT scan shows subarachnoid hemorrhage which is felt to be related to trauma and not aneurysm. Labs show worsening renal function, also pancytopenia which is stable compared with prior. Platelet count is not critical. I discussed the CT findings with Ms. Meyran, on-call for neurosurgery who has reviewed MRI and feels there is no surgical intervention needed. She requested patient to be admitted to medicine service and they will follow in consult. Case is discussed with Dr. Hal Hope [facility] hospitalist, who agrees to admit the patient.  Final Clinical Impression(s) / ED Diagnoses Final diagnoses:  Fall at home, initial encounter  Subarachnoid hemorrhage following injury, no loss of consciousness, initial encounter ([place])  Acute kidney injury (nontraumatic) ([place])  Pancytopenia [facility])    Rx / DC Orders ED Discharge Orders    None       Delora Fuel, MD 34/48/30 [phone]

## 2020-11-16 NOTE — ED Triage Notes (Signed)
Per ems, pt coming from home. Pt had a fall earlier today and refused transport. This evening pt was attempting to get up out of his bed and family heard a bang, found pt unresponsive on floor. Small hematoma noted to rt side of head. Not on blood thinners. Family poor historians. C-collar applied by ems. Pt is baseline GCS 15, A&Ox4 per family. EMS lungs clear, VS WNL. Pt's 12 lead with ems was abnormal. Hx of Afib and HTN.   EMS CBG 100, SpO2 96%, 16-18R, BP 150/96, HR 70 when awake with runs of bradycardia 40-50 with decreased gcs. GCS 9-13.18g LAC.

## 2020-11-16 NOTE — ED Triage Notes (Incomplete)
Per ems, pt coming from home. Pt had a fall earlier today and refused transport. This evening pt was attempting to get up out of his bed and family heard a bang, found pt unresponsive on floor. Small hematoma noted to rt side of head. Not on blood thinners. Family poor historians. C-collar applied by ems. Pt is baseline GCS 15, A&Ox4 per family. EMS lungs clear, VS WNL. Pt's 12 lead with ems was abnormal. Hx of Afib and HTN.   EMS CBG 100, SpO2 96%, 16-18R, BP 150/96, HR 70 when awake with runs of bradycardia 40-50 with decreased gcs. GCS 9-13.18g LAC.

## 2020-11-16 NOTE — ED Provider Notes (Incomplete)
[facility] EMERGENCY DEPARTMENT Provider Note   CSN: [id number] Arrival date & time: 11/29/2020  2349   History No chief complaint on file.   Devin Hogan is a 87 y.o. male.  The history is provided by the patient.    Past Medical History:  Diagnosis Date  . Acute gouty arthropathy 12/13/2010  . ALLERGIC RHINITIS 05/22/2007  . BENIGN PROSTATIC HYPERTROPHY 05/22/2007  . COLONIC POLYPS, HX OF 11/19/2007  . ERECTILE DYSFUNCTION 05/22/2007  . Gout 05/17/2011  . Heart failure, type unknown ([place]) 06/30/2020  . HYPERLIPIDEMIA 05/22/2007  . HYPERTENSION 05/22/2007  . RENAL INSUFFICIENCY 11/18/2009  . SKIN LESION 11/16/2010  . Systolic murmur 01/24/253    Patient Active Problem List   Diagnosis Date Noted  . Chronic diastolic heart failure ([place]) 07/29/2020  . Systolic murmur 27/04/2375  . Elevated brain natriuretic peptide (BNP) level 06/22/2020  . Gait disorder 06/22/2020  . Pain and swelling of left knee 03/24/2020  . Vitamin D deficiency 12/23/2019  . Vitamin B12 deficiency 12/23/2019  . Pancytopenia ([place]) 12/16/2018  . Cough 12/11/2017  . Fall 12/11/2017  . MGUS (monoclonal gammopathy of unknown significance) 06/06/2017  . Dizziness 03/27/2017  . Pyuria 12/07/2016  . Encounter for well adult exam with abnormal findings 12/09/2015  . UTI (lower urinary tract infection) 05/31/2012  . Nocturia 05/31/2012  . Thrombocytopenia ([place]) 12/05/2011  . Gout 05/17/2011  . CRI (chronic renal insufficiency), stage 4 (severe) ([place]) 11/18/2009  . COLONIC POLYPS, HX OF 11/19/2007  . Hyperlipidemia 05/22/2007  . ERECTILE DYSFUNCTION 05/22/2007  . Essential hypertension 05/22/2007  . ALLERGIC RHINITIS 05/22/2007  . BENIGN PROSTATIC HYPERTROPHY 05/22/2007    Past Surgical History:  Procedure Laterality Date  . none         Family History  Problem Relation Age of Onset  . Lung cancer Brother   . Stroke Sister   . Pancreatic cancer Neg Hx   . Stomach cancer Neg Hx    . Colon cancer Neg Hx   . Esophageal cancer Neg Hx     Social History   Tobacco Use  . Smoking status: Former Research scientist (life sciences)  . Smokeless tobacco: Never Used  . Tobacco comment: quit when he was very yourg  Vaping Use  . Vaping Use: Never used  Substance Use Topics  . Alcohol use: Not Currently    Alcohol/week: 17.0 standard drinks    Types: 1 Glasses of wine, 4 Cans of beer, 12 Shots of liquor per week    Comment: "couple of drinks per day" takes drink on the weekend; no longer drinks alcohol as of around 2019 (per pt 07/20/20)  . Drug use: No    Home Medications Prior to Admission medications   Medication Sig Start Date End Date Taking? Authorizing Provider  allopurinol (ZYLOPRIM) 100 MG tablet Take 0.5 tablets (50 mg total) by mouth daily. 04/07/20   Gregor Hams, MD  AMBULATORY NON FORMULARY MEDICATION Medication Name: prune lax 2-3 x week    [provider]  aspirin 81 MG EC tablet Take 81 mg by mouth daily.    [provider]  colchicine 0.6 MG tablet Take 0.5 tablets (0.3 mg total) by mouth daily as needed (gout flair). 03/31/20   Gregor Hams, MD  Cyanocobalamin (B-12) 1000 MCG TABS Take 1 tablet by mouth daily. 06/17/19   Biagio Borg, MD  diltiazem (CARDIZEM CD) 240 MG 24 hr capsule TAKE 1 CAPSULE BY MOUTH ONCE DAILY . APPOINTMENT REQUIRED FOR FUTURE  REFILLS 10/11/20   Biagio Borg, MD  docusate sodium (COLACE) 100 MG capsule Take 100 mg by mouth 2 (two) times a week.    [provider]  furosemide (LASIX) 40 MG tablet Take 40 mg by mouth daily.    [provider]  HYDROcodone-acetaminophen (NORCO/VICODIN) 5-325 MG tablet Take 1 tablet by mouth every 6 (six) hours as needed for moderate pain. 03/31/20   Gregor Hams, MD  Hypromellose (ARTIFICIAL TEARS OP) Place 1 drop into both eyes daily as needed (dry eyes).    [provider]  lovastatin (MEVACOR) 20 MG tablet Take 1 tablet by mouth once daily 09/22/20   Biagio Borg, MD     Allergies    Patient has no known allergies.  Review of Systems   Review of Systems  All other systems reviewed and are negative.   Physical Exam Updated Vital Signs There were no vitals taken for this visit.  Physical Exam Vitals and nursing note reviewed.   *** year old ***male, resting comfortably and in no acute distress. Vital signs are ***. Oxygen saturation is ***%, which is normal. Head is normocephalic and atraumatic. PERRLA, EOMI. Oropharynx is clear. Neck is nontender and supple without adenopathy or JVD. Back is nontender and there is no CVA tenderness. Lungs are clear without rales, wheezes, or rhonchi. Chest is nontender. Heart has regular rate and rhythm without murmur. Abdomen is soft, flat, nontender without masses or hepatosplenomegaly and peristalsis is normoactive. Extremities have no cyanosis or edema, full range of motion is present. Skin is warm and dry without rash. Neurologic: Mental status is normal, cranial nerves are intact, there are no motor or sensory deficits.  ED Results / Procedures / Treatments   Labs (all labs ordered are listed, but only abnormal results are displayed) Labs Reviewed - No data to display  EKG None  Radiology No results found.  Procedures Procedures (including critical care time)  Medications Ordered in ED Medications - No data to display  ED Course  I have reviewed the triage vital signs and the nursing notes.  Pertinent labs & imaging results that were available during my care of the patient were reviewed by me and considered in my medical decision making (see chart for details).    MDM Rules/Calculators/A&P                          *** Final Clinical Impression(s) / ED Diagnoses Final diagnoses:  None    Rx / DC Orders ED Discharge Orders    None

## 2020-11-17 ENCOUNTER — Encounter (HOSPITAL_COMMUNITY): Payer: Self-pay | Admitting: Emergency Medicine

## 2020-11-17 ENCOUNTER — Emergency Department (HOSPITAL_COMMUNITY): Payer: Medicare Other

## 2020-11-17 ENCOUNTER — Other Ambulatory Visit: Payer: Self-pay

## 2020-11-17 DIAGNOSIS — B9620 Unspecified Escherichia coli [E. coli] as the cause of diseases classified elsewhere: Secondary | ICD-10-CM | POA: Diagnosis present

## 2020-11-17 DIAGNOSIS — W010XXA Fall on same level from slipping, tripping and stumbling without subsequent striking against object, initial encounter: Secondary | ICD-10-CM | POA: Diagnosis present

## 2020-11-17 DIAGNOSIS — S065X9A Traumatic subdural hemorrhage with loss of consciousness of unspecified duration, initial encounter: Secondary | ICD-10-CM | POA: Diagnosis present

## 2020-11-17 DIAGNOSIS — Z87891 Personal history of nicotine dependence: Secondary | ICD-10-CM | POA: Diagnosis not present

## 2020-11-17 DIAGNOSIS — D61818 Other pancytopenia: Secondary | ICD-10-CM | POA: Diagnosis present

## 2020-11-17 DIAGNOSIS — Z7982 Long term (current) use of aspirin: Secondary | ICD-10-CM | POA: Diagnosis not present

## 2020-11-17 DIAGNOSIS — R402422 Glasgow coma scale score 9-12, at arrival to emergency department: Secondary | ICD-10-CM | POA: Diagnosis present

## 2020-11-17 DIAGNOSIS — I609 Nontraumatic subarachnoid hemorrhage, unspecified: Secondary | ICD-10-CM

## 2020-11-17 DIAGNOSIS — I6200 Nontraumatic subdural hemorrhage, unspecified: Secondary | ICD-10-CM | POA: Diagnosis not present

## 2020-11-17 DIAGNOSIS — R569 Unspecified convulsions: Secondary | ICD-10-CM | POA: Diagnosis present

## 2020-11-17 DIAGNOSIS — Z79899 Other long term (current) drug therapy: Secondary | ICD-10-CM | POA: Diagnosis not present

## 2020-11-17 DIAGNOSIS — G9349 Other encephalopathy: Secondary | ICD-10-CM | POA: Diagnosis present

## 2020-11-17 DIAGNOSIS — F0390 Unspecified dementia without behavioral disturbance: Secondary | ICD-10-CM | POA: Diagnosis present

## 2020-11-17 DIAGNOSIS — I130 Hypertensive heart and chronic kidney disease with heart failure and stage 1 through stage 4 chronic kidney disease, or unspecified chronic kidney disease: Secondary | ICD-10-CM | POA: Diagnosis present

## 2020-11-17 DIAGNOSIS — Z801 Family history of malignant neoplasm of trachea, bronchus and lung: Secondary | ICD-10-CM | POA: Diagnosis not present

## 2020-11-17 DIAGNOSIS — Z66 Do not resuscitate: Secondary | ICD-10-CM | POA: Diagnosis not present

## 2020-11-17 DIAGNOSIS — S066X9A Traumatic subarachnoid hemorrhage with loss of consciousness of unspecified duration, initial encounter: Secondary | ICD-10-CM | POA: Diagnosis present

## 2020-11-17 DIAGNOSIS — W19XXXA Unspecified fall, initial encounter: Secondary | ICD-10-CM | POA: Diagnosis not present

## 2020-11-17 DIAGNOSIS — N179 Acute kidney failure, unspecified: Secondary | ICD-10-CM | POA: Diagnosis present

## 2020-11-17 DIAGNOSIS — M109 Gout, unspecified: Secondary | ICD-10-CM | POA: Diagnosis present

## 2020-11-17 DIAGNOSIS — N390 Urinary tract infection, site not specified: Secondary | ICD-10-CM | POA: Diagnosis present

## 2020-11-17 DIAGNOSIS — N184 Chronic kidney disease, stage 4 (severe): Secondary | ICD-10-CM | POA: Diagnosis present

## 2020-11-17 DIAGNOSIS — D6959 Other secondary thrombocytopenia: Secondary | ICD-10-CM | POA: Diagnosis present

## 2020-11-17 DIAGNOSIS — Y92009 Unspecified place in unspecified non-institutional (private) residence as the place of occurrence of the external cause: Secondary | ICD-10-CM | POA: Diagnosis not present

## 2020-11-17 DIAGNOSIS — S065X0A Traumatic subdural hemorrhage without loss of consciousness, initial encounter: Secondary | ICD-10-CM | POA: Diagnosis not present

## 2020-11-17 DIAGNOSIS — I619 Nontraumatic intracerebral hemorrhage, unspecified: Secondary | ICD-10-CM | POA: Diagnosis not present

## 2020-11-17 DIAGNOSIS — S066X0A Traumatic subarachnoid hemorrhage without loss of consciousness, initial encounter: Secondary | ICD-10-CM | POA: Diagnosis not present

## 2020-11-17 DIAGNOSIS — Z823 Family history of stroke: Secondary | ICD-10-CM | POA: Diagnosis not present

## 2020-11-17 DIAGNOSIS — I5032 Chronic diastolic (congestive) heart failure: Secondary | ICD-10-CM | POA: Diagnosis present

## 2020-11-17 DIAGNOSIS — D472 Monoclonal gammopathy: Secondary | ICD-10-CM | POA: Diagnosis present

## 2020-11-17 DIAGNOSIS — U071 COVID-19: Secondary | ICD-10-CM | POA: Diagnosis present

## 2020-11-17 DIAGNOSIS — Z515 Encounter for palliative care: Secondary | ICD-10-CM | POA: Diagnosis not present

## 2020-11-17 LAB — COMPREHENSIVE METABOLIC PANEL
ALT: 12 U/L (ref 0–44)
ALT: 15 U/L (ref 0–44)
AST: 16 U/L (ref 15–41)
AST: 21 U/L (ref 15–41)
Albumin: 3.7 g/dL (ref 3.5–5.0)
Albumin: 3.4 g/dL — ABNORMAL LOW (ref 3.5–5.0)
Alkaline Phosphatase: 41 U/L (ref 38–126)
Alkaline Phosphatase: 40 U/L (ref 38–126)
Anion gap: 12 (ref 5–15)
Anion gap: 14 (ref 5–15)
BUN: 48 mg/dL — ABNORMAL HIGH (ref 8–23)
BUN: 46 mg/dL — ABNORMAL HIGH (ref 8–23)
CO2: 19 mmol/L — ABNORMAL LOW (ref 22–32)
CO2: 18 mmol/L — ABNORMAL LOW (ref 22–32)
Calcium: 9.1 mg/dL (ref 8.9–10.3)
Calcium: 9 mg/dL (ref 8.9–10.3)
Chloride: 108 mmol/L (ref 98–111)
Chloride: 107 mmol/L (ref 98–111)
Creatinine, Ser: 2.91 mg/dL — ABNORMAL HIGH (ref 0.61–1.24)
Creatinine, Ser: 2.78 mg/dL — ABNORMAL HIGH (ref 0.61–1.24)
GFR, Estimated: 20 mL/min — ABNORMAL LOW (ref >60)
GFR, Estimated: 21 mL/min — ABNORMAL LOW (ref >60)
Glucose, Bld: 96 mg/dL (ref 70–99)
Glucose, Bld: 93 mg/dL (ref 70–99)
Potassium: 4.6 mmol/L (ref 3.5–5.1)
Potassium: 4.9 mmol/L (ref 3.5–5.1)
Sodium: 139 mmol/L (ref 135–145)
Sodium: 139 mmol/L (ref 135–145)
Total Bilirubin: 0.5 mg/dL (ref 0.3–1.2)
Total Bilirubin: 0.9 mg/dL (ref 0.3–1.2)
Total Protein: 7 g/dL (ref 6.5–8.1)
Total Protein: 6.6 g/dL (ref 6.5–8.1)

## 2020-11-17 LAB — URINALYSIS, ROUTINE W REFLEX MICROSCOPIC
Bilirubin Urine: NEGATIVE
Glucose, UA: NEGATIVE mg/dL
Ketones, ur: NEGATIVE mg/dL
Nitrite: POSITIVE — AB
Protein, ur: NEGATIVE mg/dL
Specific Gravity, Urine: 1.012 (ref 1.005–1.030)
pH: 5 (ref 5.0–8.0)

## 2020-11-17 LAB — CBC WITH DIFFERENTIAL/PLATELET
Abs Immature Granulocytes: 0 10*3/uL (ref 0.00–0.07)
Abs Immature Granulocytes: 0.02 10*3/uL (ref 0.00–0.07)
Basophils Absolute: 0.1 10*3/uL (ref 0.0–0.1)
Basophils Absolute: 0 10*3/uL (ref 0.0–0.1)
Basophils Relative: 2 %
Basophils Relative: 1 %
Eosinophils Absolute: 0 10*3/uL (ref 0.0–0.5)
Eosinophils Absolute: 0 10*3/uL (ref 0.0–0.5)
Eosinophils Relative: 0 %
Eosinophils Relative: 0 %
HCT: 32.5 % — ABNORMAL LOW (ref 39.0–52.0)
HCT: 31.8 % — ABNORMAL LOW (ref 39.0–52.0)
Hemoglobin: 10.6 g/dL — ABNORMAL LOW (ref 13.0–17.0)
Hemoglobin: 10.4 g/dL — ABNORMAL LOW (ref 13.0–17.0)
Immature Granulocytes: 0 %
Immature Granulocytes: 1 %
Lymphocytes Relative: 28 %
Lymphocytes Relative: 9 %
Lymphs Abs: 0.7 10*3/uL (ref 0.7–4.0)
Lymphs Abs: 0.4 10*3/uL — ABNORMAL LOW (ref 0.7–4.0)
MCH: 31.5 pg (ref 26.0–34.0)
MCH: 31.1 pg (ref 26.0–34.0)
MCHC: 32.6 g/dL (ref 30.0–36.0)
MCHC: 32.7 g/dL (ref 30.0–36.0)
MCV: 96.4 fL (ref 80.0–100.0)
MCV: 95.2 fL (ref 80.0–100.0)
Monocytes Absolute: 0.6 10*3/uL (ref 0.1–1.0)
Monocytes Absolute: 0.5 10*3/uL (ref 0.1–1.0)
Monocytes Relative: 23 %
Monocytes Relative: 10 %
Neutro Abs: 1.2 10*3/uL — ABNORMAL LOW (ref 1.7–7.7)
Neutro Abs: 3.5 10*3/uL (ref 1.7–7.7)
Neutrophils Relative %: 47 %
Neutrophils Relative %: 79 %
Platelets: 73 10*3/uL — ABNORMAL LOW (ref 150–400)
Platelets: 67 10*3/uL — ABNORMAL LOW (ref 150–400)
RBC: 3.37 MIL/uL — ABNORMAL LOW (ref 4.22–5.81)
RBC: 3.34 MIL/uL — ABNORMAL LOW (ref 4.22–5.81)
RDW: 16.3 % — ABNORMAL HIGH (ref 11.5–15.5)
RDW: 16.1 % — ABNORMAL HIGH (ref 11.5–15.5)
WBC: 2.5 10*3/uL — ABNORMAL LOW (ref 4.0–10.5)
WBC: 4.4 10*3/uL (ref 4.0–10.5)
nRBC: 0 % (ref 0.0–0.2)
nRBC: 0 % (ref 0.0–0.2)

## 2020-11-17 LAB — SARS CORONAVIRUS 2 (TAT 6-24 HRS): SARS Coronavirus 2: POSITIVE — AB

## 2020-11-17 LAB — GLUCOSE, CAPILLARY: Glucose-Capillary: 86 mg/dL (ref 70–99)

## 2020-11-17 LAB — CBG MONITORING, ED: Glucose-Capillary: 93 mg/dL (ref 70–99)

## 2020-11-17 LAB — LACTIC ACID, PLASMA: Lactic Acid, Venous: 1.3 mmol/L (ref 0.5–1.9)

## 2020-11-17 LAB — D-DIMER, QUANTITATIVE: D-Dimer, Quant: >20 ug/mL-FEU — ABNORMAL HIGH (ref 0.00–0.50)

## 2020-11-17 MED ORDER — SODIUM CHLORIDE 0.9 % IV SOLN
1.0000 g | INTRAVENOUS | Status: DC
  Administered 2020-11-17 – 2020-11-18 (×2): 1 g via INTRAVENOUS
  Filled 2020-11-17 (×3): qty 10

## 2020-11-17 MED ORDER — SODIUM CHLORIDE 0.9 % IV SOLN: INTRAVENOUS | Status: DC

## 2020-11-17 MED ORDER — ACETAMINOPHEN 325 MG PO TABS: 650.0000 mg | ORAL_TABLET | Freq: Four times a day (QID) | ORAL | Status: DC | PRN

## 2020-11-17 MED ORDER — HYDRALAZINE HCL 20 MG/ML IJ SOLN
10.0000 mg | INTRAMUSCULAR | Status: DC | PRN
  Administered 2020-11-17: 10 mg via INTRAVENOUS
  Filled 2020-11-17: qty 1

## 2020-11-17 MED ORDER — ACETAMINOPHEN 650 MG RE SUPP: 650.0000 mg | Freq: Four times a day (QID) | RECTAL | Status: DC | PRN

## 2020-11-17 MED ORDER — LABETALOL HCL 5 MG/ML IV SOLN
10.0000 mg | INTRAVENOUS | Status: DC | PRN
  Administered 2020-11-19: 10 mg via INTRAVENOUS
  Filled 2020-11-17: qty 4

## 2020-11-17 NOTE — H&P (Signed)
History and Physical    Devin Hogan YBO:[id number] DOB: [birth year] DOA: 11/10/2020  PCP: Biagio Borg, MD  Patient coming from: Home.  History obtained from patient's daughter and patient's wife.  Patient is encephalopathic.  Chief Complaint: Fall.  HPI: Devin Hogan is a 87 y.o. male with history of hypertension, pancytopenia/MGUS, chronic diastolic CHF, chronic kidney disease stage IV was brought to the ER after patient had a fall at home and loss consciousness.  As per the patient's family patient had 2 falls.  Initial fall patient slipped and fell and hit his head.  But did not want to come to the hospital.  The second fall was when patient was trying to adjust the heater when he had an unwitnessed fall following which the family heard a loud sound and went to check on him was on the floor unconscious EMS was called and was brought to the ER.  Per family patient is usually active.  ED Course: In the ER patient appears encephalopathic responds to his name.  CT of the head shows bilateral moderate subarachnoid hemorrhage for which neurosurgery was consulted.  As per neurosurgery patient at this time and is not requiring any surgery and hospitalist admission requested.  Labs are largely at baseline EKG shows normal sinus rhythm with RBBB.  COVID test is pending.  Review of Systems: As per HPI, rest all negative.   Past Medical History:  Diagnosis Date  . Acute gouty arthropathy 12/13/2010  . ALLERGIC RHINITIS 05/22/2007  . BENIGN PROSTATIC HYPERTROPHY 05/22/2007  . COLONIC POLYPS, HX OF 11/19/2007  . ERECTILE DYSFUNCTION 05/22/2007  . Gout 05/17/2011  . Heart failure, type unknown ([place]) 06/30/2020  . HYPERLIPIDEMIA 05/22/2007  . HYPERTENSION 05/22/2007  . RENAL INSUFFICIENCY 11/18/2009  . SKIN LESION 11/16/2010  . Systolic murmur 2/77/8242    Past Surgical History:  Procedure Laterality Date  . none       reports that he has quit smoking. He has never used smokeless tobacco. He  reports previous alcohol use of about 17.0 standard drinks of alcohol per week. He reports that he does not use drugs.  No Known Allergies  Family History  Problem Relation Age of Onset  . Lung cancer Brother   . Stroke Sister   . Pancreatic cancer Neg Hx   . Stomach cancer Neg Hx   . Colon cancer Neg Hx   . Esophageal cancer Neg Hx     Prior to Admission medications   Medication Sig Start Date End Date Taking? Authorizing Provider  allopurinol (ZYLOPRIM) 100 MG tablet Take 0.5 tablets (50 mg total) by mouth daily. 04/07/20   Gregor Hams, MD  AMBULATORY NON FORMULARY MEDICATION Medication Name: prune lax 2-3 x week    [provider]  aspirin 81 MG EC tablet Take 81 mg by mouth daily.    [provider]  colchicine 0.6 MG tablet Take 0.5 tablets (0.3 mg total) by mouth daily as needed (gout flair). 03/31/20   Gregor Hams, MD  Cyanocobalamin (B-12) 1000 MCG TABS Take 1 tablet by mouth daily. 06/17/19   Biagio Borg, MD  diltiazem (CARDIZEM CD) 240 MG 24 hr capsule TAKE 1 CAPSULE BY MOUTH ONCE DAILY . APPOINTMENT REQUIRED FOR FUTURE REFILLS 10/11/20   Biagio Borg, MD  docusate sodium (COLACE) 100 MG capsule Take 100 mg by mouth 2 (two) times a week.    [provider]  furosemide (LASIX) 40 MG tablet Take 40 mg by  mouth daily.    [provider]  HYDROcodone-acetaminophen (NORCO/VICODIN) 5-325 MG tablet Take 1 tablet by mouth every 6 (six) hours as needed for moderate pain. 03/31/20   Gregor Hams, MD  Hypromellose (ARTIFICIAL TEARS OP) Place 1 drop into both eyes daily as needed (dry eyes).    [provider]  lovastatin (MEVACOR) 20 MG tablet Take 1 tablet by mouth once daily 09/22/20   Biagio Borg, MD    Physical Exam: Constitutional: Moderately built and nourished. Vitals:   11/17/20 0230 11/17/20 0300 11/17/20 0330 11/17/20 0400  BP: (!) 155/87 111/69 121/65 107/85  Pulse: 78 79 80 84  Resp: 13 17 16 18   Temp:      TempSrc:       SpO2: 94% 93% 95% 94%   Eyes: Anicteric no pallor. ENMT: No discharge from the ears eyes nose or mouth. Neck: No mass felt.  No neck rigidity. Respiratory: No rhonchi or crepitations. Cardiovascular: S1-S2 heard. Abdomen: Soft nontender bowel sounds present. Musculoskeletal: No edema. Skin: No rash. Neurologic: Patient is encephalopathic responds to his name pupils reacting to light. Psychiatric: Encephalopathic.   Labs on Admission: I have personally reviewed following labs and imaging studies  CBC: Recent Labs  Lab 11/17/20 0009  WBC 2.5*  NEUTROABS 1.2*  HGB 10.6*  HCT 32.5*  MCV 96.4  PLT 73*   Basic Metabolic Panel: Recent Labs  Lab 11/17/20 0009  NA 139  K 4.6  CL 108  CO2 19*  GLUCOSE 96  BUN 48*  CREATININE 2.91*  CALCIUM 9.1   GFR: CrCl cannot be calculated (Unknown ideal weight.). Liver Function Tests: Recent Labs  Lab 11/17/20 0009  AST 16  ALT 12  ALKPHOS 41  BILITOT 0.5  PROT 7.0  ALBUMIN 3.7   No results for input(s): LIPASE, AMYLASE in the last 168 hours. No results for input(s): AMMONIA in the last 168 hours. Coagulation Profile: No results for input(s): INR, PROTIME in the last 168 hours. Cardiac Enzymes: No results for input(s): CKTOTAL, CKMB, CKMBINDEX, TROPONINI in the last 168 hours. BNP (last 3 results) Recent Labs    05/03/20 1250  PROBNP 376.0*   HbA1C: No results for input(s): HGBA1C in the last 72 hours. CBG: No results for input(s): GLUCAP in the last 168 hours. Lipid Profile: No results for input(s): CHOL, HDL, LDLCALC, TRIG, CHOLHDL, LDLDIRECT in the last 72 hours. Thyroid Function Tests: No results for input(s): TSH, T4TOTAL, FREET4, T3FREE, THYROIDAB in the last 72 hours. Anemia Panel: No results for input(s): VITAMINB12, FOLATE, FERRITIN, TIBC, IRON, RETICCTPCT in the last 72 hours. Urine analysis:    Component Value Date/Time   COLORURINE YELLOW 06/22/2020 1136   APPEARANCEUR CLEAR 06/22/2020 1136    LABSPEC 1.013 06/22/2020 1136   PHURINE < OR = 5.0 06/22/2020 1136   GLUCOSEU NEGATIVE 06/22/2020 1136   GLUCOSEU NEGATIVE 06/17/2019 1015   HGBUR 2+ (A) 06/22/2020 1136   BILIRUBINUR NEGATIVE 06/17/2019 1015   KETONESUR NEGATIVE 06/22/2020 1136   PROTEINUR 1+ (A) 06/22/2020 1136   UROBILINOGEN 0.2 06/17/2019 1015   NITRITE POSITIVE (A) 06/22/2020 1136   LEUKOCYTESUR 2+ (A) 06/22/2020 1136   Sepsis Labs: @LABRCNTIP (procalcitonin:4,lacticidven:4) )No results found for this or any previous visit (from the past 240 hour(s)).   Radiological Exams on Admission: CT Head Wo Contrast  Result Date: 11/17/2020 CLINICAL DATA:  Found down unresponsive EXAM: CT HEAD WITHOUT CONTRAST CT CERVICAL SPINE WITHOUT CONTRAST TECHNIQUE: Multidetector CT imaging of the head and cervical spine was  performed following the standard protocol without intravenous contrast. Multiplanar CT image reconstructions of the cervical spine were also generated. COMPARISON:  None. FINDINGS: CT HEAD FINDINGS Brain: There is moderate volume subarachnoid hemorrhage scattered over both hemispheres, in the right sylvian fissure and in the occipital horn of the left lateral ventricle. There is no midline shift or other mass effect. There is periventricular hypoattenuation compatible with chronic microvascular disease. Vascular: Atherosclerotic calcification of the vertebral and internal carotid arteries at the skull base. No abnormal hyperdensity of the major intracranial arteries or dural venous sinuses. Skull: The visualized skull base, calvarium and extracranial soft tissues are normal. Sinuses/Orbits: No fluid levels or advanced mucosal thickening of the visualized paranasal sinuses. No mastoid or middle ear effusion. The orbits are normal. CT CERVICAL SPINE FINDINGS Alignment: No acute subluxation. Grade 1 retrolisthesis at C3-4 and C4-5. Skull base and vertebrae: No acute fracture. Soft tissues and spinal canal: No prevertebral fluid or  swelling. No visible canal hematoma. Disc levels: Multilevel facet hypertrophy. Generalized disc space loss. No high-grade stenosis. Upper chest: No pneumothorax, pulmonary nodule or pleural effusion. Other: Normal visualized paraspinal cervical soft tissues. IMPRESSION: 1. Moderate volume subarachnoid hemorrhage scattered over both hemispheres, in the right Sylvian fissure and in the occipital horn of the left lateral ventricle. No midline shift or other mass effect. 2. No acute fracture or acute subluxation of the cervical spine. Critical Value/emergent results were called by telephone at the time of interpretation on 11/17/2020 at 1:44 am to provider DAVID [facility] , who verbally acknowledged these results. Electronically Signed   By: Ulyses Jarred M.D.   On: 11/17/2020 01:45   CT Cervical Spine Wo Contrast  Result Date: 11/17/2020 CLINICAL DATA:  Found down unresponsive EXAM: CT HEAD WITHOUT CONTRAST CT CERVICAL SPINE WITHOUT CONTRAST TECHNIQUE: Multidetector CT imaging of the head and cervical spine was performed following the standard protocol without intravenous contrast. Multiplanar CT image reconstructions of the cervical spine were also generated. COMPARISON:  None. FINDINGS: CT HEAD FINDINGS Brain: There is moderate volume subarachnoid hemorrhage scattered over both hemispheres, in the right sylvian fissure and in the occipital horn of the left lateral ventricle. There is no midline shift or other mass effect. There is periventricular hypoattenuation compatible with chronic microvascular disease. Vascular: Atherosclerotic calcification of the vertebral and internal carotid arteries at the skull base. No abnormal hyperdensity of the major intracranial arteries or dural venous sinuses. Skull: The visualized skull base, calvarium and extracranial soft tissues are normal. Sinuses/Orbits: No fluid levels or advanced mucosal thickening of the visualized paranasal sinuses. No mastoid or middle ear effusion. The  orbits are normal. CT CERVICAL SPINE FINDINGS Alignment: No acute subluxation. Grade 1 retrolisthesis at C3-4 and C4-5. Skull base and vertebrae: No acute fracture. Soft tissues and spinal canal: No prevertebral fluid or swelling. No visible canal hematoma. Disc levels: Multilevel facet hypertrophy. Generalized disc space loss. No high-grade stenosis. Upper chest: No pneumothorax, pulmonary nodule or pleural effusion. Other: Normal visualized paraspinal cervical soft tissues. IMPRESSION: 1. Moderate volume subarachnoid hemorrhage scattered over both hemispheres, in the right Sylvian fissure and in the occipital horn of the left lateral ventricle. No midline shift or other mass effect. 2. No acute fracture or acute subluxation of the cervical spine. Critical Value/emergent results were called by telephone at the time of interpretation on 11/17/2020 at 1:44 am to provider DAVID [facility] , who verbally acknowledged these results. Electronically Signed   By: Ulyses Jarred M.D.   On: 11/17/2020 01:45  DG Chest Port 1 View  Result Date: 11/17/2020 CLINICAL DATA:  Fall, altered mental status EXAM: PORTABLE CHEST 1 VIEW COMPARISON:  12/11/2017 FINDINGS: No consolidation or convincing features of edema. No pneumothorax or effusion. Some chronically coarsened interstitial and bronchitic changes are similar to prior. Nonspecific elevation the right hemidiaphragm is unchanged from comparison studies. Some diminished lung volumes and atelectatic changes are noted. The aorta is calcified. The remaining cardiomediastinal contours are unchanged from prior counting for differences in technique. No acute osseous or soft tissue abnormality. Degenerative changes are present in the imaged spine and shoulders. Telemetry leads overlie the chest. IMPRESSION: 1. Low volumes and atelectasis without other cardiopulmonary or traumatic findings in the chest. 2. Chronically coarsened interstitial and bronchitic changes with unchanged elevation  of the right hemidiaphragm. Electronically Signed   By: Lovena Le M.D.   On: 11/17/2020 00:17    EKG: Independently reviewed.  Normal sinus rhythm with RBBB.  Assessment/Plan Active Problems:   Essential hypertension   CRI (chronic renal insufficiency), stage 4 (severe) (HCC)   Subarachnoid bleed (HCC)    1. Subarachnoid hemorrhage status post fall for which neurosurgery has been consulted.  We will keep patient n.p.o. for now and follow recommendations per neurosurgery. 2. Acute encephalopathy likely related to the bleed.  Closely monitor respiratory status. 3. Hypertension we will keep patient on as needed IV hydralazine for systolic blood pressure more than 140 as patient is n.p.o. now. 4. Chronic kidney disease stage IV creatinine appears to be at baseline. 5. Chronic pancytopenia/MGUS follow CBC closely. 6. Chronic diastolic CHF presently NPO.  On gentle hydration.  COVID test is pending.  Since patient has subarachnoid hemorrhage with ongoing mental status changes will need close monitoring for any further worsening in inpatient status.   DVT prophylaxis: SCDs.  Patient is having subarachnoid hemorrhage will avoid any anticoagulation or antiplatelet agents. Code Status: DNR confirmed with patient's wife. Family Communication: Patient's wife and patient's daughter. Disposition Plan: To be determined. Consults called: Neurosurgery. Admission status: Inpatient.   Rise Patience MD [facility] Pager [phone].  If 7PM-7AM, please contact night-coverage [email or web address] Password [email or web address]  11/17/2020, 5:00 AM

## 2020-11-17 NOTE — ED Notes (Signed)
2nd attempt to call report. Per RN on floor she will call me back. Also states room is not clean

## 2020-11-17 NOTE — ED Notes (Signed)
Patient transported to CT 

## 2020-11-17 NOTE — Evaluation (Signed)
Clinical/Bedside Swallow Evaluation Patient Details  Name: Devin Hogan MRN: [id number] Date of Birth: 16-May-1928  Today's Date: 11/17/2020 Time: SLP Start Time (ACUTE ONLY): 2831 SLP Stop Time (ACUTE ONLY): 1350 SLP Time Calculation (min) (ACUTE ONLY): 13 min  Past Medical History:  Past Medical History:  Diagnosis Date  . Acute gouty arthropathy 12/13/2010  . ALLERGIC RHINITIS 05/22/2007  . BENIGN PROSTATIC HYPERTROPHY 05/22/2007  . COLONIC POLYPS, HX OF 11/19/2007  . ERECTILE DYSFUNCTION 05/22/2007  . Gout 05/17/2011  . Heart failure, type unknown ([place]) 06/30/2020  . HYPERLIPIDEMIA 05/22/2007  . HYPERTENSION 05/22/2007  . RENAL INSUFFICIENCY 11/18/2009  . SKIN LESION 11/16/2010  . Systolic murmur 03/22/6159   Past Surgical History:  Past Surgical History:  Procedure Laterality Date  . none     HPI:  Pt is a 87 y.o. male with history of hypertension, pancytopenia/MGUS, chronic diastolic CHF, chronic kidney disease stage IV. Pt was brought to the ED after a fall at home and loss consciousness. In the ED pt was encephalopathic. CT of the head showed bilateral moderate subarachnoid hemorrhage. Neurosurgery consulted and surgery was not recommended. CXR 1/13: Low volumes and atelectasis without other cardiopulmonary or traumatic findings in the chest.   Assessment / Plan / Recommendation Clinical Impression  Pt demonstrates relatively good swallowing ability despite obvious debility and confusion. He is able to follow commands, recognizes PO and engages in assisted self feeding. There are no immediate signs of aspiration with consecutive sips of water. Pt would benefit from pureed foods given prolonged mastication and oral residue with solids, though he is able to break apart solids with his gums. Will f/u briefly for tolerance and potential advancement while admitted. SLP Visit Diagnosis: Dysphagia, unspecified (R13.10)    Aspiration Risk       Diet Recommendation Dysphagia 1 (Puree);Thin  liquid   Liquid Administration via: Cup;Straw Medication Administration: Whole meds with liquid Supervision: Staff to assist with self feeding;Full supervision/cueing for compensatory strategies Compensations: Slow rate;Small sips/bites Postural Changes: Seated upright at 90 degrees    Other  Recommendations Oral Care Recommendations: Oral care BID Other Recommendations: Have oral suction available   Follow up Recommendations Skilled Nursing facility      Frequency and Duration min 2x/week  1 week       Prognosis Prognosis for Safe Diet Advancement: Guarded      Swallow Study   General HPI: Pt is a 87 y.o. male with history of hypertension, pancytopenia/MGUS, chronic diastolic CHF, chronic kidney disease stage IV. Pt was brought to the ED after a fall at home and loss consciousness. In the ED pt was encephalopathic. CT of the head showed bilateral moderate subarachnoid hemorrhage. Neurosurgery consulted and surgery was not recommended. CXR 1/13: Low volumes and atelectasis without other cardiopulmonary or traumatic findings in the chest. Type of Study: Bedside Swallow Evaluation Previous Swallow Assessment: none Diet Prior to this Study: NPO Temperature Spikes Noted: No Respiratory Status: Room air History of Recent Intubation: No Behavior/Cognition: Alert;Cooperative;Pleasant mood;Confused Vision: Impaired for self-feeding Self-Feeding Abilities: Total assist Patient Positioning: Postural control interferes with function Baseline Vocal Quality: Low vocal intensity Volitional Cough: Cognitively unable to elicit    Oral/Motor/Sensory Function Overall Oral Motor/Sensory Function: Within functional limits   Ice Chips Ice chips: Not tested   Thin Liquid Thin Liquid: Within functional limits Presentation: Straw    Nectar Thick Nectar Thick Liquid: Not tested   Honey Thick Honey Thick Liquid: Not tested   Puree Puree: Within functional limits Presentation: Spoon  Solid      Solid: Impaired Oral Phase Impairments: Impaired mastication;Reduced lingual movement/coordination Oral Phase Functional Implications: Prolonged oral transit;Oral residue     Herbie [place], MA CCC-SLP  Acute Rehabilitation Services Pager [phone] Office [phone]  Lynann Beaver 11/17/2020,1:58 PM

## 2020-11-17 NOTE — Progress Notes (Signed)
CT shows scattered tSAH bilaterally. No surgical intervention warranted. Recommend hospitalist admit and formal consult by [place] to follow. Repeat head CT in morning

## 2020-11-17 NOTE — ED Notes (Signed)
X-ray at bedside

## 2020-11-17 NOTE — ED Notes (Signed)
pts daughter brought living will and dnr. Copy made and put in pts chart.

## 2020-11-17 NOTE — Consult Note (Signed)
Reason for Consult: sah Referring Physician: edp  Devin Hogan is an 87 y.o. male.   HPI:  87 Year old presented to the ED last night after sustaining 2 falls at home.  He has a very extensive medical history.  Apparently he fell and hit his head with initial fall but refused to go to the hospital.  Family found him down and unconscious after his 2nd fall.  His family called EMS.  During our exam he is a bit confused but awake alert and speech is appropriate.  He is able to move all extremities. He is a poor historian  Past Medical History:  Diagnosis Date  . Acute gouty arthropathy 12/13/2010  . ALLERGIC RHINITIS 05/22/2007  . BENIGN PROSTATIC HYPERTROPHY 05/22/2007  . COLONIC POLYPS, HX OF 11/19/2007  . ERECTILE DYSFUNCTION 05/22/2007  . Gout 05/17/2011  . Heart failure, type unknown ([place]) 06/30/2020  . HYPERLIPIDEMIA 05/22/2007  . HYPERTENSION 05/22/2007  . RENAL INSUFFICIENCY 11/18/2009  . SKIN LESION 11/16/2010  . Systolic murmur 0/06/6760    Past Surgical History:  Procedure Laterality Date  . none      No Known Allergies  Social History   Tobacco Use  . Smoking status: Former Research scientist (life sciences)  . Smokeless tobacco: Never Used  . Tobacco comment: quit when he was very yourg  Substance Use Topics  . Alcohol use: Not Currently    Alcohol/week: 17.0 standard drinks    Types: 1 Glasses of wine, 4 Cans of beer, 12 Shots of liquor per week    Comment: "couple of drinks per day" takes drink on the weekend; no longer drinks alcohol as of around 2019 (per pt 07/20/20)    Family History  Problem Relation Age of Onset  . Lung cancer Brother   . Stroke Sister   . Pancreatic cancer Neg Hx   . Stomach cancer Neg Hx   . Colon cancer Neg Hx   . Esophageal cancer Neg Hx      Review of Systems  Positive ROS:   As above  All other systems have been reviewed and were otherwise negative with the exception of those mentioned in the HPI and as above.  Objective: Vital signs in last 24 hours: Temp:   [98.3 F (36.8 C)] 98.3 F (36.8 C) (01/13 0530) Pulse Rate:  [72-90] 90 (01/13 1045) Resp:  [13-24] 17 (01/13 1045) BP: (107-160)/(65-97) 159/93 (01/13 1045) SpO2:  [90 %-97 %] 90 % (01/13 1045)  General Appearance: Alert, cooperative, no distress, appears stated age Head: Normocephalic, without obvious abnormality, atraumatic Eyes: PERRL, conjunctiva/corneas clear, EOM's intact, fundi benign, both eyes      Lungs:respirations unlabored Heart: Regular rate and rhythm Extremities: Extremities normal, atraumatic, no cyanosis or edema Pulses: 2+ and symmetric all extremities Skin: Skin color, texture, turgor normal, no rashes or lesions  NEUROLOGIC:   Mental status: A and confused, no aphasia,  poor attention span, Memory and fund of knowledge is poor Motor Exam - grossly normal, normal tone and bulk Sensory Exam - grossly normal Reflexes: symmetric, no pathologic reflexes, No Hoffman's, No clonus Coordination -  Not tested Gait -  Not tested Balance -  Not tested Cranial Nerves: I: smell Not tested  II: visual acuity  OS: na  OD: na  II: visual fields Full to confrontation  II: pupils Equal, round, reactive to light  III,VII: ptosis None  III,IV,VI: extraocular muscles  Full ROM  V: mastication Normal  V: facial light touch sensation  Normal  V,VII:  corneal reflex  Present  VII: facial muscle function - upper  Normal  VII: facial muscle function - lower Normal  VIII: hearing Not tested  IX: soft palate elevation  Normal  IX,X: gag reflex Present  XI: trapezius strength  5/5  XI: sternocleidomastoid strength 5/5  XI: neck flexion strength  5/5  XII: tongue strength  Normal    Data Review Lab Results  Component Value Date   WBC 4.4 11/17/2020   HGB 10.4 (L) 11/17/2020   HCT 31.8 (L) 11/17/2020   MCV 95.2 11/17/2020   PLT 67 (L) 11/17/2020   Lab Results  Component Value Date   NA 139 11/17/2020   K 4.9 11/17/2020   CL 107 11/17/2020   CO2 18 (L) 11/17/2020    BUN 46 (H) 11/17/2020   CREATININE 2.78 (H) 11/17/2020   GLUCOSE 93 11/17/2020   No results found for: INR, PROTIME  Radiology: CT Head Wo Contrast  Result Date: 11/17/2020 CLINICAL DATA:  Found down unresponsive EXAM: CT HEAD WITHOUT CONTRAST CT CERVICAL SPINE WITHOUT CONTRAST TECHNIQUE: Multidetector CT imaging of the head and cervical spine was performed following the standard protocol without intravenous contrast. Multiplanar CT image reconstructions of the cervical spine were also generated. COMPARISON:  None. FINDINGS: CT HEAD FINDINGS Brain: There is moderate volume subarachnoid hemorrhage scattered over both hemispheres, in the right sylvian fissure and in the occipital horn of the left lateral ventricle. There is no midline shift or other mass effect. There is periventricular hypoattenuation compatible with chronic microvascular disease. Vascular: Atherosclerotic calcification of the vertebral and internal carotid arteries at the skull base. No abnormal hyperdensity of the major intracranial arteries or dural venous sinuses. Skull: The visualized skull base, calvarium and extracranial soft tissues are normal. Sinuses/Orbits: No fluid levels or advanced mucosal thickening of the visualized paranasal sinuses. No mastoid or middle ear effusion. The orbits are normal. CT CERVICAL SPINE FINDINGS Alignment: No acute subluxation. Grade 1 retrolisthesis at C3-4 and C4-5. Skull base and vertebrae: No acute fracture. Soft tissues and spinal canal: No prevertebral fluid or swelling. No visible canal hematoma. Disc levels: Multilevel facet hypertrophy. Generalized disc space loss. No high-grade stenosis. Upper chest: No pneumothorax, pulmonary nodule or pleural effusion. Other: Normal visualized paraspinal cervical soft tissues. IMPRESSION: 1. Moderate volume subarachnoid hemorrhage scattered over both hemispheres, in the right Sylvian fissure and in the occipital horn of the left lateral ventricle. No midline  shift or other mass effect. 2. No acute fracture or acute subluxation of the cervical spine. Critical Value/emergent results were called by telephone at the time of interpretation on 11/17/2020 at 1:44 am to provider DAVID [facility] , who verbally acknowledged these results. Electronically Signed   By: Ulyses Jarred M.D.   On: 11/17/2020 01:45   CT Cervical Spine Wo Contrast  Result Date: 11/17/2020 CLINICAL DATA:  Found down unresponsive EXAM: CT HEAD WITHOUT CONTRAST CT CERVICAL SPINE WITHOUT CONTRAST TECHNIQUE: Multidetector CT imaging of the head and cervical spine was performed following the standard protocol without intravenous contrast. Multiplanar CT image reconstructions of the cervical spine were also generated. COMPARISON:  None. FINDINGS: CT HEAD FINDINGS Brain: There is moderate volume subarachnoid hemorrhage scattered over both hemispheres, in the right sylvian fissure and in the occipital horn of the left lateral ventricle. There is no midline shift or other mass effect. There is periventricular hypoattenuation compatible with chronic microvascular disease. Vascular: Atherosclerotic calcification of the vertebral and internal carotid arteries at the skull base. No abnormal hyperdensity of the  major intracranial arteries or dural venous sinuses. Skull: The visualized skull base, calvarium and extracranial soft tissues are normal. Sinuses/Orbits: No fluid levels or advanced mucosal thickening of the visualized paranasal sinuses. No mastoid or middle ear effusion. The orbits are normal. CT CERVICAL SPINE FINDINGS Alignment: No acute subluxation. Grade 1 retrolisthesis at C3-4 and C4-5. Skull base and vertebrae: No acute fracture. Soft tissues and spinal canal: No prevertebral fluid or swelling. No visible canal hematoma. Disc levels: Multilevel facet hypertrophy. Generalized disc space loss. No high-grade stenosis. Upper chest: No pneumothorax, pulmonary nodule or pleural effusion. Other: Normal  visualized paraspinal cervical soft tissues. IMPRESSION: 1. Moderate volume subarachnoid hemorrhage scattered over both hemispheres, in the right Sylvian fissure and in the occipital horn of the left lateral ventricle. No midline shift or other mass effect. 2. No acute fracture or acute subluxation of the cervical spine. Critical Value/emergent results were called by telephone at the time of interpretation on 11/17/2020 at 1:44 am to provider DAVID [facility] , who verbally acknowledged these results. Electronically Signed   By: Ulyses Jarred M.D.   On: 11/17/2020 01:45   DG Chest Port 1 View  Result Date: 11/17/2020 CLINICAL DATA:  Fall, altered mental status EXAM: PORTABLE CHEST 1 VIEW COMPARISON:  12/11/2017 FINDINGS: No consolidation or convincing features of edema. No pneumothorax or effusion. Some chronically coarsened interstitial and bronchitic changes are similar to prior. Nonspecific elevation the right hemidiaphragm is unchanged from comparison studies. Some diminished lung volumes and atelectatic changes are noted. The aorta is calcified. The remaining cardiomediastinal contours are unchanged from prior counting for differences in technique. No acute osseous or soft tissue abnormality. Degenerative changes are present in the imaged spine and shoulders. Telemetry leads overlie the chest. IMPRESSION: 1. Low volumes and atelectasis without other cardiopulmonary or traumatic findings in the chest. 2. Chronically coarsened interstitial and bronchitic changes with unchanged elevation of the right hemidiaphragm. Electronically Signed   By: Lovena Le M.D.   On: 11/17/2020 00:17     Assessment/Plan:  87 year old male presented to the ED last night after sustaining to falls.  CT head showed moderate amount of subarachnoid hemorrhage scattered over both hemispheres in the right sylvian fissure into the occipital horn of the left lateral ventricle.  No mass effect or midline shift.  There is no surgical  intervention that is warranted at this time.  Would recommend follow-up head CT   And no blood thinners.   Ocie Cornfield Nathin Saran 11/17/2020 11:22 AM

## 2020-11-17 NOTE — ED Notes (Signed)
Called to give report however rn to call this rn back.

## 2020-11-17 NOTE — Progress Notes (Signed)
87 year old gentleman who was admitted early morning by hospitalist service secondary to fall resulting in bilateral subarachnoid hemorrhage as well as acute encephalopathy.  He was also tested positive for COVID-19.  Patient seen and examined, still in the ED.  He was trying to pull his lines so mittens were placed.  He has mittens in both hands and he is completely disoriented.  Unable to provide any history.  Chest x-ray from yesterday did not show any groundglass opacities.  He is asymptomatic COVID-19 patient.  Does not meet criteria for remdesivir or dexamethasone.  Our system has paused giving antibodies so unfortunately we cannot offer that either.  Neurosurgery saw patient, their formal consultation is still pending.  They were planning to repeat his CT head.His blood pressure is slightly elevated this morning.  He is on IV hydralazine.  Goal is to keep systolic less than 630.  We will add as needed labetalol IV as he remains n.p.o. due to altered mental status.  Upon chart review, it appears that patient also has chronic thrombocytopenia which is a stable.  UA has positive nitrites and leukoesterase and in the face of confusion, this is likely a positive UTI so I will start him on IV Rocephin.  Will order urine culture.  He also seems to have CKD stage IV and his creatinine is at baseline.

## 2020-11-17 NOTE — ED Notes (Signed)
Pt had incontinence episode in CT. Pt's sheets changed and brief placed. Pt offered urinal and reports he does not have to use the restroom at this time. Family at bedside. Will continue to monitor. C-collar still in place.

## 2020-11-17 NOTE — ED Notes (Signed)
Urine culture tube sent down with urine sample.   

## 2020-11-17 NOTE — ED Notes (Signed)
Pt's hematoma to left forehead has grown from minimal rise to about the size of a golf ball. Ice pack applied.

## 2020-11-17 NOTE — Progress Notes (Signed)
Patient arrived to 4NP RM14. Patient VSS no distress is noted at this time. All orders will be reviewed. Will continue to monitor.

## 2020-11-18 DIAGNOSIS — I609 Nontraumatic subarachnoid hemorrhage, unspecified: Secondary | ICD-10-CM | POA: Diagnosis not present

## 2020-11-18 DIAGNOSIS — W19XXXA Unspecified fall, initial encounter: Secondary | ICD-10-CM

## 2020-11-18 DIAGNOSIS — Y92009 Unspecified place in unspecified non-institutional (private) residence as the place of occurrence of the external cause: Secondary | ICD-10-CM

## 2020-11-18 LAB — GLUCOSE, CAPILLARY
Glucose-Capillary: 76 mg/dL (ref 70–99)
Glucose-Capillary: 80 mg/dL (ref 70–99)
Glucose-Capillary: 83 mg/dL (ref 70–99)
Glucose-Capillary: 85 mg/dL (ref 70–99)
Glucose-Capillary: 96 mg/dL (ref 70–99)

## 2020-11-18 NOTE — Progress Notes (Signed)
Patient ID: Devin Hogan, male   DOB: 03-13-1928, 87 y.o.   MRN: [id number] Patient overall doing okay no complaints denies any headache  Awake and alert confused but moves all extremities well  No neurosurgical intervention needed

## 2020-11-18 NOTE — Progress Notes (Signed)
Spoke with patients wife and gave an update. Devin Hogan provided me with a password and that will be added to the chart. All questions answered.

## 2020-11-18 NOTE — Progress Notes (Signed)
Patient is resting in bed eyes closed, pulling at mitts.Patient will open eyes to voice. I have offered food and fluids, patient is not alert enough to eat or drink at this time. MD and family are aware. Patient is resting with short periods of restlessness. Will continue to monitor.

## 2020-11-18 NOTE — Progress Notes (Addendum)
[facility] Progress Note  Patient: Devin Hogan    FWY:[id number]  DOA: 11/19/2020     Date of Service: the patient was seen and examined on 11/18/2020  Brief hospital course: Past medical history of HTN, pancytopenia, HFpEF, CKD 4.  Presents with a fall at home and loss of consciousness.  Found to have subarachnoid hemorrhage. Currently plan is continue to monitor for improvement in mentation.  Assessment and Plan: 1.  Mechanical fall at home per family. Subarachnoid hemorrhage. Neurosurgery was consulted. CT scan of the head shows evidence of subarachnoid hemorrhage moderate size. No midline shift no edema. We will repeat another CT scan on 1/15. Neurosurgery recommends no intervention for now.  2.  Acute encephalopathy on history of dementia Likely due to fall and bleeding. Patient is able to tell me his name and is able to follow the commands. Continue to monitor. Speech therapy recommended dysphagia 1 diet although unable to tolerate anything p.o. prevention we will revisit for speech therapy consultation. Also follow-up on PT OT consultation.  3.  COVID-19 infection No evidence of acute COVID-19 viral illness. Continue to monitor for now.  4.  Essential hypertension Blood pressure stable.  Monitor.  5.  CKD stage IV Renal function at baseline. Placing the patient at high risk for poor outcome given his poor p.o. intake.  6.  Chronic pancytopenia.  History of MGUS. Monitor CBC. At risk for poor outcome in the setting of thrombocytopenia.  7. chronic HFpEF. Currently euvolemic.  Monitor.  Receiving IV fluids.  8.  E. coli UTI. Currently on IV ceftriaxone.    continue for 3 to 5 days.  Diet: Dysphagia 1 diet. DVT Prophylaxis:   SCDs Start: 11/17/20 0455    Advance goals of care discussion: DNR  Family Communication: no family was present at bedside, at the time of interview.  Discussed with family on the phone. The pt provided permission to discuss  medical plan with the family. Opportunity was given to ask question and all questions were answered satisfactorily.   Disposition:  Status is: Inpatient  Remains inpatient appropriate because:IV treatments appropriate due to intensity of illness or inability to take PO and Inpatient level of care appropriate due to severity of illness  Dispo: The patient is from: Home              Anticipated d/c is to: to be determined               Anticipated d/c date is: 2 days              Patient currently is not medically stable to d/c.  Subjective: No nausea no vomiting.  No acute events overnight.  able to follow commands.  Physical Exam:  General: Appear in mild distress, no Rash; Oral Mucosa Clear, moist. no Abnormal Neck Mass Or lumps, Conjunctiva normal  Cardiovascular: S1 and S2 Present, no Murmur, Respiratory: good respiratory effort, Bilateral Air entry present and CTA, no Crackles, no wheezes Abdomen: Bowel Sound present, Soft and no tenderness Extremities: no Pedal edema Neurology: alert and oriented to self. affect appropriate. no new focal deficit Gait not checked due to patient safety concerns  Vitals:   11/18/20 0423 11/18/20 0833 11/18/20 1156 11/18/20 1639  BP: 104/79 133/89 (!) 143/81 130/74  Pulse: 97 88 79 82  Resp: 15 14 13 12   Temp: 98.3 F (36.8 C) 98.8 F (37.1 C) 98.1 F (36.7 C) 97.6 F (36.4 C)  TempSrc: Axillary Oral Oral Oral  SpO2: 95% 94% 94% 93%    Intake/Output Summary (Last 24 hours) at 11/18/2020 1949 Last data filed at 11/18/2020 1830 Gross per 24 hour  Intake 1025 ml  Output 650 ml  Net 375 ml   There were no vitals filed for this visit.  Data Reviewed: I have personally reviewed and interpreted daily labs, tele strips, imaging. I reviewed all nursing notes, pharmacy notes, vitals, pertinent old records I have discussed plan of care as described above with RN and patient/family.  CBC: Recent Labs  Lab 11/17/20 0009 11/17/20 0555  WBC  2.5* 4.4  NEUTROABS 1.2* 3.5  HGB 10.6* 10.4*  HCT 32.5* 31.8*  MCV 96.4 95.2  PLT 73* 67*   Basic Metabolic Panel: Recent Labs  Lab 11/17/20 0009 11/17/20 0555  NA 139 139  K 4.6 4.9  CL 108 107  CO2 19* 18*  GLUCOSE 96 93  BUN 48* 46*  CREATININE 2.91* 2.78*  CALCIUM 9.1 9.0    Studies: No results found.  Scheduled Meds: Continuous Infusions: . sodium chloride 75 mL/hr at 11/17/20 1912  . cefTRIAXone (ROCEPHIN)  IV 1 g (11/18/20 0818)   PRN Meds: acetaminophen **OR** acetaminophen, hydrALAZINE, labetalol  Time spent: 35 minutes  Author: Berle Mull, MD [facility] 11/18/2020 7:49 PM  To reach On-call, see care teams to locate the attending and reach out via [email or web address]. Between 7PM-7AM, please contact night-coverage If you still have difficulty reaching the attending provider, please page the [facility] (Director on Call) for [facility] on amion for assistance.

## 2020-11-18 NOTE — TOC CAGE-AID Note (Signed)
Transition of Care [facility]) - CAGE-AID Screening   Patient Details  Name: AADVIK ROKER MRN: [id number] Date of Birth: 1928-05-09  Iran Planas, RN Phone Number: Trauma Services 11/18/2020, 9:01 AM   CAGE-AID Screening: Substance Abuse Screening unable to be completed due to: : Patient unable to participate (Patient confused. A&O X1.)

## 2020-11-19 ENCOUNTER — Inpatient Hospital Stay (HOSPITAL_COMMUNITY): Payer: Medicare Other

## 2020-11-19 DIAGNOSIS — W19XXXA Unspecified fall, initial encounter: Secondary | ICD-10-CM | POA: Diagnosis not present

## 2020-11-19 DIAGNOSIS — Y92009 Unspecified place in unspecified non-institutional (private) residence as the place of occurrence of the external cause: Secondary | ICD-10-CM | POA: Diagnosis not present

## 2020-11-19 DIAGNOSIS — I609 Nontraumatic subarachnoid hemorrhage, unspecified: Secondary | ICD-10-CM | POA: Diagnosis not present

## 2020-11-19 LAB — GLUCOSE, CAPILLARY
Glucose-Capillary: 108 mg/dL — ABNORMAL HIGH (ref 70–99)
Glucose-Capillary: 75 mg/dL (ref 70–99)
Glucose-Capillary: 88 mg/dL (ref 70–99)

## 2020-11-19 LAB — URINE CULTURE: Culture: >=100000 — AB

## 2020-11-19 MED ORDER — ACETAMINOPHEN 325 MG PO TABS: 650.0000 mg | ORAL_TABLET | Freq: Four times a day (QID) | ORAL | Status: DC | PRN

## 2020-11-19 MED ORDER — LORAZEPAM 1 MG PO TABS: 1.0000 mg | ORAL_TABLET | ORAL | Status: DC | PRN

## 2020-11-19 MED ORDER — LORAZEPAM 2 MG/ML IJ SOLN
1.0000 mg | INTRAMUSCULAR | Status: DC | PRN
  Administered 2020-11-26 – 2020-11-28 (×4): 1 mg via INTRAVENOUS
  Filled 2020-11-19 (×4): qty 1

## 2020-11-19 MED ORDER — LORAZEPAM 2 MG/ML IJ SOLN: 1.0000 mg | INTRAMUSCULAR | Status: DC | PRN

## 2020-11-19 MED ORDER — ACETAMINOPHEN 650 MG RE SUPP: 650.0000 mg | Freq: Four times a day (QID) | RECTAL | Status: DC | PRN

## 2020-11-19 MED ORDER — DEXTROSE 50 % IV SOLN
12.5000 g | INTRAVENOUS | Status: AC
  Administered 2020-11-19: 12.5 g via INTRAVENOUS
  Filled 2020-11-19: qty 50

## 2020-11-19 MED ORDER — HALOPERIDOL LACTATE 5 MG/ML IJ SOLN: 2.0000 mg | INTRAMUSCULAR | Status: DC | PRN

## 2020-11-19 MED ORDER — HALOPERIDOL LACTATE 2 MG/ML PO CONC
2.0000 mg | ORAL | Status: DC | PRN
  Filled 2020-11-19: qty 1

## 2020-11-19 MED ORDER — OXYCODONE HCL 20 MG/ML PO CONC
5.0000 mg | ORAL | Status: DC | PRN
Start: 2020-11-19 — End: 2020-11-30

## 2020-11-19 MED ORDER — ONDANSETRON 4 MG PO TBDP: 4.0000 mg | ORAL_TABLET | Freq: Four times a day (QID) | ORAL | Status: DC | PRN

## 2020-11-19 MED ORDER — GLYCOPYRROLATE 0.2 MG/ML IJ SOLN: 0.2000 mg | INTRAMUSCULAR | Status: DC | PRN

## 2020-11-19 MED ORDER — GLYCOPYRROLATE 1 MG PO TABS: 1.0000 mg | ORAL_TABLET | ORAL | Status: DC | PRN

## 2020-11-19 MED ORDER — HYDROMORPHONE HCL 1 MG/ML IJ SOLN
0.5000 mg | INTRAMUSCULAR | Status: DC | PRN
  Administered 2020-11-21 (×2): 0.5 mg via INTRAVENOUS
  Filled 2020-11-19 (×2): qty 1

## 2020-11-19 MED ORDER — HALOPERIDOL 1 MG PO TABS: 2.0000 mg | ORAL_TABLET | ORAL | Status: DC | PRN

## 2020-11-19 MED ORDER — ONDANSETRON HCL 4 MG/2ML IJ SOLN: 4.0000 mg | Freq: Four times a day (QID) | INTRAMUSCULAR | Status: DC | PRN

## 2020-11-19 MED ORDER — LORAZEPAM 2 MG/ML IJ SOLN
1.0000 mg | Freq: Four times a day (QID) | INTRAMUSCULAR | Status: DC
  Administered 2020-11-19 – 2020-11-30 (×44): 1 mg via INTRAVENOUS
  Filled 2020-11-19 (×44): qty 1

## 2020-11-19 MED ORDER — LORAZEPAM 2 MG/ML PO CONC: 1.0000 mg | ORAL | Status: DC | PRN

## 2020-11-19 MED ORDER — DIPHENHYDRAMINE HCL 50 MG/ML IJ SOLN: 12.5000 mg | INTRAMUSCULAR | Status: DC | PRN

## 2020-11-19 NOTE — Progress Notes (Signed)
[facility] Progress Note  Patient: Devin Hogan    EAV:[id number]  DOA: 12/05/2020     Date of Service: the patient was seen and examined on 11/19/2020  Brief hospital course: Past medical history of HTN, pancytopenia, HFpEF, CKD 4.  Presents with a fall at home and loss of consciousness. Further work-up showed subarachnoid hemorrhage. Under repeat CT scan on 1/15 patient actually has subarachnoid hemorrhage worsening as well as subdural hemorrhage with midline shift and potential seizures. Currently plan is comfort care.  Assessment and Plan: 1.  Mechanical fall at home per family. Worsening subarachnoid hemorrhage. Subdural hemorrhage. 4 mm midline shift Thrombocytopenia. Seizures Neurosurgery was consulted. CT scan of the head shows evidence of subarachnoid hemorrhage moderate size. No midline shift no edema on the first CT scan. On repeat CT scan 4 mm midline shift along with edema with worsening subarachnoid hemorrhage as well as new subdural hematoma. Patient appears to be having possible seizure-like event as well. Discussed with neurosurgery. Currently no indication for surgery and patient may not be a good candidate for surgery regardless. 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 Given improvement bleeding happens, less likely there will be improvement in mentation which would allow patient to take p.o. adequately to maintain hydration and nutrition. Do not think the patient is a good candidate for feeding tube given intracranial bleeding and thrombocytopenia secondary to MGUS At present recommend comfort care. Family agrees. Scheduled Ativan for seizure like tremors along with as needed.  2.  Acute encephalopathy on history of dementia Likely due to fall and bleeding. Patient is able to tell me his name and is able  to follow the commands on 1/14. On 1/15 patient is obtunded and unable to follow any commands open eyes or do any activities. Currently suspect this is in the setting of worsening bleeding as well as seizures. Currently comfort care  3.  COVID-19 infection No evidence of acute COVID-19 viral illness. Continue to monitor for now. Currently comfort care  4.  Essential hypertension Blood pressure stable.  Currently comfort care  5.  CKD stage IV Renal function at baseline. Placing the patient at high risk for poor outcome given his poor p.o. intake. Currently comfort care  6.  Chronic pancytopenia.  History of MGUS. Monitor CBC. At risk for poor outcome in the setting of thrombocytopenia. Currently comfort care.  7. chronic HFpEF. Currently euvolemic. Comfort care  8.  E. coli UTI. Treated with IV ceftriaxone. Currently comfort care  Diet: N.p.o.  Advance goals of care discussion: DNR  Family Communication: no family was present at bedside, at the time of interview.  Discussed with daughter and spouse on the phone. Opportunity was given to ask question and all questions were answered satisfactorily.   Disposition:  Status is: Inpatient  Remains inpatient appropriate because:Inpatient level of care appropriate due to severity of illness   Dispo: The patient is from: Home              Anticipated d/c is to: to be determined               Anticipated d/c date is: 1 day              Patient currently is not medically stable to d/c.  Subjective: Unresponsive.  Not following any commands.  Bilateral upper extremity tremors.  Unable  to tolerate p.o.  Physical Exam:  General: Appear in mild distress, no Rash; Oral Mucosa Clear, dry. no Abnormal Neck Mass Or lumps, Conjunctiva normal  Cardiovascular: S1 and S2 Present, no Murmur, Respiratory: increased respiratory effort, Bilateral Air entry present and CTA, no Crackles, no wheezes Abdomen: Bowel Sound present,  Soft and difficult to assess  tenderness Extremities: no Pedal edema Neurology: obtunded and not oriented to time, place, and person affect unresponsive. Gait not checked due to patient safety concerns  Vitals:   11/19/20 0515 11/19/20 0530 11/19/20 0545 11/19/20 0800  BP: (!) 149/86 (!) 141/95 126/69 123/89  Pulse: 70 72 77 71  Resp: 14 11 18 14   Temp:    99.4 F (37.4 C)  TempSrc:    Axillary  SpO2: 92% 92% 93% 92%    Intake/Output Summary (Last 24 hours) at 11/19/2020 1536 Last data filed at 11/19/2020 0930 Gross per 24 hour  Intake 1025 ml  Output 900 ml  Net 125 ml   There were no vitals filed for this visit.  Data Reviewed: I have personally reviewed and interpreted daily labs, tele strips, imaging. I reviewed all nursing notes, pharmacy notes, vitals, pertinent old records I have discussed plan of care as described above with RN and patient/family.  CBC: Recent Labs  Lab 11/17/20 0009 11/17/20 0555  WBC 2.5* 4.4  NEUTROABS 1.2* 3.5  HGB 10.6* 10.4*  HCT 32.5* 31.8*  MCV 96.4 95.2  PLT 73* 67*   Basic Metabolic Panel: Recent Labs  Lab 11/17/20 0009 11/17/20 0555  NA 139 139  K 4.6 4.9  CL 108 107  CO2 19* 18*  GLUCOSE 96 93  BUN 48* 46*  CREATININE 2.91* 2.78*  CALCIUM 9.1 9.0    Studies: CT HEAD WO CONTRAST  Result Date: 11/19/2020 CLINICAL DATA:  Subarachnoid hemorrhage follow EXAM: CT HEAD WITHOUT CONTRAST TECHNIQUE: Contiguous axial images were obtained from the base of the skull through the vertex without intravenous contrast. COMPARISON:  Head CT 11/17/2020 FINDINGS: Brain: There has been marked worsening of intracranial hemorrhage with increased volume of subarachnoid blood over both convexities but greatest in the right sylvian fissure and posterior left lateral ventricle. There is no herniation. Mild leftward midline shift measures 4 mm. There is also a subdural hematoma over the posterior right convexity that is a new finding. Vascular: There  is calcific atherosclerosis of the skull base arteries. Skull: Normal Sinuses/Orbits: No acute finding. Other: None. IMPRESSION: 1. Marked worsening of intracranial hemorrhage with increased volume of subarachnoid blood over both convexities but greatest in the right Sylvian fissure and posterior left lateral ventricle. 2. New subdural hematoma over the posterior right convexity. 3. 4 mm leftward midline shift. Critical Value/emergent results were called by telephone at the time of interpretation on 11/19/2020 at 2:01 am to provider Lovey Newcomer, NP , who verbally acknowledged these results. Electronically Signed   By: Ulyses Jarred M.D.   On: 11/19/2020 02:01    Scheduled Meds: . LORazepam  1 mg Intravenous QID   Continuous Infusions: . sodium chloride 75 mL/hr at 11/19/20 0047   PRN Meds: acetaminophen **OR** acetaminophen, diphenhydrAMINE, glycopyrrolate **OR** glycopyrrolate **OR** glycopyrrolate, haloperidol **OR** haloperidol **OR** haloperidol lactate, HYDROmorphone (DILAUDID) injection, LORazepam **OR** LORazepam **OR** LORazepam, LORazepam, ondansetron **OR** ondansetron (ZOFRAN) IV, oxyCODONE **OR** oxyCODONE  Time spent: 35 minutes  Author: Berle Mull, MD [facility] 11/19/2020 3:36 PM  To reach On-call, see care teams to locate the attending and reach out via [email or web address]. Between 7PM-7AM, please contact  night-coverage If you still have difficulty reaching the attending provider, please page the [facility] (Director on Call) for [facility] on amion for assistance.

## 2020-11-19 NOTE — Plan of Care (Signed)
  Problem: Education: Goal: Knowledge of risk factors and measures for prevention of condition will improve Outcome: Progressing   Problem: Coping: Goal: Psychosocial and spiritual needs will be supported Outcome: Progressing   

## 2020-11-19 NOTE — Progress Notes (Signed)
Neurosurgery  Notified by primary team of repeat CT head showing worsened SAH as well as small right SDH.  He does not have hydrocephalus and no neurosurgical intervention indicated but given the significant increase in intraventricular blood and right sylvian SAH, patient is at risk for further neurologic decline.  Given his age, comorbidities, and underlying dementia, avoidance of escalation in care in the event of decline is most reasonable.  However,  if family wishes to pursue aggressive measures including possible intubation if he worsens, I would recommend ICU admission for closer neurologic monitoring.  I would also recommend platelet transfusion to keep platelets above 80k, as based on his scan, he likely has a significant degree of platelet dysfunction from his MGUS.  However, I do not foresee any neurosurgical intervention in the future.  I discussed this with Dr. Posey Pronto, hospitalist.

## 2020-11-19 NOTE — Progress Notes (Signed)
Situation: Chaplain responding to referral regarding EOL for pt Devin Hogan.   Background: Facts: RN notified chaplain that family is now present and visiting with pt. Family: Much family (8-10 people) present in lobby of unit, taking turns to visit with [name]. Feelings: Family members expressed "we're doing okay." Faith: Not assessed at this time.  Actions & Assessments: Chaplain offered compassionate presence and hospitality.  Recommendations: For Chaplains: Continue to support pt/family with anticipatory grief and grief when the time comes.  Chaplain remains available for follow-up spiritual/emotional support as needed.  Rev. Susanne Borders, MDiv       11/19/20 1700  Clinical Encounter Type  Visited With Family  Visit Type Initial;Patient actively dying

## 2020-11-20 DIAGNOSIS — I609 Nontraumatic subarachnoid hemorrhage, unspecified: Secondary | ICD-10-CM | POA: Diagnosis not present

## 2020-11-20 DIAGNOSIS — Y92009 Unspecified place in unspecified non-institutional (private) residence as the place of occurrence of the external cause: Secondary | ICD-10-CM | POA: Diagnosis not present

## 2020-11-20 DIAGNOSIS — W19XXXA Unspecified fall, initial encounter: Secondary | ICD-10-CM | POA: Diagnosis not present

## 2020-11-20 NOTE — Progress Notes (Signed)
Neurosurgery  Patient appropriately had goals of care changed to comfort care yesterday.  Please call with questions.

## 2020-11-20 NOTE — Progress Notes (Signed)
[facility] Progress Note  Patient: Devin Hogan    ZJQ:[id number]  DOA: 11/19/2020     Date of Service: the patient was seen and examined on 11/20/2020  Brief hospital course: Past medical history of HTN, pancytopenia, HFpEF, CKD 4.  Presents with a fall at home and loss of consciousness. Further work-up showed subarachnoid hemorrhage. Repeat CT scan on 1/15 patient actually has subarachnoid hemorrhage worsening as well as subdural hemorrhage with midline shift and potential seizures. Currently plan is comfort care.  Assessment and Plan: 1.  Mechanical fall at home per family. Worsening subarachnoid hemorrhage. Subdural hemorrhage. 4 mm midline shift Thrombocytopenia. Seizures Neurosurgery was consulted. CT scan of the head shows evidence of subarachnoid hemorrhage moderate size. No midline shift no edema on the first CT scan. On repeat CT scan 4 mm midline shift along with edema with worsening subarachnoid hemorrhage as well as new subdural hematoma. Patient appears to be having possible seizure-like event as well. Discussed with neurosurgery. Currently no indication for surgery and patient may not be a good candidate for surgery regardless. 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 Even if improvement in bleeding happens, less likely there will be improvement in mentation which would allow patient to take p.o. adequately to maintain hydration and nutrition given his mentation during the time of admission was limited. Do not think the patient is a good candidate for feeding tube. At present recommend comfort care. Family agrees. Has some myoclonic jerks.  As well as agitation. Continue scheduled Ativan for seizure like tremors along with as needed.  2.  Acute encephalopathy on history of dementia Likely due to fall and  bleeding. Patient is able to tell me his name and is able to follow the commands on 1/14. On 1/15 patient is obtunded and unable to follow any commands open eyes or do any activities. Currently suspect this is in the setting of worsening bleeding as well as seizures. Currently comfort care  3.  COVID-19 infection No evidence of acute COVID-19 viral illness. Continue to monitor for now. Currently comfort care  4.  Essential hypertension Blood pressure stable.  Currently comfort care  5.  CKD stage IV Renal function at baseline. Placing the patient at high risk for poor outcome given his poor p.o. intake. Currently comfort care  6.  Chronic pancytopenia.  History of MGUS. Monitor CBC. At risk for poor outcome in the setting of thrombocytopenia. Currently comfort care.  7. chronic HFpEF. Currently euvolemic. Comfort care  8.  E. coli UTI. Treated with IV ceftriaxone. Currently comfort care  Diet: N.p.o.  Advance goals of care discussion: DNR  Family Communication: no family was present at bedside, at the time of interview.   Disposition:  Status is: Inpatient  Remains inpatient appropriate because:Inpatient level of care appropriate due to severity of illness   Dispo: The patient is from: Home              Anticipated d/c is to: to be determined               Anticipated d/c date is: 1 day              Patient currently is not medically stable to d/c.  Subjective: Remains unresponsive.  No acute events overnight. Have some myoclonic jerks.  Tremors resolved.  Physical Exam:  General: Appear in  mild distress, no Rash; Cardiovascular: S1 and S2 Present, no Murmur, Respiratory: increased respiratory effort, Bilateral Air entry present and bilateral  Crackles, no wheezes Abdomen: Bowel Sound present, Extremities: trace Pedal edema Neurology: Obtunded.  Unable to follow commands.  Vitals:   11/19/20 0545 11/19/20 0800 11/19/20 1945 11/20/20 0945  BP:  126/69 123/89 136/69 (!) 141/83  Pulse: 77 71 76 81  Resp: 18 14 17 20   Temp:  99.4 F (37.4 C) 100.1 F (37.8 C) 99.5 F (37.5 C)  TempSrc:  Axillary Axillary Axillary  SpO2: 93% 92% 92% 90%    Intake/Output Summary (Last 24 hours) at 11/20/2020 1349 Last data filed at 11/20/2020 0535 Gross per 24 hour  Intake -  Output 1050 ml  Net -1050 ml   There were no vitals filed for this visit.  Data Reviewed: I have personally reviewed and interpreted daily labs, tele strips, imaging. I reviewed all nursing notes, pharmacy notes, vitals, pertinent old records I have discussed plan of care as described above with RN and patient/family.  CBC: Recent Labs  Lab 11/17/20 0009 11/17/20 0555  WBC 2.5* 4.4  NEUTROABS 1.2* 3.5  HGB 10.6* 10.4*  HCT 32.5* 31.8*  MCV 96.4 95.2  PLT 73* 67*   Basic Metabolic Panel: Recent Labs  Lab 11/17/20 0009 11/17/20 0555  NA 139 139  K 4.6 4.9  CL 108 107  CO2 19* 18*  GLUCOSE 96 93  BUN 48* 46*  CREATININE 2.91* 2.78*  CALCIUM 9.1 9.0    Studies: No results found.  Scheduled Meds: . LORazepam  1 mg Intravenous QID   Continuous Infusions:  PRN Meds: acetaminophen **OR** acetaminophen, diphenhydrAMINE, glycopyrrolate **OR** glycopyrrolate **OR** glycopyrrolate, haloperidol **OR** haloperidol **OR** haloperidol lactate, HYDROmorphone (DILAUDID) injection, LORazepam **OR** LORazepam **OR** LORazepam, LORazepam, ondansetron **OR** ondansetron (ZOFRAN) IV, oxyCODONE **OR** oxyCODONE  Time spent: 35 minutes  Author: Berle Mull, MD [facility] 11/20/2020 1:49 PM  To reach On-call, see care teams to locate the attending and reach out via [email or web address]. Between 7PM-7AM, please contact night-coverage If you still have difficulty reaching the attending provider, please page the [facility] (Director on Call) for [facility] on amion for assistance.

## 2020-11-21 DIAGNOSIS — Y92009 Unspecified place in unspecified non-institutional (private) residence as the place of occurrence of the external cause: Secondary | ICD-10-CM | POA: Diagnosis not present

## 2020-11-21 DIAGNOSIS — I609 Nontraumatic subarachnoid hemorrhage, unspecified: Secondary | ICD-10-CM | POA: Diagnosis not present

## 2020-11-21 DIAGNOSIS — W19XXXA Unspecified fall, initial encounter: Secondary | ICD-10-CM | POA: Diagnosis not present

## 2020-11-21 MED ORDER — HYDROMORPHONE HCL 1 MG/ML IJ SOLN
0.5000 mg | INTRAMUSCULAR | Status: DC | PRN
  Administered 2020-11-21 – 2020-11-22 (×3): 1 mg via INTRAVENOUS
  Administered 2020-11-24: 0.5 mg via INTRAVENOUS
  Administered 2020-11-24 (×2): 1 mg via INTRAVENOUS
  Administered 2020-11-24: 0.5 mg via INTRAVENOUS
  Administered 2020-11-24 – 2020-11-30 (×29): 1 mg via INTRAVENOUS
  Filled 2020-11-21 (×36): qty 1

## 2020-11-21 NOTE — Progress Notes (Signed)
[facility] Progress Note  Patient: Devin Hogan    UKG:[id number]  DOA: 11/24/2020     Date of Service: the patient was seen and examined on 11/21/2020  Brief hospital course: Past medical history of HTN, pancytopenia, HFpEF, CKD 4.  Presents with a fall at home and loss of consciousness. Further work-up showed subarachnoid hemorrhage. Repeat CT scan on 1/15 patient actually has subarachnoid hemorrhage worsening as well as subdural hemorrhage with midline shift and potential seizures. Currently plan is comfort care.  Assessment and Plan: 1.  Mechanical fall at home per family. Worsening subarachnoid hemorrhage. Subdural hemorrhage. 4 mm midline shift Thrombocytopenia. Seizures Neurosurgery was consulted. CT scan of the head shows evidence of subarachnoid hemorrhage moderate size. No midline shift no edema on the first CT scan. On repeat CT scan 4 mm midline shift along with edema with worsening subarachnoid hemorrhage as well as new subdural hematoma. Patient appears to be having possible seizure-like event as well. Discussed with neurosurgery. Currently no indication for surgery and patient may not be a good candidate for surgery regardless. 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 Even if improvement in bleeding happens, less likely there will be improvement in mentation which would allow patient to take p.o. adequately to maintain hydration and nutrition given his mentation during the time of admission was limited. Do not think the patient is a good candidate for feeding tube. At present recommend comfort care. Family agrees. Has some myoclonic jerks.  As well as agitation. Continue scheduled Ativan for seizure like tremors along with as needed.  2.  Acute encephalopathy on history of dementia Likely due to fall and  bleeding. Patient is able to tell me his name and is able to follow the commands on 1/14. On 1/15 patient is obtunded and unable to follow any commands open eyes or do any activities. Currently suspect this is in the setting of worsening bleeding as well as seizures. Currently comfort care  3.  COVID-19 infection No evidence of acute COVID-19 viral illness. Continue to monitor for now. Currently comfort care  4.  Essential hypertension Blood pressure stable.  Currently comfort care  5.  CKD stage IV Renal function at baseline. Placing the patient at high risk for poor outcome given his poor p.o. intake. Currently comfort care  6.  Chronic pancytopenia.  History of MGUS. Monitor CBC. At risk for poor outcome in the setting of thrombocytopenia. Currently comfort care.  7. chronic HFpEF. Currently euvolemic. Comfort care  8.  E. coli UTI. Treated with IV ceftriaxone. Currently comfort care  Diet: N.p.o.  Advance goals of care discussion: DNR  Family Communication: no family was present at bedside, at the time of interview.   Disposition:  Status is: Inpatient  Remains inpatient appropriate because:Inpatient level of care appropriate due to severity of illness   Dispo: The patient is from: Home              Anticipated d/c is to: to be determined, still appears that the patient requires inpatient hospital stay as likely will anticipate in-hospital death.              Anticipated d/c date is: 1 day              Patient currently is not medically stable to d/c.  Subjective: Remains unresponsive.  Agitated.  Appears in  pain.  Also respiratory rate increased.  Physical Exam:  General: Appear in moderate distress, no Rash; Oral Mucosa Clear, moist.  Cardiovascular: S1 and S2 Present, no Murmur, Respiratory: increased respiratory effort, Bilateral Air entry present and bilateral  Crackles, no wheezes Abdomen: Bowel Sound present Extremities: trace Pedal  edema Neurology: Lethargic, unresponsive.  Vitals:   11/20/20 1620 11/20/20 2145 11/21/20 0908 11/21/20 1938  BP: (!) 144/93  130/76 113/86  Pulse: 83  95 68  Resp: 18  19 (!) 21  Temp:  99.1 F (37.3 C) 98.3 F (36.8 C) 98.3 F (36.8 C)  TempSrc:  Axillary Axillary Axillary  SpO2: 91%  92% 92%    Intake/Output Summary (Last 24 hours) at 11/21/2020 2123 Last data filed at 11/21/2020 1800 Gross per 24 hour  Intake -  Output 1100 ml  Net -1100 ml   There were no vitals filed for this visit.  Data Reviewed: I have personally reviewed and interpreted daily labs, tele strips, imaging. I reviewed all nursing notes, pharmacy notes, vitals, pertinent old records I have discussed plan of care as described above with RN and patient/family.  CBC: Recent Labs  Lab 11/17/20 0009 11/17/20 0555  WBC 2.5* 4.4  NEUTROABS 1.2* 3.5  HGB 10.6* 10.4*  HCT 32.5* 31.8*  MCV 96.4 95.2  PLT 73* 67*   Basic Metabolic Panel: Recent Labs  Lab 11/17/20 0009 11/17/20 0555  NA 139 139  K 4.6 4.9  CL 108 107  CO2 19* 18*  GLUCOSE 96 93  BUN 48* 46*  CREATININE 2.91* 2.78*  CALCIUM 9.1 9.0    Studies: No results found.  Scheduled Meds: . LORazepam  1 mg Intravenous QID   Continuous Infusions:  PRN Meds: acetaminophen **OR** acetaminophen, diphenhydrAMINE, glycopyrrolate **OR** glycopyrrolate **OR** glycopyrrolate, haloperidol **OR** haloperidol **OR** haloperidol lactate, HYDROmorphone (DILAUDID) injection, LORazepam **OR** LORazepam **OR** LORazepam, LORazepam, ondansetron **OR** ondansetron (ZOFRAN) IV, oxyCODONE **OR** oxyCODONE  Time spent: 35 minutes  Author: Berle Mull, MD [facility] 11/21/2020 9:23 PM  To reach On-call, see care teams to locate the attending and reach out via [email or web address]. Between 7PM-7AM, please contact night-coverage If you still have difficulty reaching the attending provider, please page the [facility] (Director on Call) for [facility] on  amion for assistance.

## 2020-11-22 DIAGNOSIS — Y92009 Unspecified place in unspecified non-institutional (private) residence as the place of occurrence of the external cause: Secondary | ICD-10-CM | POA: Diagnosis not present

## 2020-11-22 DIAGNOSIS — I609 Nontraumatic subarachnoid hemorrhage, unspecified: Secondary | ICD-10-CM | POA: Diagnosis not present

## 2020-11-22 DIAGNOSIS — W19XXXA Unspecified fall, initial encounter: Secondary | ICD-10-CM | POA: Diagnosis not present

## 2020-11-22 NOTE — Progress Notes (Signed)

## 2020-11-22 NOTE — Progress Notes (Signed)
Patient appears to be resting comfortably during shift.  During bath and linen change, patient did open his eyes and move arms but did not speak.  Mouth care was provided and patient repositioned as well.  Family did call to check on patient via telephone.

## 2020-11-22 NOTE — Progress Notes (Addendum)
[facility]  Patient: Devin Hogan    RSW:[id number]  DOA: 11/19/2020     Date of Service: the patient was seen and examined on 11/22/2020  Brief hospital course: Past medical history of HTN, pancytopenia, HFpEF, CKD 4.  Presents with a fall at home and loss of consciousness. Further work-up showed subarachnoid hemorrhage. Repeat CT scan on 1/15 patient actually has subarachnoid hemorrhage worsening as well as subdural hemorrhage with midline shift and potential seizures. Currently plan is comfort care and transition to residential hospice.  Assessment and Plan: 1.Mechanical fall at home per family. Worsening subarachnoid hemorrhage. Subdural hemorrhage. 4 mm midline shift Thrombocytopenia. Seizures Neurosurgery was consulted. CT scan of the head shows evidence of subarachnoid hemorrhage moderate size. No midline shift no edema on the first CT scan. On repeat CT scan 4 mm midline shift along with edema with worsening subarachnoid hemorrhage as well as new subdural hematoma. Patient appears to be having possible seizure-like event as well. Discussed with neurosurgery. Currently no indication for surgery and patient may not be a good candidate for surgery regardless. 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 Even if improvement in bleeding happens, less likely there will be improvement in mentation which would allow patient to take p.o. adequately to maintain hydration and nutrition given his mentation during the time of admission was limited. Do not think the patient is a good candidate for feeding tube. At present recommend comfort care. Family agrees. Has some myoclonic jerks.  As well as agitation. Continue scheduled Ativan for seizure like tremors along with as needed. TOC Consult for residential hospice  placement.  2.Acute encephalopathy on history of dementia Likely due to fall and bleeding. Patient is able to tell me his name and is able to follow the commands on 1/14. On 1/15 patient is obtunded and unable to follow any commands open eyes or do any activities. Currently suspect this is in the setting of worsening bleeding as well as seizures. Currently comfort care  3.COVID-19 infection No evidence of acute COVID-19 viral illness. Continue to monitor for now. Currently comfort care  4.Essential hypertension Blood pressure stable.  Currently comfort care  5.CKD stage IV Renal function at baseline. Placing the patient at high risk for poor outcome given his poor p.o. intake. Currently comfort care  6.Chronic pancytopenia. History of MGUS. Monitor CBC. At risk for poor outcome in the setting of thrombocytopenia. Currently comfort care.  7.chronic HFpEF. Currently euvolemic. Comfort care  8.E. coli UTI. Treated with IV ceftriaxone. Currently comfort care  Diet: N.p.o. for now  Advance goals of care discussion: DNR  Family Communication: no family was present at bedside, at the time of interview.  Discussed with family on the phone. Opportunity was given to ask question and all questions were answered satisfactorily.   Disposition:  Status is: Inpatient  Remains inpatient appropriate because:Unsafe d/c plan and Inpatient level of care appropriate due to severity of illness   Dispo:  Patient From: Home  Planned Disposition: Residential Hospice  Expected discharge date: 11/23/2020  Medically stable for discharge: Yes  Subjective: No nausea no vomiting.  No acute hematoma.  At the time of my evaluation had some fasciculation of the left arm muscle relaxers.  No tremors noted otherwise.  Physical Exam:  General: Appear in mild distress, no Rash; Oral Mucosa Clear, moist. no Abnormal Neck  Mass Or lumps Cardiovascular: S1 and S2 Present, no  Murmur, Respiratory: increased respiratory effort, Bilateral Air entry present and bilateral  Crackles, no wheezes Abdomen: Bowel Sound present Extremities: no Pedal edema Neurology: lethargic and not oriented to time, place, and person affect unresponsive.  Left arm fasciculation, no new focal deficit Gait not checked due to patient safety concerns  Vitals:   11/20/20 2145 11/21/20 0908 11/21/20 1938 11/22/20 0803  BP:  130/76 113/86 112/69  Pulse:  95 68 92  Resp:  19 (!) 21 12  Temp: 99.1 F (37.3 C) 98.3 F (36.8 C) 98.3 F (36.8 C) 98.9 F (37.2 C)  TempSrc: Axillary Axillary Axillary Axillary  SpO2:  92% 92% 94%    Intake/Output Summary (Last 24 hours) at 11/22/2020 1639 Last data filed at 11/21/2020 1800 Gross per 24 hour  Intake --  Output 150 ml  Net -150 ml   There were no vitals filed for this visit.  Data Reviewed: I have personally reviewed and interpreted daily labs, tele strips, imaging. I reviewed all nursing notes, pharmacy notes, vitals, pertinent old records I have discussed plan of care as described above with RN and patient/family.  CBC: Recent Labs  Lab 11/17/20 0009 11/17/20 0555  WBC 2.5* 4.4  NEUTROABS 1.2* 3.5  HGB 10.6* 10.4*  HCT 32.5* 31.8*  MCV 96.4 95.2  PLT 73* 67*   Basic Metabolic Panel: Recent Labs  Lab 11/17/20 0009 11/17/20 0555  NA 139 139  K 4.6 4.9  CL 108 107  CO2 19* 18*  GLUCOSE 96 93  BUN 48* 46*  CREATININE 2.91* 2.78*  CALCIUM 9.1 9.0    Studies: No results found.  Scheduled Meds: . LORazepam  1 mg Intravenous QID   Continuous Infusions: PRN Meds: acetaminophen **OR** acetaminophen, diphenhydrAMINE, glycopyrrolate **OR** glycopyrrolate **OR** glycopyrrolate, haloperidol **OR** haloperidol **OR** haloperidol lactate, HYDROmorphone (DILAUDID) injection, LORazepam **OR** LORazepam **OR** LORazepam, LORazepam, ondansetron **OR** ondansetron (ZOFRAN) IV, oxyCODONE **OR** oxyCODONE  Time spent: 35  minutes  Author: Berle Mull, MD [facility] 11/22/2020 4:39 PM  To reach On-call, see care teams to locate the attending and reach out via [email or web address]. Between 7PM-7AM, please contact night-coverage If you still have difficulty reaching the attending provider, please page the [facility] (Director on Call) for [facility] on amion for assistance.

## 2020-11-22 NOTE — Progress Notes (Signed)
Patient resting in bed eyes closed. No distress noted. Devin Hogan is at the bedside for a visit. Will continue to monitor.

## 2020-11-22 NOTE — Progress Notes (Signed)
Patient resting in bed. Eyes closed no signs of distress noted at this time. Patient was cleaned and turned. Will continue to monitor.

## 2020-11-22 NOTE — TOC Initial Note (Signed)
Transition of Care (TOC) - Initial/Assessment Note  Marvetta Gibbons RN,BSN Transitions of Care Unit 4NP (non trauma) - RN Case Manager See Treatment Team for direct Phone #   Patient Details  Name: Devin Hogan MRN: [id number] Date of Birth: [birth year]  Transition of Care [facility]) CM/SW Contact:    Dawayne Patricia, RN Phone Number: 11/22/2020, 12:17 PM  Clinical Narrative:                 Referral received for Residential Hospice needs- Pt lives in [place]- call made to [place] for Residential Hospice needs- per [place] there are no beds available today- she will f/u on referral/hospice appropriateness and reach out to family.    Expected Discharge Plan: [place] Barriers to Discharge: Hospice Bed not available   Patient Goals and CMS Choice Patient states their goals for this hospitalization and ongoing recovery are:: comfort care CMS Medicare.gov Compare Post Acute Care list provided to:: Patient Represenative (must comment) Choice offered to / list presented to : Adult Children,Spouse  Expected Discharge Plan and Services Expected Discharge Plan: [place]   Discharge Planning Services: CM Consult Post Acute Care Choice: Hospice Living arrangements for the past 2 months: Single Family Home                           HH Arranged: Disease Management [place] Agency: Hospice and [place] Date [place]: 11/22/20 Time HH Agency Contacted: 1216 Representative spoke with at [place]: Domenic Moras  Prior Living Arrangements/Services Living arrangements for the past 2 months: [place] with:: Spouse Patient language and need for interpreter reviewed:: Yes        Need for Family Participation in Patient Care: Yes (Comment) Care giver support system in place?: Yes (comment) Current home services: DME Criminal Activity/Legal Involvement Pertinent to Current Situation/Hospitalization:  No - Comment as needed  Activities of Daily Living      Permission Sought/Granted                  Emotional Assessment Appearance:: Appears stated age Attitude/Demeanor/Rapport: Unable to Assess Affect (typically observed): Unable to Assess   Alcohol / Substance Use: Not Applicable Psych Involvement: No (comment)  Admission diagnosis:  Acute kidney injury (nontraumatic) (HCC) [N17.9] Subarachnoid bleed (HCC) [I60.9] Pancytopenia ([place]) [D61.818] Subarachnoid hemorrhage following injury, no loss of consciousness, initial encounter ([place]) [W09.8J1B] Fall at home, initial encounter [W19.Merril Abbe, J47.829] Patient Active Problem List   Diagnosis Date Noted  . Subarachnoid bleed ([place]) 11/17/2020  . Chronic diastolic heart failure ([place]) 07/29/2020  . Systolic murmur 56/21/3086  . Elevated brain natriuretic peptide (BNP) level 06/22/2020  . Gait disorder 06/22/2020  . Pain and swelling of left knee 03/24/2020  . Vitamin D deficiency 12/23/2019  . Vitamin B12 deficiency 12/23/2019  . Pancytopenia ([place]) 12/16/2018  . Cough 12/11/2017  . Fall 12/11/2017  . MGUS (monoclonal gammopathy of unknown significance) 06/06/2017  . Dizziness 03/27/2017  . Pyuria 12/07/2016  . Encounter for well adult exam with abnormal findings 12/09/2015  . UTI (lower urinary tract infection) 05/31/2012  . Nocturia 05/31/2012  . Thrombocytopenia ([place]) 12/05/2011  . Gout 05/17/2011  . CRI (chronic renal insufficiency), stage 4 (severe) ([place]) 11/18/2009  . COLONIC POLYPS, HX OF 11/19/2007  . Hyperlipidemia 05/22/2007  . ERECTILE DYSFUNCTION 05/22/2007  . Essential hypertension 05/22/2007  . ALLERGIC RHINITIS 05/22/2007  . BENIGN PROSTATIC HYPERTROPHY 05/22/2007   PCP:  Biagio Borg, MD Pharmacy:   [facility], [place] [address] Phone: [phone] Fax: [phone]     Social Determinants of Health (SDOH)  Interventions    Readmission Risk Interventions No flowsheet data found.

## 2020-11-23 DIAGNOSIS — D61818 Other pancytopenia: Secondary | ICD-10-CM

## 2020-11-23 DIAGNOSIS — S066X0A Traumatic subarachnoid hemorrhage without loss of consciousness, initial encounter: Secondary | ICD-10-CM | POA: Diagnosis not present

## 2020-11-23 NOTE — Progress Notes (Signed)
Manufacturing engineer [facility]) Hospital Liaison Note:  [facility] is unable to offer a bed to this patient today.  An [place] will follow and update on bed availiablity tomorrow or sooner if a room becomes available. Spoke with family to update and support.   Please call with any hospice related questions,  Gar Ponto, RN [place] [facility]LT (on [place]) [phone]

## 2020-11-23 NOTE — Progress Notes (Signed)
Patient is resting in bed eyes closed. Patient opened eyes when turning. No discomfort noted at rest. Will continue to monitor.

## 2020-11-23 NOTE — Progress Notes (Addendum)
[facility] Progress Note   Patient: Devin Hogan    ZCH:[id number]  DOA: 11/29/2020     Date of Service: the patient was seen and examined on 11/23/2020  Brief hospital course: Past medical history of HTN, pancytopenia, HFpEF, CKD 4.  Presents with a fall at home and loss of consciousness. Further work-up showed subarachnoid hemorrhage. Repeat CT scan on 1/15 patient actually has subarachnoid hemorrhage worsening as well as subdural hemorrhage with midline shift and potential seizures. Due to worsening clinical status patient was transitioned to comfort care after discussions with family.   Assessment and Plan:  Subarachnoid hemorrhage  Mechanical fall at home per family. Subdural hemorrhage. 4 mm midline shift Seizures Neurosurgery was consulted. CT scan of the head shows evidence of subarachnoid hemorrhage moderate size. No midline shift no edema on the first CT scan. On repeat CT scan 4 mm midline shift along with edema with worsening subarachnoid hemorrhage as well as new subdural hematoma. Patient was started to have seizure-like activity as well. Patient was not thought to be a good candidate for surgery.  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   Due to worsening clinical status discussions were held with family.  Comfort care was recommended.  Family agreed and patient was transitioned to same.  Currently pursuing residential hospice placement.  Acute encephalopathy on history of dementia Likely due to acute neurological issues discussed above.    COVID-19 infection No evidence of acute COVID-19 viral illness.  Essential hypertension CKD stage IV Chronic pancytopenia.  History of MGUS. Chronic HFpEF.  E. coli UTI. Treated with IV ceftriaxone.  Advance goals of care discussion: DNR/comfort care Family Communication: No family at bedside Disposition: Residential hospice  Status is:  Inpatient  Remains inpatient appropriate because:Unsafe d/c plan and Inpatient level of care appropriate due to severity of illness   Dispo:  Patient From: Home  Planned Disposition: Residential Hospice  Expected discharge date: 11/25/2020  Medically stable for discharge: Yes   Subjective:  Patient responsive only to pain stimuli.  No discomfort noted.    Physical Exam:  General appearance: Minimally responsive Resp: Clear to auscultation bilaterally.   Cardio: S1-S2 is normal regular.  No S3-S4.  No rubs murmurs or bruit GI: Abdomen is soft.  Nontender nondistended.  Bowel sounds are present normal.  No masses organomegaly     Vitals:   11/21/20 1938 11/22/20 0803 11/22/20 2104 11/23/20 0951  BP: 113/86 112/69 134/86 133/86  Pulse: 68 92 94 99  Resp: (!) 21 12 19 17   Temp: 98.3 F (36.8 C) 98.9 F (37.2 C) 98.7 F (37.1 C) 99.8 F (37.7 C)  TempSrc: Axillary Axillary Axillary Axillary  SpO2: 92% 94% 96% 95%    Intake/Output Summary (Last 24 hours) at 11/23/2020 1136 Last data filed at 11/22/2020 1738 Gross per 24 hour  Intake --  Output 275 ml  Net -275 ml   There were no vitals filed for this visit.  Data Reviewed:   CBC: Recent Labs  Lab 11/17/20 0009 11/17/20 0555  WBC 2.5* 4.4  NEUTROABS 1.2* 3.5  HGB 10.6* 10.4*  HCT 32.5* 31.8*  MCV 96.4 95.2  PLT 73* 67*   Basic Metabolic Panel: Recent Labs  Lab 11/17/20 0009 11/17/20 0555  NA 139 139  K 4.6 4.9  CL 108 107  CO2 19* 18*  GLUCOSE 96 93  BUN 48* 46*  CREATININE 2.91* 2.78*  CALCIUM 9.1 9.0    Studies:  No results found.  Scheduled Meds: . LORazepam  1 mg Intravenous QID   Continuous Infusions: PRN Meds: acetaminophen **OR** acetaminophen, diphenhydrAMINE, glycopyrrolate **OR** glycopyrrolate **OR** glycopyrrolate, haloperidol **OR** haloperidol **OR** haloperidol lactate, HYDROmorphone (DILAUDID) injection, LORazepam **OR** LORazepam **OR** LORazepam, LORazepam, ondansetron **OR**  ondansetron (ZOFRAN) IV, oxyCODONE **OR** oxyCODONE   Author: Bonnielee Haff  [facility] 11/23/2020 11:36 AM  To reach On-call, see care teams to locate the attending and reach out via [email or web address]. Between 7PM-7AM, please contact night-coverage If you still have difficulty reaching the attending provider, please page the [facility] (Director on Call) for [facility] on amion for assistance.

## 2020-11-23 NOTE — Consult Note (Signed)
   [facility] Inpatient Consult   11/23/2020  CYPHER PAULE May 30, 1928 [id number]   [place] Organization [ACO] Patient: Devin Hogan [facility]   Patient screened for transition of care for potential community follow up needs.. Brief review of the palliative care consult in patient's medical record reveals patient is for comfort care measures.  Plan: Continue to follow for transition of care and disposition to assess for post hospital needs.  Will sign off when appropriate.  For questions contact:   Natividad Brood, RN BSN [facility] Liaison  [phone] business mobile phone Toll free office [phone]  Fax number: [phone] [email or web address]

## 2020-11-24 DIAGNOSIS — S066X0A Traumatic subarachnoid hemorrhage without loss of consciousness, initial encounter: Secondary | ICD-10-CM | POA: Diagnosis not present

## 2020-11-24 DIAGNOSIS — D61818 Other pancytopenia: Secondary | ICD-10-CM | POA: Diagnosis not present

## 2020-11-24 NOTE — Progress Notes (Signed)
Patient is resting more comfortably at this time.

## 2020-11-24 NOTE — Progress Notes (Signed)
Family at the bedside.

## 2020-11-24 NOTE — Progress Notes (Signed)
Nutrition Brief Note  Chart reviewed. Pt is currently comfort care.  No nutrition interventions warranted at this time.  Please consult as needed.   Corrin Parker, MS, RD, LDN RD pager number/after hours weekend pager number on Amion.

## 2020-11-24 NOTE — Progress Notes (Signed)
Patient has notable increased work of breathing and visible discomfort. Dr. Leverne Humbles encouraged me to give the patient pain meds per orders.

## 2020-11-24 NOTE — Progress Notes (Signed)
Patient RR have slowed and apnea is noted. Family was called and notified of change in patient.

## 2020-11-24 NOTE — Progress Notes (Addendum)
[facility] Progress Note  Patient: Devin Hogan    YTK:[id number]  DOA: 11/22/2020     Date of Service: the patient was seen and examined on 11/24/2020  Brief hospital course: Past medical history of HTN, pancytopenia, HFpEF, CKD 4.  Presents with a fall at home and loss of consciousness. Further work-up showed subarachnoid hemorrhage. Repeat CT scan on 1/15 patient actually has subarachnoid hemorrhage worsening as well as subdural hemorrhage with midline shift and potential seizures. Due to worsening clinical status patient was transitioned to comfort care after discussions with family.   Assessment and Plan:  Subarachnoid hemorrhage  Mechanical fall at home per family. Subdural hemorrhage. 4 mm midline shift Seizures Neurosurgery was consulted. CT scan of the head shows evidence for subarachnoid hemorrhage moderate size. No midline shift no edema on the first CT scan. On repeat CT scan 4 mm midline shift along with edema with worsening subarachnoid hemorrhage as well as new subdural hematoma. Patient was started to have seizure-like activity as well. Patient was not thought to be a good candidate for surgery.  Due to patient's chronic thrombocytopenia, secondary to MGUS, prognosis was thought to be poor.   Due to worsening clinical status discussions were held with family.  Comfort care was recommended.  Family agreed and patient was transitioned to same.  Currently pursuing residential hospice placement.  Patient noted to be more tachycardic and tachypneic this morning.  Discussed with nursing staff.  Hydromorphone to be given.  Acute encephalopathy on history of dementia Likely due to acute neurological issues discussed above.    COVID-19 infection No evidence of acute COVID-19 viral illness.  Essential hypertension CKD stage IV Chronic pancytopenia.  History of MGUS. Chronic HFpEF.  E. coli UTI. Treated with IV ceftriaxone.  Advance goals of care  discussion: DNR/comfort care Family Communication: No family at bedside Disposition: Residential hospice  Status is: Inpatient  Remains inpatient appropriate because:Unsafe d/c plan and Inpatient level of care appropriate due to severity of illness   Dispo:  Patient From: Home  Planned Disposition: Residential Hospice  Expected discharge date: 11/25/2020  Medically stable for discharge: Yes   Subjective:  Unresponsive.  Noted to be grimacing.  Physical Exam:  General appearance: Noted to be grimacing. Resp: Tachypneic with diminished air entry at the bases.  No crackles or wheezing. Cardio: S1-S2 is tachycardic regular.  No S3-S4.  No rubs murmurs or bruit GI: Abdomen is soft.  Nontender nondistended.  Bowel sounds are present normal.  No masses organomegaly Remains unresponsive    Vitals:   11/22/20 2104 11/23/20 0951 11/23/20 1954 11/24/20 1026  BP: 134/86 133/86 129/80 129/77  Pulse: 94 99 100 (!) 101  Resp: 19 17 (!) 22 (!) 28  Temp: 98.7 F (37.1 C) 99.8 F (37.7 C) 99.9 F (37.7 C) 99.9 F (37.7 C)  TempSrc: Axillary Axillary Axillary Axillary  SpO2: 96% 95% 95% 96%    Intake/Output Summary (Last 24 hours) at 11/24/2020 1059 Last data filed at 11/24/2020 0235 Gross per 24 hour  Intake --  Output 725 ml  Net -725 ml   There were no vitals filed for this visit.  Data Reviewed:   Studies: No results found.  Scheduled Meds:  LORazepam  1 mg Intravenous QID   Continuous Infusions: PRN Meds: acetaminophen **OR** acetaminophen, diphenhydrAMINE, glycopyrrolate **OR** glycopyrrolate **OR** glycopyrrolate, haloperidol **OR** haloperidol **OR** haloperidol lactate, HYDROmorphone (DILAUDID) injection, LORazepam **OR** LORazepam **OR** LORazepam, LORazepam, ondansetron **OR** ondansetron (ZOFRAN) IV, oxyCODONE **OR** oxyCODONE   Author: Nicki [place]  [place]  [facility] 11/24/2020 10:59 AM  To reach On-call, see care teams to locate the attending and reach  out via [email or web address]. Between 7PM-7AM, please contact night-coverage If you still have difficulty reaching the attending provider, please page the [facility] (Director on Call) for [facility] on amion for assistance.

## 2020-11-24 NOTE — Progress Notes (Signed)
[facility] ([facility]) Hospital Liaison note.   ° °[facility] is unable to offer a room today. Hospice liaison will update with any change. Please do not hesitate to call with questions.     °A Please do not hesitate to call with questions.     ° °Thank you,     °Mary Anne Robertson, RN, CCM        °[facility] Hospital Liaison (listed on AMION under Hospice /[facility])      °[phone]  °

## 2020-11-25 NOTE — Progress Notes (Signed)
Patient appears to be resting comfortably, pain meds given per PRN orders to help with pain, increased work of breathing and apneic periods. Patient did open eyes while turning but did not speak or move, patient repositioned and mouth care given throughout shift.  Will continue to monitor condition

## 2020-11-25 NOTE — Progress Notes (Signed)
[facility] ([facility]) Hospital Liaison note.   ° °[facility] is unable to offer a room today. Hospice liaison will update with any change. Please do not hesitate to call with questions.     °A Please do not hesitate to call with questions.     ° °Thank you,     °Mary Anne Robertson, RN, CCM        °[facility] Hospital Liaison (listed on AMION under Hospice /[facility])      °[phone]  °

## 2020-11-25 NOTE — Progress Notes (Addendum)
[facility] Progress Note  Patient: Devin Hogan    VFI:[id number]  DOA: 11/20/2020     Date of Service: the patient was seen and examined on 11/25/2020  Brief hospital course: Past medical history of HTN, pancytopenia, HFpEF, CKD 4.  Presents with a fall at home and loss of consciousness. Further work-up showed subarachnoid hemorrhage. Repeat CT scan on 1/15 patient actually has subarachnoid hemorrhage worsening as well as subdural hemorrhage with midline shift and potential seizures. Due to worsening clinical status patient was transitioned to comfort care after discussions with family.   Assessment and Plan:  Subarachnoid hemorrhage  Mechanical fall at home per family. Subdural hemorrhage. 4 mm midline shift Seizures Neurosurgery was consulted. CT scan of the head showed subarachnoid hemorrhage, moderate size. No midline shift no edema on the first CT scan. On repeat CT scan 4 mm midline shift along with edema with worsening subarachnoid hemorrhage as well as new subdural hematoma. Patient was thought to have seizure-like activity as well. Patient was not thought to be a good candidate for surgery.  Due to patient's chronic thrombocytopenia, secondary to MGUS, prognosis was thought to be poor.   Due to worsening clinical status discussions were held with family.  Comfort care was recommended.  Family agreed and patient was transitioned to same.  Currently pursuing residential hospice placement.  Patient appears to have stabilized again.  Seems to be comfortable this morning.  Discussed with nursing staff.  Acute encephalopathy on history of dementia Likely due to acute neurological issues discussed above.    COVID-19 infection No evidence of acute COVID-19 viral illness.  Essential hypertension CKD stage IV Chronic pancytopenia.  History of MGUS. Chronic HFpEF.  E. coli UTI. Treated with IV ceftriaxone.  Advance goals of care discussion: DNR/comfort  care Family Communication: No family at bedside Disposition: Waiting on residential hospice  Status is: Inpatient  Remains inpatient appropriate because:Unsafe d/c plan and Inpatient level of care appropriate due to severity of illness   Dispo:  Patient From: Home  Planned Disposition: Residential Hospice  Expected discharge date: 11/25/2020  Medically stable for discharge: Yes   Subjective:  Remains unresponsive.  Not noted to be grimacing this morning.  Physical Exam:  Noted to be unresponsive.  Seems to be comfortable.  Clear to auscultation bilaterally.  S1-S2 is normal regular.    Vitals:   11/24/20 1026 11/24/20 1353 11/24/20 1933 11/25/20 0849  BP: 129/77  106/73 92/63  Pulse: (!) 101 (!) 101 98 87  Resp: (!) 28 18 17 19   Temp: 99.9 F (37.7 C)  99.5 F (37.5 C) 98.3 F (36.8 C)  TempSrc: Axillary  Axillary Axillary  SpO2: 96% 92% 94% 92%   No intake or output data in the 24 hours ending 11/25/20 1239 There were no vitals filed for this visit.  Data Reviewed:   Studies: No results found.  Scheduled Meds: . LORazepam  1 mg Intravenous QID   Continuous Infusions: PRN Meds: acetaminophen **OR** acetaminophen, diphenhydrAMINE, glycopyrrolate **OR** glycopyrrolate **OR** glycopyrrolate, haloperidol **OR** haloperidol **OR** haloperidol lactate, HYDROmorphone (DILAUDID) injection, LORazepam **OR** LORazepam **OR** LORazepam, LORazepam, ondansetron **OR** ondansetron (ZOFRAN) IV, oxyCODONE **OR** oxyCODONE   Author: Bonnielee Haff  [facility] 11/25/2020 12:39 PM  To reach On-call, see care teams to locate the attending and reach out via [email or web address]. Between 7PM-7AM, please contact night-coverage If you still have difficulty reaching the attending provider, please page the [facility] (Director on Call) for [facility] on amion for assistance.

## 2020-11-26 NOTE — Progress Notes (Signed)
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   [facility] does not have an open bed today for Mr. Stankus.   Hospice liaison will update with any change. Please do not hesitate to call with questions.    Thank you,  Venia Carbon RN, BSN, [facility] Liaison

## 2020-11-26 NOTE — Progress Notes (Signed)
[facility] Progress Note  Patient: Devin Hogan    HYI:[id number]  DOA: 11/22/2020     Date of Service: the patient was seen and examined on 11/26/2020  Brief hospital course: Past medical history of HTN, pancytopenia, HFpEF, CKD 4.  Presents with a fall at home and loss of consciousness. Further work-up showed subarachnoid hemorrhage. Repeat CT scan on 1/15 patient actually has subarachnoid hemorrhage worsening as well as subdural hemorrhage with midline shift and potential seizures. Due to worsening clinical status patient was transitioned to comfort care after discussions with family.   Assessment and Plan:  Subarachnoid hemorrhage  Mechanical fall at home per family. Subdural hemorrhage. 4 mm midline shift Seizures Neurosurgery was consulted. CT scan of the head showed subarachnoid hemorrhage, moderate size. No midline shift no edema on the first CT scan. On repeat CT scan 4 mm midline shift along with edema with worsening subarachnoid hemorrhage as well as new subdural hematoma. Patient was thought to have seizure-like activity as well. Patient was not thought to be a good candidate for surgery.  Due to patient's chronic thrombocytopenia, secondary to MGUS, prognosis was thought to be poor.   Due to worsening clinical status discussions were held with family.  Comfort care was recommended.  Family agreed and patient was transitioned to same.  Currently pursuing residential hospice placement.  Patient does not appear to be in any discomfort this morning.    Acute encephalopathy on history of dementia Likely due to acute neurological issues discussed above.    COVID-19 infection No evidence of acute COVID-19 viral illness.  Essential hypertension CKD stage IV Chronic pancytopenia.  History of MGUS. Chronic HFpEF.  E. coli UTI. Treated with IV ceftriaxone.  Advance goals of care discussion: DNR/comfort care Family Communication: No family at  bedside Disposition: Waiting on residential hospice  Status is: Inpatient  Remains inpatient appropriate because:Unsafe d/c plan and Inpatient level of care appropriate due to severity of illness   Dispo:  Patient From: Home  Planned Disposition: Residential Hospice  Expected discharge date: 11/26/2020  Medically stable for discharge: Yes   Subjective:  Remains unresponsive.  Physical Exam:  Patient unresponsive.  Does not appear to be in any discomfort.  Clear to auscultation bilaterally.  S1-S2 is normal regular.    Vitals:   11/24/20 1933 11/25/20 0849 11/26/20 0124 11/26/20 0849  BP: 106/73 92/63 104/63 (!) 99/59  Pulse: 98 87 86 81  Resp: 17 19 10 10   Temp: 99.5 F (37.5 C) 98.3 F (36.8 C) 98.1 F (36.7 C) 97.9 F (36.6 C)  TempSrc: Axillary Axillary Axillary Axillary  SpO2: 94% 92% 93% 94%    Intake/Output Summary (Last 24 hours) at 11/26/2020 1343 Last data filed at 11/26/2020 7867 Gross per 24 hour  Intake --  Output 100 ml  Net -100 ml   There were no vitals filed for this visit.  Data Reviewed:   Studies: No results found.  Scheduled Meds: . LORazepam  1 mg Intravenous QID   Continuous Infusions: PRN Meds: acetaminophen **OR** acetaminophen, diphenhydrAMINE, glycopyrrolate **OR** glycopyrrolate **OR** glycopyrrolate, haloperidol **OR** haloperidol **OR** haloperidol lactate, HYDROmorphone (DILAUDID) injection, LORazepam **OR** LORazepam **OR** LORazepam, LORazepam, ondansetron **OR** ondansetron (ZOFRAN) IV, oxyCODONE **OR** oxyCODONE   Author: Bonnielee Haff  [facility] 11/26/2020 1:43 PM  To reach On-call, see care teams to locate the attending and reach out via [email or web address]. Between 7PM-7AM, please contact night-coverage If you still have difficulty reaching the attending provider, please page the [facility] (Director on Call) for  [facility] on amion for assistance.

## 2020-11-27 NOTE — Progress Notes (Signed)
[facility] Progress Note  Patient: Devin Hogan    BOF:[id number]  DOA: 11/22/2020     Date of Service: the patient was seen and examined on 11/27/2020  Brief hospital course: Past medical history of HTN, pancytopenia, HFpEF, CKD 4.  Presents with a fall at home and loss of consciousness. Further work-up showed subarachnoid hemorrhage. Repeat CT scan on 1/15 patient actually has subarachnoid hemorrhage worsening as well as subdural hemorrhage with midline shift and potential seizures. Due to worsening clinical status patient was transitioned to comfort care after discussions with family.   Assessment and Plan:  Subarachnoid hemorrhage  Mechanical fall at home per family. Subdural hemorrhage. 4 mm midline shift Seizures Neurosurgery was consulted. CT scan of the head showed subarachnoid hemorrhage, moderate size. No midline shift no edema on the first CT scan. On repeat CT scan 4 mm midline shift along with edema with worsening subarachnoid hemorrhage as well as new subdural hematoma. Patient was thought to have seizure-like activity as well. Patient was not thought to be a good candidate for surgery.  Due to patient's chronic thrombocytopenia, secondary to MGUS, prognosis was thought to be poor.   Due to worsening clinical status discussions were held with family.  Comfort care was recommended.  Family agreed and patient was transitioned to same.  Currently pursuing residential hospice placement.  Patient appears to be fairly comfortable this morning.  Acute encephalopathy on history of dementia Likely due to acute neurological issues discussed above.    COVID-19 infection No evidence of acute COVID-19 viral illness.  Essential hypertension CKD stage IV Chronic pancytopenia.  History of MGUS. Chronic HFpEF.  E. coli UTI. Treated with IV ceftriaxone.  Advance goals of care discussion: DNR/comfort care Family Communication: No family at bedside Disposition:  Waiting on residential hospice  Status is: Inpatient  Remains inpatient appropriate because:Unsafe d/c plan and Inpatient level of care appropriate due to severity of illness   Dispo:  Patient From: Home  Planned Disposition: Residential Hospice  Expected discharge date: 11/27/2020  Medically stable for discharge: No   Subjective:  Remains unresponsive  Physical Exam:  Does not appear to be in any discomfort.   Vitals:   11/26/20 0124 11/26/20 0849 11/26/20 2000 11/27/20 0746  BP: 104/63 (!) 99/59 118/76 105/64  Pulse: 86 81 88 75  Resp: 10 10 10 20   Temp: 98.1 F (36.7 C) 97.9 F (36.6 C) 98.1 F (36.7 C) 97.8 F (36.6 C)  TempSrc: Axillary Axillary Axillary   SpO2: 93% 94% 96% 99%    Intake/Output Summary (Last 24 hours) at 11/27/2020 1126 Last data filed at 11/26/2020 1847 Gross per 24 hour  Intake --  Output 250 ml  Net -250 ml   There were no vitals filed for this visit.  Data Reviewed:   Studies: No results found.  Scheduled Meds: . LORazepam  1 mg Intravenous QID   Continuous Infusions: PRN Meds: acetaminophen **OR** acetaminophen, diphenhydrAMINE, glycopyrrolate **OR** glycopyrrolate **OR** glycopyrrolate, haloperidol **OR** haloperidol **OR** haloperidol lactate, HYDROmorphone (DILAUDID) injection, LORazepam **OR** LORazepam **OR** LORazepam, LORazepam, ondansetron **OR** ondansetron (ZOFRAN) IV, oxyCODONE **OR** oxyCODONE   Author: Bonnielee Haff  [facility] 11/27/2020 11:26 AM  To reach On-call, see care teams to locate the attending and reach out via [email or web address]. Between 7PM-7AM, please contact night-coverage If you still have difficulty reaching the attending provider, please page the [facility] (Director on Call) for [facility] on amion for assistance.

## 2020-11-27 NOTE — Progress Notes (Signed)
[facility] ([facility]) Hospital Liaison note.   ° °[facility] is unable to offer a room today. Hospice liaison will update with any change. Please do not hesitate to call with questions.     °A Please do not hesitate to call with questions.     ° °Thank you,     °Mary Anne Robertson, RN, CCM        °[facility] Hospital Liaison (listed on AMION under Hospice /[facility])      °[phone]  °

## 2020-11-27 NOTE — Progress Notes (Signed)
Patient appears to be resting comfortably during shift. Patient turned and repositioned and mouth care provided. Dilaudid and Ativan given per PRN and scheduled orders, respirations are regular and unlabored but shallow at times.

## 2020-11-28 NOTE — Progress Notes (Signed)
[facility] ([facility]) Hospital Liaison note.   ° °[facility] is unable to offer a room today. Hospice liaison will update with any change. Please do not hesitate to call with questions.     °A Please do not hesitate to call with questions.     ° °Thank you,     °Mary Anne Robertson, RN, CCM        °[facility] Hospital Liaison (listed on AMION under Hospice /[facility])      °[phone]  °

## 2020-11-28 NOTE — Progress Notes (Signed)
Infection prevention called; agreed that pt can be taken off COVID precautions. Pt unresponsive all day, family came to visit late afternoon.

## 2020-11-28 NOTE — Progress Notes (Signed)
[facility] Progress Note  Patient: Devin Hogan    GYJ:[id number]  DOA: 11/05/2020     Date of Service: the patient was seen and examined on 11/28/2020  Brief hospital course: Past medical history of HTN, pancytopenia, HFpEF, CKD 4.  Presents with a fall at home and loss of consciousness. Further work-up showed subarachnoid hemorrhage. Repeat CT scan on 1/15 patient actually has subarachnoid hemorrhage worsening as well as subdural hemorrhage with midline shift and potential seizures. Due to worsening clinical status patient was transitioned to comfort care after discussions with family.   Assessment and Plan:  Subarachnoid hemorrhage  Mechanical fall at home per family. Subdural hemorrhage. 4 mm midline shift Seizures Neurosurgery was consulted. CT scan of the head showed subarachnoid hemorrhage, moderate size. No midline shift no edema on the first CT scan. On repeat CT scan 4 mm midline shift along with edema with worsening subarachnoid hemorrhage as well as new subdural hematoma. Patient was thought to have seizure-like activity as well. Patient was not thought to be a good candidate for surgery.  Due to patient's chronic thrombocytopenia, secondary to MGUS, prognosis was thought to be poor.   Due to worsening clinical status discussions were held with family.  Comfort care was recommended.  Family agreed and patient was transitioned to same.  Currently pursuing residential hospice placement.  Patient fairly comfortable this morning.  Discussed with nursing staff.  Acute encephalopathy on history of dementia Likely due to acute neurological issues discussed above.    COVID-19 infection  Essential hypertension CKD stage IV Chronic pancytopenia.  History of MGUS. Chronic HFpEF.  E. coli UTI. Treated with IV ceftriaxone.  Advance goals of care discussion: DNR/comfort care Family Communication: No family at bedside Disposition: Waiting on residential  hospice  Status is: Inpatient  Remains inpatient appropriate because:Unsafe d/c plan and Inpatient level of care appropriate due to severity of illness   Dispo:  Patient From: Home  Planned Disposition: Residential Hospice  Expected discharge date: 12/02/2020  Medically stable for discharge: No   Subjective:  Remains unresponsive.  Does not appear to be in discomfort.  Physical Exam:  Vital signs reviewed. Lungs are clear to auscultation with diminished air entry at the bases. S1-S2 is normal regular.   Vitals:   11/27/20 0746 11/27/20 1115 11/27/20 2030 11/28/20 0900  BP: 105/64 130/65 (!) 137/94 (!) 118/37  Pulse: 75  87 89  Resp: 20  10 16   Temp: 97.8 F (36.6 C) 98.2 F (36.8 C) (!) 97.2 F (36.2 C) 97.9 F (36.6 C)  TempSrc:  Oral Axillary Oral  SpO2: 99%  99% 98%    Intake/Output Summary (Last 24 hours) at 11/28/2020 1255 Last data filed at 11/28/2020 0654 Gross per 24 hour  Intake --  Output 100 ml  Net -100 ml   There were no vitals filed for this visit.  Data Reviewed:   Studies: No results found.  Scheduled Meds: . LORazepam  1 mg Intravenous QID   Continuous Infusions: PRN Meds: acetaminophen **OR** acetaminophen, diphenhydrAMINE, glycopyrrolate **OR** glycopyrrolate **OR** glycopyrrolate, haloperidol **OR** haloperidol **OR** haloperidol lactate, HYDROmorphone (DILAUDID) injection, LORazepam **OR** LORazepam **OR** LORazepam, LORazepam, ondansetron **OR** ondansetron (ZOFRAN) IV, oxyCODONE **OR** oxyCODONE   Author: Bonnielee Haff  [facility] 11/28/2020 12:55 PM  To reach On-call, see care teams to locate the attending and reach out via [email or web address]. Between 7PM-7AM, please contact night-coverage If you still have difficulty reaching the attending provider, please page the [facility] (Director on Call) for [facility] on  amion for assistance.  

## 2020-11-28 NOTE — Progress Notes (Signed)
Patient appears to be resting comfortably during shift. Occasional apneic periods noted during shift and breaths are shallow at times. Patient turned and repositioned and dilaudid and ativan given per PRN and scheduled orders.

## 2020-11-29 NOTE — Progress Notes (Signed)
[facility] Progress Note  Patient: Devin Hogan    ZOX:[id number]  DOA: 11/17/2020     Date of Service: the patient was seen and examined on 11/29/2020  Brief hospital course: Past medical history of HTN, pancytopenia, HFpEF, CKD 4.  Presents with a fall at home and loss of consciousness. Further work-up showed subarachnoid hemorrhage. Repeat CT scan on 1/15 patient actually has subarachnoid hemorrhage worsening as well as subdural hemorrhage with midline shift and potential seizures. Due to worsening clinical status patient was transitioned to comfort care after discussions with family.   Assessment and Plan:  Subarachnoid hemorrhage  Mechanical fall at home per family. Subdural hemorrhage. 4 mm midline shift Seizures Neurosurgery was consulted. CT scan of the head showed subarachnoid hemorrhage, moderate size. No midline shift no edema on the first CT scan. On repeat CT scan 4 mm midline shift along with edema with worsening subarachnoid hemorrhage as well as new subdural hematoma. Patient was thought to have seizure-like activity as well. Patient was not thought to be a good candidate for surgery.  Due to patient's chronic thrombocytopenia, secondary to MGUS, prognosis was thought to be poor.   Due to worsening clinical status discussions were held with family.  Comfort care was recommended.  Family agreed and patient was transitioned to same.  Currently pursuing residential hospice placement.  Patient noted to be fairly comfortable this morning.  Waiting on bed at residential hospice  Acute encephalopathy on history of dementia Likely due to acute neurological issues discussed above.    COVID-19 infection  Essential hypertension CKD stage IV Chronic pancytopenia.  History of MGUS. Chronic HFpEF.  E. coli UTI. Treated with IV ceftriaxone.  Advance goals of care discussion: DNR/comfort care Family Communication: No family at bedside Disposition: Waiting on  residential hospice  Status is: Inpatient  Remains inpatient appropriate because:Unsafe d/c plan and Inpatient level of care appropriate due to severity of illness   Dispo:  Patient From: Home  Planned Disposition: Residential Hospice  Expected discharge date: 12/02/2020  Medically stable for discharge: No   Subjective:  Remains unresponsive.  Does not appear to be in any discomfort.  Physical Exam:  Appears to be comfortable. Lungs are clear to auscultation.  Shallow respirations noted S1-S2 is normal regular.   Vitals:   11/27/20 1115 11/27/20 2030 11/28/20 0900 11/28/20 2018  BP: 130/65 (!) 137/94 (!) 118/37 120/68  Pulse:  87 89 73  Resp:  10 16 16   Temp: 98.2 F (36.8 C) (!) 97.2 F (36.2 C) 97.9 F (36.6 C) 97.9 F (36.6 C)  TempSrc: Oral Axillary Oral Oral  SpO2:  99% 98% 96%   No intake or output data in the 24 hours ending 11/29/20 1016 There were no vitals filed for this visit.  Data Reviewed:   Studies: No results found.  Scheduled Meds: . LORazepam  1 mg Intravenous QID   Continuous Infusions: PRN Meds: acetaminophen **OR** acetaminophen, diphenhydrAMINE, glycopyrrolate **OR** glycopyrrolate **OR** glycopyrrolate, haloperidol **OR** haloperidol **OR** haloperidol lactate, HYDROmorphone (DILAUDID) injection, LORazepam **OR** LORazepam **OR** LORazepam, LORazepam, ondansetron **OR** ondansetron (ZOFRAN) IV, oxyCODONE **OR** oxyCODONE   Author: Bonnielee Haff  [facility] 11/29/2020 10:16 AM  To reach On-call, see care teams to locate the attending and reach out via [email or web address]. Between 7PM-7AM, please contact night-coverage If you still have difficulty reaching the attending provider, please page the [facility] (Director on Call) for [facility] on amion for assistance.

## 2020-11-29 NOTE — Progress Notes (Signed)
[facility] ([facility]) Hospital Liaison note.    [facility] is unable to offer a room today. Hospital Liaison will follow up tomorrow or sooner if a room becomes available. Please do not hesitate to call with questions.    Thank you for the opportunity to participate in this patient's care.  Chrislyn King, BSN, RN [facility] Hospital Liaison (listed on AMION under Hospice/[facility])    [phone] (24h on call)     

## 2020-11-30 DIAGNOSIS — W19XXXA Unspecified fall, initial encounter: Secondary | ICD-10-CM | POA: Diagnosis not present

## 2020-11-30 DIAGNOSIS — N179 Acute kidney failure, unspecified: Secondary | ICD-10-CM

## 2020-11-30 DIAGNOSIS — S066X0A Traumatic subarachnoid hemorrhage without loss of consciousness, initial encounter: Secondary | ICD-10-CM | POA: Diagnosis not present

## 2020-11-30 DIAGNOSIS — Z515 Encounter for palliative care: Secondary | ICD-10-CM | POA: Diagnosis not present

## 2020-11-30 DIAGNOSIS — I10 Essential (primary) hypertension: Secondary | ICD-10-CM

## 2020-12-06 NOTE — Death Summary Note (Signed)
Death Summary  LAVAUGHN HABERLE XYV:[id number] DOB: 07/01/1928 DOA: 12-Dec-2020  PCP: Biagio Borg, MD  Admit date: Dec 12, 2020 Date of Death: December 26, 2020 Time of Death: 1040AM Notification: Biagio Borg, MD notified of death of 2020-12-26   History of present illness:  87 year old with history of HTN, pancytopenia, CKD stage IV, CHF with preserved ejection fraction admitted for fall and loss of consciousness found to have subarachnoid hemorrhage which was persistent and worsening on repeat CAT scan.  Patient was transitioned to comfort care. Family notified by patient's RN.  Patient's RN will also be calling medical examiner. Patient passed away on 26-Dec-2020 at 10:40 AM.  Code Status= DNR/DNI comfort care  On physical exam: Patient was unresponsive to verbal and physical stimuli.  No gag reflex, pupillary reflex or corneal reflex was present.  Heart sounds and breath sounds were absent.  Patient's RN was present with me in the room  Final/Principal Diagnoses:  #1 subarachnoid hemorrhage 2.  Mechanical fall at home 3.  Subdural hemorrhage 4.  COVID-19 infection 5.  Congestive heart failure with preserved ejection fraction  Disposition/Follow up Care: Patient is deceased.   Discharge medications: None  The results of significant diagnostics from this hospitalization (including imaging, microbiology, ancillary and laboratory) are listed below for reference.    Significant Diagnostic Studies: CT HEAD WO CONTRAST  Result Date: 11/19/2020 CLINICAL DATA:  Subarachnoid hemorrhage follow EXAM: CT HEAD WITHOUT CONTRAST TECHNIQUE: Contiguous axial images were obtained from the base of the skull through the vertex without intravenous contrast. COMPARISON:  Head CT 11/17/2020 FINDINGS: Brain: There has been marked worsening of intracranial hemorrhage with increased volume of subarachnoid blood over both convexities but greatest in the right sylvian fissure and posterior left lateral ventricle.  There is no herniation. Mild leftward midline shift measures 4 mm. There is also a subdural hematoma over the posterior right convexity that is a new finding. Vascular: There is calcific atherosclerosis of the skull base arteries. Skull: Normal Sinuses/Orbits: No acute finding. Other: None. IMPRESSION: 1. Marked worsening of intracranial hemorrhage with increased volume of subarachnoid blood over both convexities but greatest in the right Sylvian fissure and posterior left lateral ventricle. 2. New subdural hematoma over the posterior right convexity. 3. 4 mm leftward midline shift. Critical Value/emergent results were called by telephone at the time of interpretation on 11/19/2020 at 2:01 am to provider Lovey Newcomer, NP , who verbally acknowledged these results. Electronically Signed   By: Ulyses Jarred M.D.   On: 11/19/2020 02:01   CT Head Wo Contrast  Result Date: 11/17/2020 CLINICAL DATA:  Found down unresponsive EXAM: CT HEAD WITHOUT CONTRAST CT CERVICAL SPINE WITHOUT CONTRAST TECHNIQUE: Multidetector CT imaging of the head and cervical spine was performed following the standard protocol without intravenous contrast. Multiplanar CT image reconstructions of the cervical spine were also generated. COMPARISON:  None. FINDINGS: CT HEAD FINDINGS Brain: There is moderate volume subarachnoid hemorrhage scattered over both hemispheres, in the right sylvian fissure and in the occipital horn of the left lateral ventricle. There is no midline shift or other mass effect. There is periventricular hypoattenuation compatible with chronic microvascular disease. Vascular: Atherosclerotic calcification of the vertebral and internal carotid arteries at the skull base. No abnormal hyperdensity of the major intracranial arteries or dural venous sinuses. Skull: The visualized skull base, calvarium and extracranial soft tissues are normal. Sinuses/Orbits: No fluid levels or advanced mucosal thickening of the visualized paranasal  sinuses. No mastoid or middle ear effusion. The orbits are normal. CT CERVICAL  SPINE FINDINGS Alignment: No acute subluxation. Grade 1 retrolisthesis at C3-4 and C4-5. Skull base and vertebrae: No acute fracture. Soft tissues and spinal canal: No prevertebral fluid or swelling. No visible canal hematoma. Disc levels: Multilevel facet hypertrophy. Generalized disc space loss. No high-grade stenosis. Upper chest: No pneumothorax, pulmonary nodule or pleural effusion. Other: Normal visualized paraspinal cervical soft tissues. IMPRESSION: 1. Moderate volume subarachnoid hemorrhage scattered over both hemispheres, in the right Sylvian fissure and in the occipital horn of the left lateral ventricle. No midline shift or other mass effect. 2. No acute fracture or acute subluxation of the cervical spine. Critical Value/emergent results were called by telephone at the time of interpretation on 11/17/2020 at 1:44 am to provider DAVID [facility] , who verbally acknowledged these results. Electronically Signed   By: Ulyses Jarred M.D.   On: 11/17/2020 01:45   CT Cervical Spine Wo Contrast  Result Date: 11/17/2020 CLINICAL DATA:  Found down unresponsive EXAM: CT HEAD WITHOUT CONTRAST CT CERVICAL SPINE WITHOUT CONTRAST TECHNIQUE: Multidetector CT imaging of the head and cervical spine was performed following the standard protocol without intravenous contrast. Multiplanar CT image reconstructions of the cervical spine were also generated. COMPARISON:  None. FINDINGS: CT HEAD FINDINGS Brain: There is moderate volume subarachnoid hemorrhage scattered over both hemispheres, in the right sylvian fissure and in the occipital horn of the left lateral ventricle. There is no midline shift or other mass effect. There is periventricular hypoattenuation compatible with chronic microvascular disease. Vascular: Atherosclerotic calcification of the vertebral and internal carotid arteries at the skull base. No abnormal hyperdensity of the major  intracranial arteries or dural venous sinuses. Skull: The visualized skull base, calvarium and extracranial soft tissues are normal. Sinuses/Orbits: No fluid levels or advanced mucosal thickening of the visualized paranasal sinuses. No mastoid or middle ear effusion. The orbits are normal. CT CERVICAL SPINE FINDINGS Alignment: No acute subluxation. Grade 1 retrolisthesis at C3-4 and C4-5. Skull base and vertebrae: No acute fracture. Soft tissues and spinal canal: No prevertebral fluid or swelling. No visible canal hematoma. Disc levels: Multilevel facet hypertrophy. Generalized disc space loss. No high-grade stenosis. Upper chest: No pneumothorax, pulmonary nodule or pleural effusion. Other: Normal visualized paraspinal cervical soft tissues. IMPRESSION: 1. Moderate volume subarachnoid hemorrhage scattered over both hemispheres, in the right Sylvian fissure and in the occipital horn of the left lateral ventricle. No midline shift or other mass effect. 2. No acute fracture or acute subluxation of the cervical spine. Critical Value/emergent results were called by telephone at the time of interpretation on 11/17/2020 at 1:44 am to provider DAVID [facility] , who verbally acknowledged these results. Electronically Signed   By: Ulyses Jarred M.D.   On: 11/17/2020 01:45   DG Chest Port 1 View  Result Date: 11/17/2020 CLINICAL DATA:  Fall, altered mental status EXAM: PORTABLE CHEST 1 VIEW COMPARISON:  12/11/2017 FINDINGS: No consolidation or convincing features of edema. No pneumothorax or effusion. Some chronically coarsened interstitial and bronchitic changes are similar to prior. Nonspecific elevation the right hemidiaphragm is unchanged from comparison studies. Some diminished lung volumes and atelectatic changes are noted. The aorta is calcified. The remaining cardiomediastinal contours are unchanged from prior counting for differences in technique. No acute osseous or soft tissue abnormality. Degenerative changes are  present in the imaged spine and shoulders. Telemetry leads overlie the chest. IMPRESSION: 1. Low volumes and atelectasis without other cardiopulmonary or traumatic findings in the chest. 2. Chronically coarsened interstitial and bronchitic changes with unchanged elevation of the right  hemidiaphragm. Electronically Signed   By: Lovena Le M.D.   On: 11/17/2020 00:17    Microbiology: No results found for this or any previous visit (from the past 240 hour(s)).   Labs: Basic Metabolic Panel: No results for input(s): NA, K, CL, CO2, GLUCOSE, BUN, CREATININE, CALCIUM, MG, PHOS in the last 168 hours. Liver Function Tests: No results for input(s): AST, ALT, ALKPHOS, BILITOT, PROT, ALBUMIN in the last 168 hours. No results for input(s): LIPASE, AMYLASE in the last 168 hours. No results for input(s): AMMONIA in the last 168 hours. CBC: No results for input(s): WBC, NEUTROABS, HGB, HCT, MCV, PLT in the last 168 hours. Cardiac Enzymes: No results for input(s): CKTOTAL, CKMB, CKMBINDEX, TROPONINI in the last 168 hours. D-Dimer No results for input(s): DDIMER in the last 72 hours. BNP: Invalid input(s): POCBNP CBG: No results for input(s): GLUCAP in the last 168 hours. Anemia work up No results for input(s): VITAMINB12, FOLATE, FERRITIN, TIBC, IRON, RETICCTPCT in the last 72 hours. Urinalysis    Component Value Date/Time   COLORURINE YELLOW 11/17/2020 0555   APPEARANCEUR HAZY (A) 11/17/2020 0555   LABSPEC 1.012 11/17/2020 0555   PHURINE 5.0 11/17/2020 0555   GLUCOSEU NEGATIVE 11/17/2020 0555   GLUCOSEU NEGATIVE 06/17/2019 1015   HGBUR MODERATE (A) 11/17/2020 0555   BILIRUBINUR NEGATIVE 11/17/2020 0555   KETONESUR NEGATIVE 11/17/2020 0555   PROTEINUR NEGATIVE 11/17/2020 0555   UROBILINOGEN 0.2 06/17/2019 1015   NITRITE POSITIVE (A) 11/17/2020 0555   LEUKOCYTESUR TRACE (A) 11/17/2020 0555   Sepsis Labs Invalid input(s): PROCALCITONIN,  WBC,  LACTICIDVEN  I have spent 18minutes face to  face encounter with the patient and on the ward discussing the patients care, assessment, plan and disposition with other care givers. >50% of the time was devoted  coordinating care.    SIGNED:  Damita Lack, MD  [facility] Dec 04, 2020, 11:47 AM Pager   If 7PM-7AM, please contact night-coverage [email or web address] Password [email or web address]

## 2020-12-06 NOTE — Progress Notes (Signed)
[facility] ([facility]) Hospital Liaison note.    [facility] is unable to offer a room today. Hospital Liaison will follow up tomorrow or sooner if a room becomes available. Please do not hesitate to call with questions.    Thank you for the opportunity to participate in this patient's care.  Chrislyn King, BSN, RN [facility] Hospital Liaison (listed on AMION under Hospice/[facility])    [phone] (24h on call)     

## 2020-12-06 DEATH — deceased

## 2020-12-27 ENCOUNTER — Ambulatory Visit: Payer: Medicare Other | Admitting: Internal Medicine

## 2021-02-10 IMAGING — CT CT HEAD W/O CM
3 of 5 series · 15 of 47 positions shown, 18 images · non-contrast
Comparison: Head CT 11/17/2020

CLINICAL DATA: Subarachnoid hemorrhage follow

EXAM:
CT HEAD WITHOUT CONTRAST
TECHNIQUE: Contiguous axial images were obtained from the base of the skull
through the vertex without intravenous contrast.

[Series 4: head 2.0 h70h · axial · 0.44mm/px · z∈[-163,-15]mm · 9 of 88 slices shown, 12 images]
[im 7/88  brain]
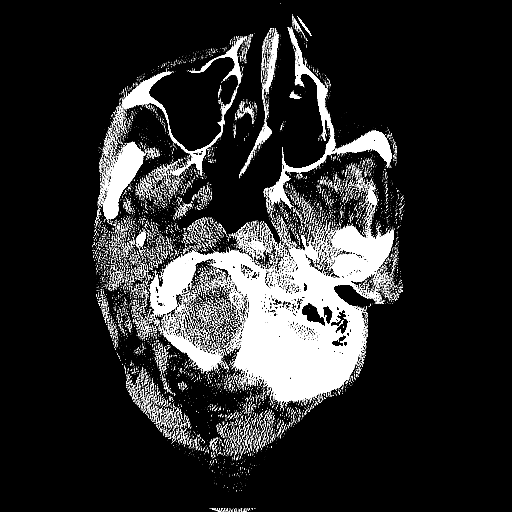
[im 7/88  bone]
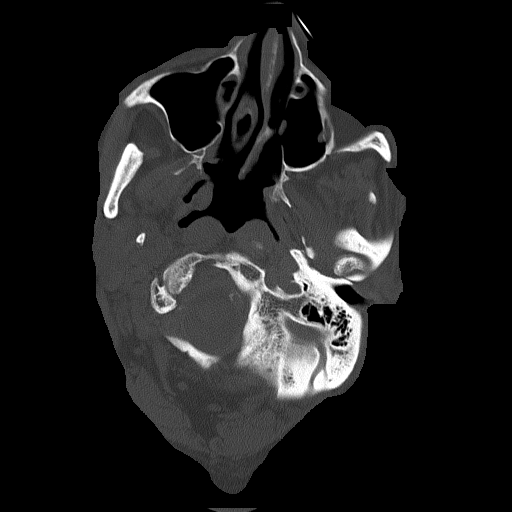
[im 21/88  brain]
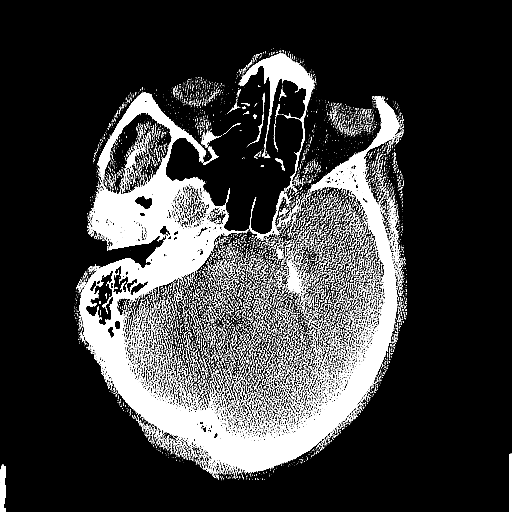
[im 27/88  brain]
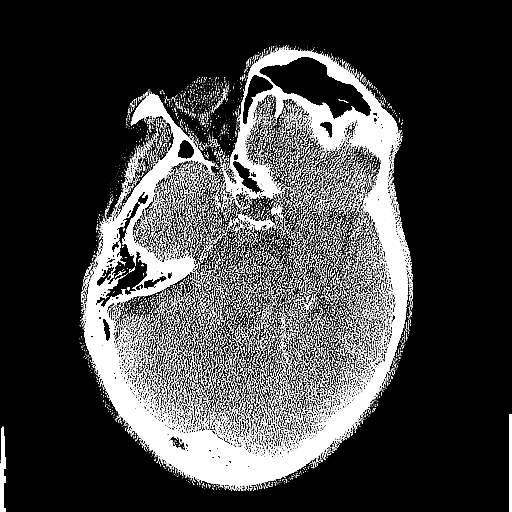
[im 34/88  brain]
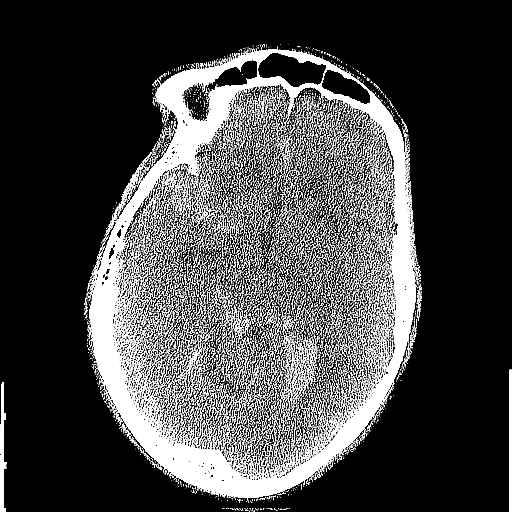
[im 47/88  brain]
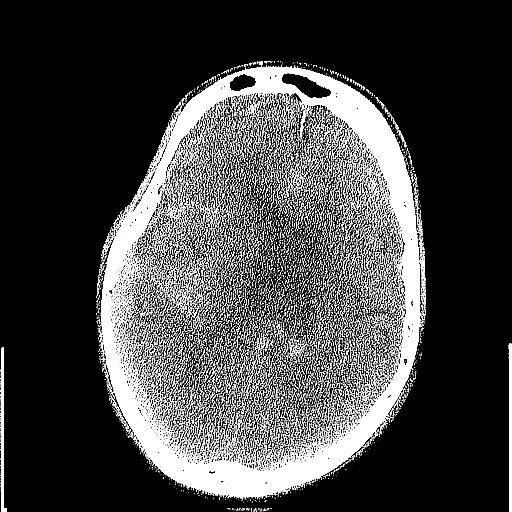
[im 47/88  bone]
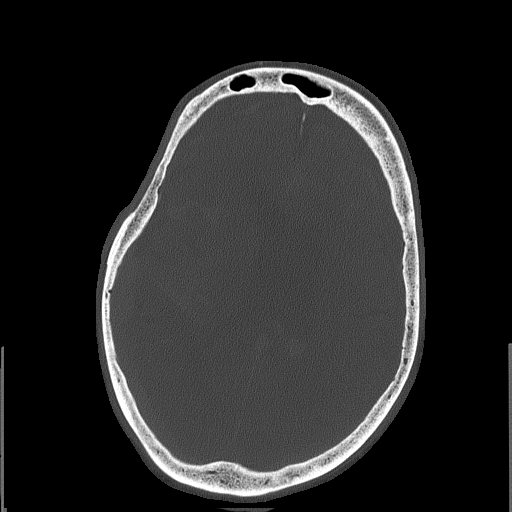
[im 54/88  brain]
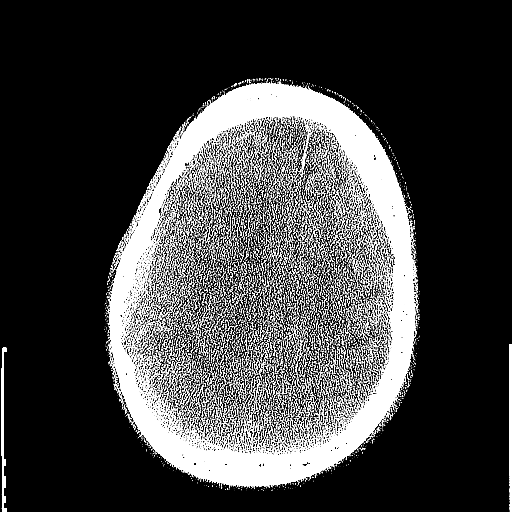
[im 61/88  brain]
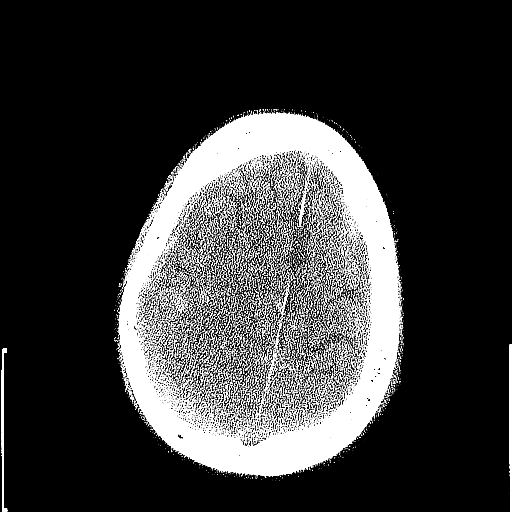
[im 74/88  brain]
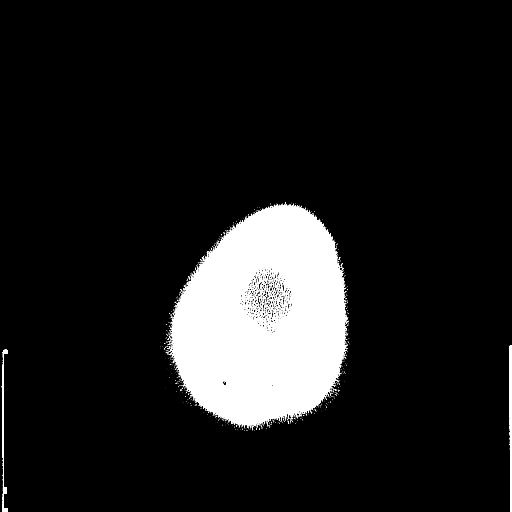
[im 81/88  brain]
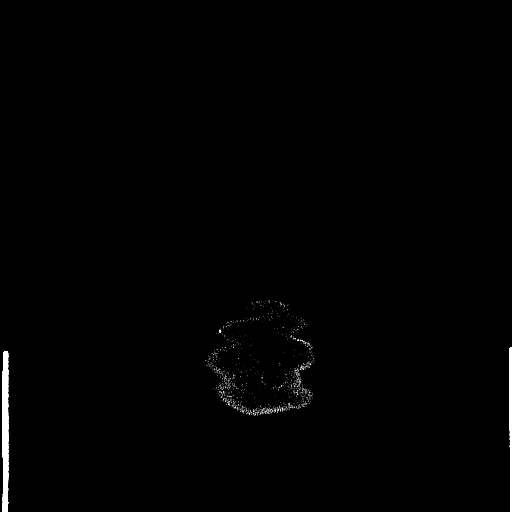
[im 81/88  bone]
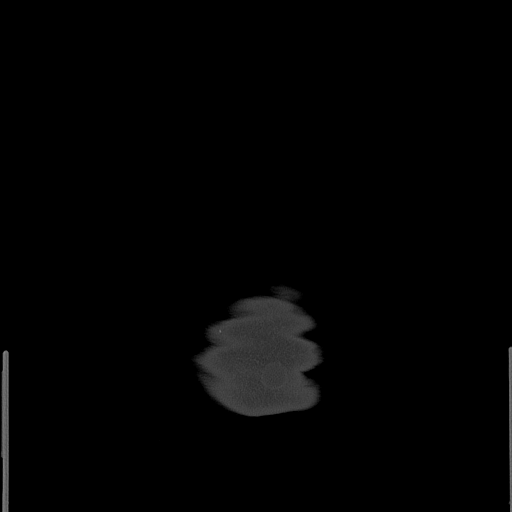

[Series 5: head 3.0 mpr cor · coronal · 0.34mm/px · 3 of 74 slices shown]
[im 25/74  brain]
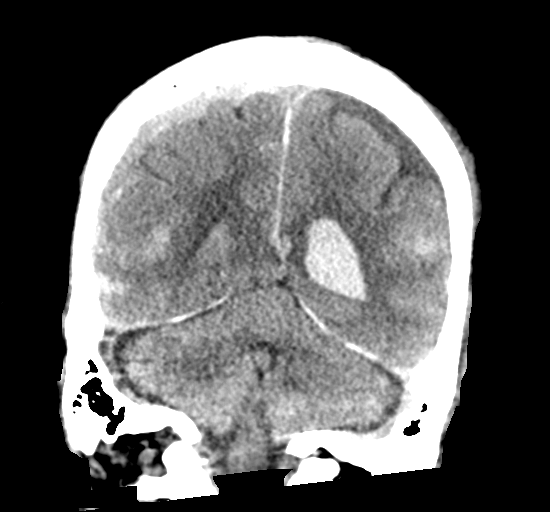
[im 33/74  brain]
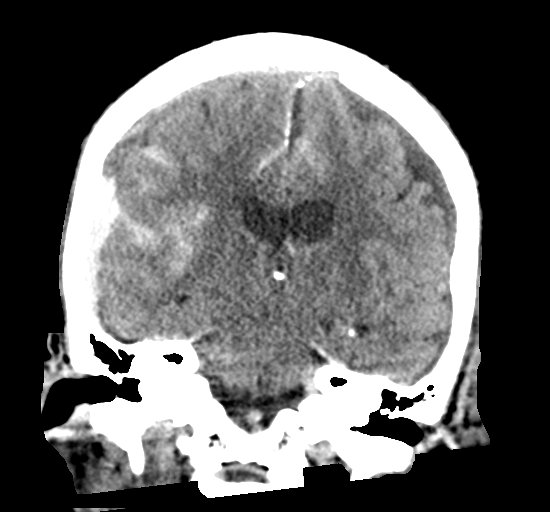
[im 41/74  brain]
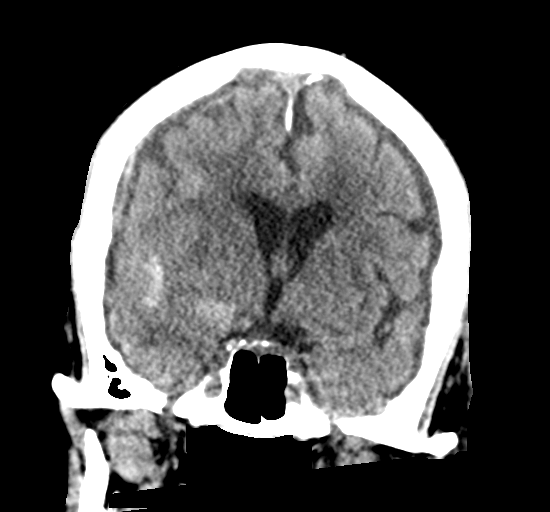

[Series 6: head 3.0 mpr sag · sagittal · 0.34mm/px · 3 of 67 slices shown]
[im 28/67  brain]
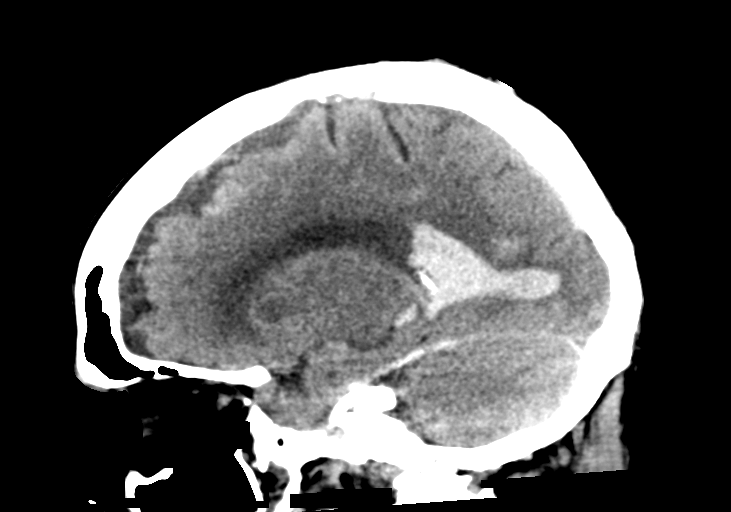
[im 34/67  brain]
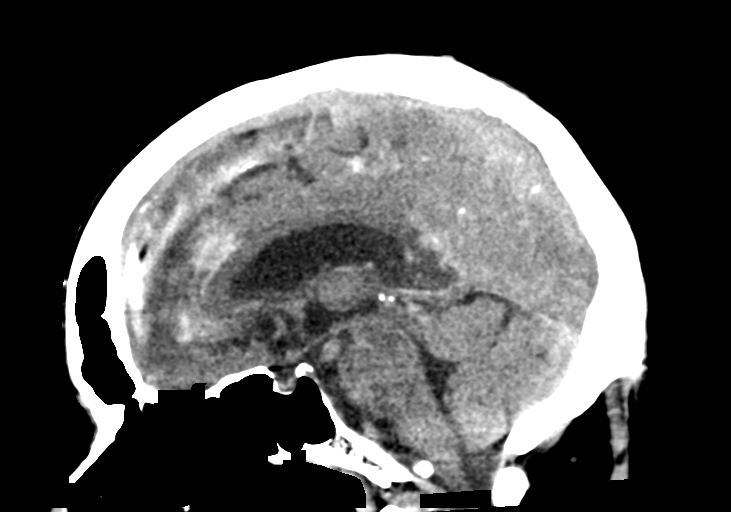
[im 39/67  brain]
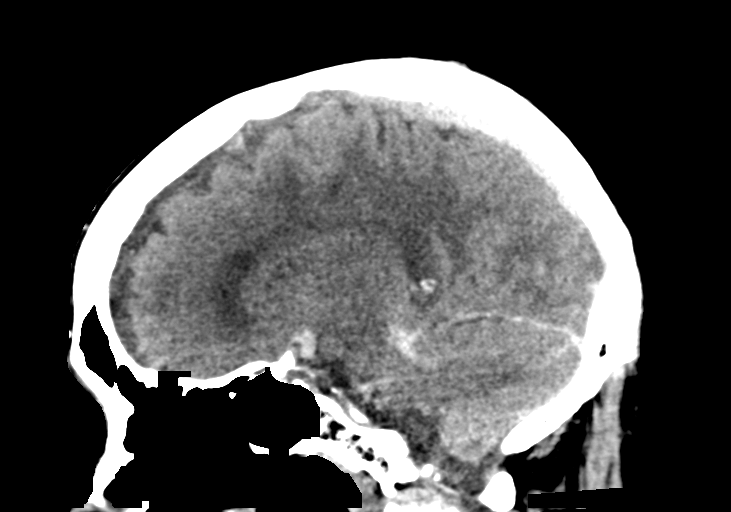

[15 of 47 positions shown; findings below may reference images not displayed]

FINDINGS: Brain: There has been marked worsening of intracranial hemorrhage
with increased volume of subarachnoid blood over both convexities
but greatest in the right sylvian fissure and posterior left lateral
ventricle. There is no herniation. Mild leftward midline shift
measures 4 mm. There is also a subdural hematoma over the posterior
right convexity that is a new finding.

Vascular: There is calcific atherosclerosis of the skull base
arteries.

Skull: Normal

Sinuses/Orbits: No acute finding.

Other: None.
IMPRESSION: 1. Marked worsening of intracranial hemorrhage with increased volume
of subarachnoid blood over both convexities but greatest in the
right Sylvian fissure and posterior left lateral ventricle.
2. New subdural hematoma over the posterior right convexity.
3. 4 mm leftward midline shift.

Critical Value/emergent results were called by telephone at the time
of interpretation on 11/19/2020 at [DATE] to provider ARNOL VARKEY,
NP , who verbally acknowledged these results.

## 2021-04-26 ENCOUNTER — Ambulatory Visit: Payer: Medicare Other | Admitting: Cardiovascular Disease
